# Patient Record
Sex: Female | Born: 1960 | Race: White | Hispanic: No | Marital: Married | Smoking: Current every day smoker
Health system: Southern US, Community
[De-identification: ages and names within clinical notes are randomized; demographics above are authoritative.]

## PROBLEM LIST (undated history)

## (undated) DIAGNOSIS — M179 Osteoarthritis of knee, unspecified: Secondary | ICD-10-CM

## (undated) DIAGNOSIS — I493 Ventricular premature depolarization: Secondary | ICD-10-CM

## (undated) DIAGNOSIS — M1710 Unilateral primary osteoarthritis, unspecified knee: Secondary | ICD-10-CM

## (undated) DIAGNOSIS — F17200 Nicotine dependence, unspecified, uncomplicated: Secondary | ICD-10-CM

## (undated) DIAGNOSIS — T4145XA Adverse effect of unspecified anesthetic, initial encounter: Secondary | ICD-10-CM

## (undated) DIAGNOSIS — G4700 Insomnia, unspecified: Secondary | ICD-10-CM

## (undated) DIAGNOSIS — M1830 Unilateral post-traumatic osteoarthritis of first carpometacarpal joint, unspecified hand: Secondary | ICD-10-CM

## (undated) DIAGNOSIS — F32A Depression, unspecified: Secondary | ICD-10-CM

## (undated) DIAGNOSIS — F329 Major depressive disorder, single episode, unspecified: Secondary | ICD-10-CM

## (undated) DIAGNOSIS — Z972 Presence of dental prosthetic device (complete) (partial): Secondary | ICD-10-CM

## (undated) DIAGNOSIS — N951 Menopausal and female climacteric states: Secondary | ICD-10-CM

## (undated) DIAGNOSIS — J209 Acute bronchitis, unspecified: Secondary | ICD-10-CM

## (undated) DIAGNOSIS — F419 Anxiety disorder, unspecified: Secondary | ICD-10-CM

## (undated) DIAGNOSIS — M2241 Chondromalacia patellae, right knee: Secondary | ICD-10-CM

## (undated) DIAGNOSIS — K047 Periapical abscess without sinus: Secondary | ICD-10-CM

## (undated) DIAGNOSIS — T8859XA Other complications of anesthesia, initial encounter: Secondary | ICD-10-CM

## (undated) HISTORY — DX: Anxiety disorder, unspecified: F41.9

## (undated) HISTORY — DX: Depression, unspecified: F32.A

## (undated) HISTORY — DX: Nicotine dependence, unspecified, uncomplicated: F17.200

## (undated) HISTORY — DX: Acute bronchitis, unspecified: J20.9

## (undated) HISTORY — DX: Unilateral post-traumatic osteoarthritis of first carpometacarpal joint, unspecified hand: M18.30

## (undated) HISTORY — DX: Major depressive disorder, single episode, unspecified: F32.9

## (undated) HISTORY — DX: Ventricular premature depolarization: I49.3

## (undated) HISTORY — DX: Insomnia, unspecified: G47.00

---
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
## 2005-04-09 HISTORY — PX: CHOLECYSTECTOMY: SHX55

## 2008-09-07 ENCOUNTER — Encounter: Payer: Self-pay | Admitting: Family Medicine

## 2008-09-07 LAB — CONVERTED CEMR LAB

## 2009-04-09 ENCOUNTER — Encounter: Payer: Self-pay | Admitting: Family Medicine

## 2009-06-14 ENCOUNTER — Encounter: Payer: Self-pay | Admitting: Cardiology

## 2009-07-19 ENCOUNTER — Encounter: Payer: Self-pay | Admitting: Cardiology

## 2009-08-11 ENCOUNTER — Encounter: Payer: Self-pay | Admitting: Cardiology

## 2009-11-28 DIAGNOSIS — R002 Palpitations: Secondary | ICD-10-CM

## 2009-11-29 DIAGNOSIS — F341 Dysthymic disorder: Secondary | ICD-10-CM

## 2009-11-30 ENCOUNTER — Encounter: Payer: Self-pay | Admitting: Cardiology

## 2009-11-30 ENCOUNTER — Ambulatory Visit: Payer: Self-pay | Admitting: Cardiology

## 2009-11-30 DIAGNOSIS — F17200 Nicotine dependence, unspecified, uncomplicated: Secondary | ICD-10-CM

## 2009-11-30 DIAGNOSIS — R072 Precordial pain: Secondary | ICD-10-CM | POA: Insufficient documentation

## 2009-11-30 HISTORY — DX: Nicotine dependence, unspecified, uncomplicated: F17.200

## 2009-12-06 ENCOUNTER — Encounter: Payer: Self-pay | Admitting: Cardiovascular Disease

## 2009-12-20 ENCOUNTER — Ambulatory Visit: Payer: Self-pay | Admitting: Diagnostic Radiology

## 2009-12-20 ENCOUNTER — Emergency Department (HOSPITAL_BASED_OUTPATIENT_CLINIC_OR_DEPARTMENT_OTHER): Admission: EM | Admit: 2009-12-20 | Discharge: 2009-12-20 | Payer: Self-pay | Admitting: Emergency Medicine

## 2009-12-23 ENCOUNTER — Encounter: Payer: Self-pay | Admitting: Family Medicine

## 2010-01-26 ENCOUNTER — Telehealth (INDEPENDENT_AMBULATORY_CARE_PROVIDER_SITE_OTHER): Payer: Self-pay | Admitting: *Deleted

## 2010-01-27 ENCOUNTER — Ambulatory Visit (HOSPITAL_COMMUNITY): Admission: RE | Admit: 2010-01-27 | Discharge: 2010-01-27 | Payer: Self-pay | Admitting: Cardiology

## 2010-01-27 ENCOUNTER — Ambulatory Visit: Payer: Self-pay

## 2010-01-27 ENCOUNTER — Encounter: Payer: Self-pay | Admitting: Cardiology

## 2010-01-27 ENCOUNTER — Ambulatory Visit: Payer: Self-pay | Admitting: Cardiovascular Disease

## 2010-02-10 ENCOUNTER — Encounter: Payer: Self-pay | Admitting: Family Medicine

## 2010-02-21 ENCOUNTER — Encounter: Payer: Self-pay | Admitting: Family Medicine

## 2010-03-15 ENCOUNTER — Encounter: Payer: Self-pay | Admitting: Cardiology

## 2010-03-15 ENCOUNTER — Ambulatory Visit: Payer: Self-pay | Admitting: Cardiology

## 2010-03-15 DIAGNOSIS — I4949 Other premature depolarization: Secondary | ICD-10-CM

## 2010-04-12 ENCOUNTER — Encounter: Payer: Self-pay | Admitting: Family Medicine

## 2010-04-19 ENCOUNTER — Ambulatory Visit: Admit: 2010-04-19 | Payer: Self-pay | Admitting: Cardiology

## 2010-05-05 ENCOUNTER — Encounter: Payer: Self-pay | Admitting: Family Medicine

## 2010-05-09 NOTE — Letter (Signed)
Summary: Generic Letter  Architectural technologist, Main Office  [address]   [place], [place] [address]   Phone: [phone]  Fax: [phone]        December 06, 2009 MRN: [id number]    Andrea Bowman [address] [place], [place]  [address]    Dear [name],        I am unable to reach you by phone. I wanted to let you know the blood work we checked was normal. We checked your sodium, potassium, kidney function and magnesium, they are all normal. Please call with any questions or concerns.    Sincerely,  Deliah Goody, RN/Dr Olga Millers

## 2010-05-09 NOTE — Progress Notes (Signed)
Summary: Stress Echo Pre-Procedure  Phone Note Outgoing Call   Call placed by: Antionette Char RN,  January 26, 2010 4:54 PM Call placed to: Patient Reason for Call: Confirm/change Appt Summary of Call: Left message on answering machine regarding Stress Echo instructions.

## 2010-05-09 NOTE — Letter (Signed)
Summary: [facility] Office Visit Note   [facility] Office Visit Note   Imported By: Roderic Ovens 12/07/2009 11:50:41  _____________________________________________________________________  External Attachment:    Type:   Image     Comment:   External Document

## 2010-05-09 NOTE — Cardiovascular Report (Signed)
Summary: Arrhythmia Monitoring Summary Report   Arrhythmia Monitoring Summary Report   Imported By: Roderic Ovens 12/07/2009 16:01:08  _____________________________________________________________________  External Attachment:    Type:   Image     Comment:   External Document

## 2010-05-09 NOTE — Letter (Signed)
Summary: [facility] Office Visit Note   [facility] Office Visit Note   Imported By: Roderic Ovens 12/07/2009 11:48:38  _____________________________________________________________________  External Attachment:    Type:   Image     Comment:   External Document

## 2010-05-09 NOTE — Assessment & Plan Note (Signed)
Summary: [facility]   Visit Type:  Initial Consult  CC:  irregular heart beat.  History of Present Illness: 50 year old female for evaluation of palpitations and arrhythmia. Patient apparently developed palpitations early this year. She was seen in [facility] [place] and had an exercise treadmill. She completed 9 minutes on the Bruce protocol and there was one PVC but no ST changes were noted. Apparently a Holter monitor and event monitor showed PVCs. Patient states that since she has had palpitations they are a "pause". There is associated tightness in her neck but there is no shortness of breath or syncope. She was placed on a beta blocker in [place] with some improvement but she continues to have symptoms. The patient also describes chest tightness with more extreme activities relieved with rest. She states it feels like "I can't take a deep breath". It resolves with rest. It does not occur with routine activities. There is no orthopnea, PND, pedal edema. There is no history of syncope.  Current Medications (verified): 1)  Metoprolol Succinate 50 Mg Xr24h-Tab (Metoprolol Succinate) .... Take One Tablet By Mouth Daily 2)  Clonazepam 0.5 Mg Tbdp (Clonazepam) .... Take 1 Tablet By Mouth Three Times A Day  Allergies (verified): No Known Drug Allergies  Past History:  Past Medical History: PVCs ANXIETY DEPRESSION   Past Surgical History: Chloecystectomy  Family History: Reviewed history from 11/28/2009 and no changes required. Father:deceased CHF Mother:breast cancer Siblings depression No premature CAD No sudden death  Social History: Reviewed history from 11/28/2009 and no changes required. Married  Tobacco Use - Yes.  Alcohol Use - no  Review of Systems       no fevers or chills, productive cough, hemoptysis, dysphasia, odynophagia, melena, hematochezia, dysuria, hematuria, rash, seizure activity, orthopnea, PND, pedal edema, claudication. Remaining systems are  negative.   Vital Signs:  Patient profile:   50 year old female Height:      67 inches Weight:      196.75 pounds BMI:     30.93 Pulse rate:   70 / minute Resp:     18 per minute BP sitting:   124 / 78  (right arm) Cuff size:   large  Vitals Entered By: Vikki Ports (November 30, 2009 3:55 PM)  Physical Exam  General:  Well developed/well nourished in NAD Skin warm/dry; tatoos Patient not depressed No peripheral clubbing Back-normal HEENT-normal/normal eyelids Neck supple/normal carotid upstroke bilaterally; no bruits; no JVD; no thyromegaly chest - CTA/ normal expansion CV - RRR/normal S1 and S2; no murmurs, rubs or gallops;  PMI nondisplaced Abdomen -NT/ND, no HSM, no mass, + bowel sounds, no bruit 2+ femoral pulses, no bruits Ext-no edema, chords, 2+ DP Neuro-grossly nonfocal     EKG  Procedure date:  11/30/2009  Findings:      Normal sinus rhythm at a rate of 63. Axis normal. No ST changes.  Impression & Recommendations:  Problem # 1:  PALPITATIONS (ICD-785.1) Previous evaluation has revealed PVCs. I explained that in the setting of normal LV function these are benign. I will check electrolytes and a TSH. I will plan to proceed with a stress echocardiogram to quantitate LV function. This will also want to exclude ischemia. She does describe chest tightness with more extreme activities relieved with rest. There may be a pulmonary component but we need to exclude cardiac contribution. We will continue with her beta blocker. I have also asked her to avoid caffeine. Her updated medication list for this problem includes:    Metoprolol  Succinate 50 Mg Xr24h-tab (Metoprolol succinate) .Marland Kitchen... Take one tablet by mouth daily  Orders: T-Basic Metabolic Panel [phone]) T-Magnesium [phone]) T-TSH [phone])  Problem # 2:  CHEST PAIN, PRECORDIAL (ICD-786.51) As per #1 we will plan a stress echocardiogram. Her updated medication list for this problem includes:     Metoprolol Succinate 50 Mg Xr24h-tab (Metoprolol succinate) .Marland Kitchen... Take one tablet by mouth daily  Orders: Stress Echo (Stress Echo)  Problem # 3:  TOBACCO ABUSE (ICD-305.1) Patient counseled on discontinuing for between 3-10 minutes.  Problem # 4:  ANXIETY DEPRESSION (ICD-300.4) Management per primary care.  Patient Instructions: 1)  Your physician recommends that you schedule a follow-up appointment in: 6 MONTHS 2)  Your physician has requested that you have a stress echocardiogram. For further information please visit [email or web address]  Please follow instruction sheet as given.

## 2010-05-09 NOTE — Assessment & Plan Note (Signed)
Summary: [facility]   Visit Type:  Follow-up  CC:  palpitations.  History of Present Illness: 50 year old female I saw in August of 2011 for evaluation of palpitations and arrhythmia. Patient apparently developed palpitations early this year. She was seen in [facility] [place] and had an exercise treadmill. She completed 9 minutes on the Bruce protocol and there was one PVC but no ST changes were noted. Apparently a Holter monitor and event monitor showed PVCs.  She was placed on a beta blocker in [place] with some improvement but she continued to have symptoms. We checked electrolytes and TSH which were normal. She was also complaining of chest tightness with extreme activities. A stress echocardiogram was therefore performed in October of 2011 and was normal. Since I last saw her she continues to have palpitations. They are described as a "flop". They are frequent and associated with dizziness. She denies dyspnea on exertion, orthopnea, PND, pedal edema or chest pain. Her palpitations are not sustained.   Current Medications (verified): 1)  Metoprolol Succinate 50 Mg Xr24h-Tab (Metoprolol Succinate) .... Take One Tablet By Mouth Daily 2)  Clonazepam 0.5 Mg Tbdp (Clonazepam) .... Take 1 Tablet By Mouth Three Times A Day 3)  Zoloft 25 Mg Tabs (Sertraline Hcl) .... Take 1 Tablet By Mouth Once A Day  Allergies (verified): 1)  ! Prednisone  Past History:  Past Medical History: Reviewed history from 11/30/2009 and no changes required. PVCs ANXIETY DEPRESSION   Past Surgical History: Reviewed history from 11/30/2009 and no changes required. Chloecystectomy  Social History: Reviewed history from 11/30/2009 and no changes required. Married  Tobacco Use - Yes.  Alcohol Use - no  Review of Systems       no fevers or chills, productive cough, hemoptysis, dysphasia, odynophagia, melena, hematochezia, dysuria, hematuria, rash, seizure activity, orthopnea, PND, pedal edema, claudication.  Remaining systems are negative.   Vital Signs:  Patient profile:   50 year old female Height:      67 inches Weight:      197.75 pounds BMI:     31.08 Pulse rate:   70 / minute Pulse rhythm:   irregular Resp:     18 per minute BP sitting:   110 / 70  (right arm) Cuff size:   large  Vitals Entered By: Vikki Ports (March 15, 2010 3:25 PM)  Physical Exam  General:  Well-developed well-nourished in no acute distress.  Skin is warm and dry.  HEENT is normal.  Neck is supple. No thyromegaly.  Chest is clear to auscultation with normal expansion.  Cardiovascular exam is regular rate and rhythm.  Abdominal exam nontender or distended. No masses palpated. Extremities show no edema. neuro grossly intact    EKG  Procedure date:  03/15/2010  Findings:      Sinus rhythm at a rate of 70. Occasional PVC.  Impression & Recommendations:  Problem # 1:  PALPITATIONS (ICD-785.1) Patient continues to have palpitations. She is taking Toprol 25 mg p.o. daily as 50 mg caused diarrhea. I will discontinue her Toprol and begin atenolol 25 mg p.o. b.i.d. to see if she tolerates this better. If her palpitations continue despite higher doses of beta blockade then we will need to consider flecainide. Note her LV function is normal. Previous TSH and electrolytes normal. Her updated medication list for this problem includes:    Metoprolol Succinate 50 Mg Xr24h-tab (Metoprolol succinate) .Marland Kitchen... Take one tablet by mouth daily  Her updated medication list for this problem includes:  Atenolol 25 Mg Tabs (Atenolol) .Marland Kitchen... Take one tablet by mouth two times a day  Problem # 2:  PREMATURE VENTRICULAR CONTRACTIONS (ICD-427.69) As per above. Her electrocardiogram showed a PVC today and she did have palpitations at that time. Her updated medication list for this problem includes:    Atenolol 25 Mg Tabs (Atenolol) .Marland Kitchen... Take one tablet by mouth two times a day  Problem # 3:  TOBACCO ABUSE  (ICD-305.1) Patient counseled on discontinuing.  Patient Instructions: 1)  Your physician has recommended you make the following change in your medication: STOP TOPROL 2)  START ATENOLOL 25MG  ONE TABLET TWICE DAILY 3)  Your physician recommends that you schedule a follow-up appointment in: 4 WEEKS Prescriptions: ATENOLOL 25 MG TABS (ATENOLOL) Take one tablet by mouth two times a day  #60 x 12   Entered by:   Deliah Goody, RN   Authorized by:   Ferman Hamming, MD, [facility]   Signed by:   Deliah Goody, RN on 03/15/2010   Method used:   Electronically to        [facility] [facility] [phone][facility] (retail)       [address]       [place], [place]  [address]       Ph: [phone] or [phone]       Fax: [phone]   RxID:   [id number]

## 2010-05-12 ENCOUNTER — Encounter: Payer: Self-pay | Admitting: Family Medicine

## 2010-05-12 ENCOUNTER — Institutional Professional Consult (permissible substitution) (INDEPENDENT_AMBULATORY_CARE_PROVIDER_SITE_OTHER): Payer: BC Managed Care – PPO | Admitting: Family Medicine

## 2010-05-12 DIAGNOSIS — Z9189 Other specified personal risk factors, not elsewhere classified: Secondary | ICD-10-CM | POA: Insufficient documentation

## 2010-05-12 DIAGNOSIS — R002 Palpitations: Secondary | ICD-10-CM

## 2010-05-12 DIAGNOSIS — F17200 Nicotine dependence, unspecified, uncomplicated: Secondary | ICD-10-CM

## 2010-05-12 DIAGNOSIS — J301 Allergic rhinitis due to pollen: Secondary | ICD-10-CM | POA: Insufficient documentation

## 2010-05-12 DIAGNOSIS — N959 Unspecified menopausal and perimenopausal disorder: Secondary | ICD-10-CM | POA: Insufficient documentation

## 2010-05-12 DIAGNOSIS — Z8619 Personal history of other infectious and parasitic diseases: Secondary | ICD-10-CM | POA: Insufficient documentation

## 2010-05-12 DIAGNOSIS — Z87448 Personal history of other diseases of urinary system: Secondary | ICD-10-CM | POA: Insufficient documentation

## 2010-05-12 DIAGNOSIS — E559 Vitamin D deficiency, unspecified: Secondary | ICD-10-CM | POA: Insufficient documentation

## 2010-05-12 DIAGNOSIS — F341 Dysthymic disorder: Secondary | ICD-10-CM

## 2010-05-15 ENCOUNTER — Telehealth: Payer: Self-pay | Admitting: Family Medicine

## 2010-05-17 NOTE — Assessment & Plan Note (Signed)
Summary: patient wants to consult with doctor to establish   Vital Signs:  Patient profile:   50 year old female Menstrual status:  irregular LMP:     05/09/2010 Height:      66.8 inches (169.67 cm) Weight:      194.25 pounds (88.30 kg) O2 Sat:      98 % on Room air Temp:     97.6 degrees F (36.44 degrees C) oral Pulse rate:   57 / minute BP sitting:   106 / 68  (right arm) Cuff size:   large  Vitals Entered By: Josph Macho RMA (May 12, 2010 1:35 PM)  O2 Flow:  Room air CC: Establish new patient/ CF Is Patient Diabetic? No LMP (date): 05/09/2010     Menstrual Status irregular Enter LMP: 05/09/2010 Last PAP Result historical   History of Present Illness: 50 year old Caucasian female who is in today to have his care. She has primarily relocated here from [place] been in town since the summer and is in need of a primary care physician. She reports a long-standing history of depression anxiety and panic disorder but this is generally well-controlled on her low dose sertraline and she uses clonazepam only infrequently. She did have a bronchitis a couple months ago but those symptoms have completely resolved. She does have frequent PVCs often feels that is following with Dr. Jens Som of cardiology and is comfortable with the benign nature of her palpitations at this time. She has no associated symptoms such as chest pain or shortness of breath when they occur. She says overall she feels well other than bronchitis no recent illness, fevers, chills, congestion, cough, GI or GU complaints. She says her last physical exam was over a year ago where she had a Pap and mammogram done in May. She does believe she had a low vitamin D was told to supplement her thousand units daily which she continues to do. Has not had the vitamin D rechecked. She had an episode recently where she has some erythema and bruising over her left thenar prominence no cause was ever found and it did resolve. She's had  no other similar lesions. Did have some lab work run by a Programmer, multimedia and will have those records forwarded. Patient is noting some perimenopausal symptoms over the last several months. She notes her menses which he previously has been regular now irregular. She will have 2 periods one month and then nonfocal month she's also having a lot of hot flashes and night sweats. Some irritability and difficulty with sleeping are also noted  Preventive Screening-Counseling & Management  Alcohol-Tobacco     Smoking Status: current     Smoking Cessation Counseling: YES      Drug Use:  no.    Current Medications (verified): 1)  Atenolol 25 Mg Tabs (Atenolol) .... Take One Tablet By Mouth Two Times A Day 2)  Clonazepam 0.5 Mg Tbdp (Clonazepam) .... Take 1 Tablet By Mouth Three Times A Day 3)  Zoloft 25 Mg Tabs (Sertraline Hcl) .... Take 1 Tablet By Mouth Once A Day  Allergies (verified): 1)  ! Prednisone  Past History:  Past Surgical History: Chloecystectomy childhood abdominal surgery at age 89yo verrucous lesion on right scalp excise, benign  Family History:  No premature CAD No sudden death Father: deceased@76 , CHF, HTN Mother: deceased@50 , breast cancer Siblings:  Brother: 52, depression, anxiety, PUD Sister: 48, A&W, previous h/o depression/anxiety MGM: deceased in 44s, cancer MGF: deceased unknown PGM: deceased in 70s,  unknown causes PGF: deceased in 69s, unknown causes Children: Daughter: 57, A&W several maternal aunts with breast cancer, various ages several paternal uncles with prostate cancer  Social History: Married  Tobacco Use - Yes.  Alcohol Use - no, very special occasions Current Smoker, 1/2 ppd, previously tried Chantix, tried nicotine replacements Drug use-no Occupation: Engineer, production No dietary restrictionsDrug Use:  no Occupation:  employed  Review of Systems       The patient complains of depression.  The patient denies anorexia,  fever, weight loss, weight gain, vision loss, decreased hearing, hoarseness, chest pain, syncope, dyspnea on exertion, peripheral edema, prolonged cough, headaches, hemoptysis, abdominal pain, melena, hematochezia, severe indigestion/heartburn, hematuria, incontinence, genital sores, muscle weakness, suspicious skin lesions, transient blindness, difficulty walking, unusual weight change, abnormal bleeding, and enlarged lymph nodes.    Physical Exam  General:  Well-developed,well-nourished,in no acute distress; alert,appropriate and cooperative throughout examination Head:  Normocephalic and atraumatic without obvious abnormalities. No apparent alopecia or balding. Eyes:  No corneal or conjunctival inflammation noted. EOMI. Perrla. Funduscopic exam benign, without hemorrhages, exudates or papilledema. Vision grossly normal. Ears:  External ear exam shows no significant lesions or deformities.  Otoscopic examination reveals clear canals, tympanic membranes are intact bilaterally without bulging, retraction, inflammation or discharge. Hearing is grossly normal bilaterally. Nose:  External nasal examination shows no deformity or inflammation. Nasal mucosa are pink and moist without lesions or exudates. Mouth:  Oral mucosa and oropharynx without lesions or exudates.   Neck:  No deformities, masses, or tenderness noted. Lungs:  Normal respiratory effort, chest expands symmetrically. Lungs are clear to auscultation, no crackles or wheezes. Heart:  Normal rate and regular rhythm. S1 and S2 normal without gallop, murmur, click, rub or other extra sounds. Abdomen:  Bowel sounds positive,abdomen soft and non-tender without masses, organomegaly or hernias noted. Msk:  No deformity or scoliosis noted of thoracic or lumbar spine.   Pulses:  R and L carotid,dorsalis pedis and posterior tibial pulses are full and equal bilaterally Extremities:  No clubbing, cyanosis, edema, or deformity noted with normal full range  of motion of all joints.   Neurologic:  No cranial nerve deficits noted. Station and gait are normal. Plantar reflexes are down-going bilaterally. DTRs are symmetrical throughout. Sensory, motor and coordinative functions appear intact. Skin:  Intact without suspicious lesions or rashes Cervical Nodes:  No lymphadenopathy noted Psych:  Cognition and judgment appear intact. Alert and cooperative with normal attention span and concentration. No apparent delusions, illusions, hallucinations   Impression & Recommendations:  Problem # 1:  UNSPECIFIED VITAMIN D DEFICIENCY (ICD-268.9) will request old records and review old records then likely order a repeat Vit D level at next visit. Cont vit D 1000iu daily for now  Problem # 2:  PERIMENOPAUSAL SYNDROME (ICD-627.9) Needs 7-8 hours of sleep, regular am exercise, avoid heavy carb meals and do not skip meals. Alternate a fish oil cap every other day with a vit B complex every other day and consider labs at next visit if no improvement.  Problem # 3:  ALLERGIC RHINITIS, SEASONAL (ICD-477.0) No sympcomts yet but may use Cetirizine as needed as needed for new symptoms  Problem # 4:  TOBACCO ABUSE (ICD-305.1)  Orders: Tobacco use cessation intermediate 3-10 minutes (99406) Counselled at length regarding the need to attempt complete cessation, especially given her FH of [place].  Problem # 5:  ANXIETY DEPRESSION (ICD-300.4) Patient reports symptoms are stable, am willing to continue her meds for her at present  doses as long as her symptoms remain stable  Complete Medication List: 1)  Atenolol 25 Mg Tabs (Atenolol) .... Take one tablet by mouth two times a day 2)  Clonazepam 0.5 Mg Tbdp (Clonazepam) .... Take 1 tablet by mouth three times a day 3)  Zoloft 25 Mg Tabs (Sertraline hcl) .... Take 1 tablet by mouth once a day 4)  Vitamin D 1000 Unit Tabs (Cholecalciferol) .Marland Kitchen.. 1 tab by mouth daily  Patient Instructions: 1)  Please schedule a follow-up  appointment in 1 month GYN 2)  Release of Records [facility] 3)  Start a fish oil cap every other day and a Vitamin B complex every other day, get 7-8 hours sleep nightly, add regular exercise 4)  Tobacco is very bad for your health and your loved ones ! You should stop smoking !  5)  Stop smoking tips: Choose a quit date. Cut down before the quit date. Decide what you will do as a substitute when you feel the urge to smoke(gum, toothpick, exercise).  6)  It is important that you exercise reguarly at least 20 minutes 5 times a week. If you develop chest pain, have severe difficulty breathing, or feel very tired, stop exercising immediately and seek medical attention.    Orders Added: 1)  Tobacco use cessation intermediate 3-10 minutes [99406] 2)  New Patient Level IV [40981]    Preventive Care Screening  Mammogram:    Date:  04/09/2009    Results:  historical   Pap Smear:    Date:  04/09/2009    Results:  historical

## 2010-05-22 ENCOUNTER — Encounter: Payer: Self-pay | Admitting: *Deleted

## 2010-05-25 ENCOUNTER — Other Ambulatory Visit: Payer: Self-pay | Admitting: Family Medicine

## 2010-05-25 DIAGNOSIS — Z1231 Encounter for screening mammogram for malignant neoplasm of breast: Secondary | ICD-10-CM

## 2010-05-25 NOTE — Progress Notes (Signed)
----   Converted from flag ---- ---- 05/12/2010 3:08 PM, Danise Edge MD wrote: Help this lady wants her screening MGM in [place], any suggestions? ------------------------------ SW patient, gave her [facility] scheduler phone [phone], pt can set up screening Mammo at any of their locations, [place] included-Diane

## 2010-05-31 NOTE — Letter (Signed)
Summary: 2006-2011  2006-2011   Imported By: Lester Galion 05/23/2010 07:17:09  _____________________________________________________________________  External Attachment:    Type:   Image     Comment:   External Document

## 2010-06-07 ENCOUNTER — Encounter: Payer: Self-pay | Admitting: Cardiology

## 2010-06-07 ENCOUNTER — Ambulatory Visit (INDEPENDENT_AMBULATORY_CARE_PROVIDER_SITE_OTHER): Payer: BC Managed Care – PPO | Admitting: Cardiology

## 2010-06-07 DIAGNOSIS — R002 Palpitations: Secondary | ICD-10-CM

## 2010-06-09 ENCOUNTER — Other Ambulatory Visit (HOSPITAL_COMMUNITY)
Admission: RE | Admit: 2010-06-09 | Discharge: 2010-06-09 | Disposition: A | Discharge status: Home / Self Care | Payer: BC Managed Care – PPO | Source: Ambulatory Visit | Attending: Family Medicine | Admitting: Family Medicine

## 2010-06-09 ENCOUNTER — Ambulatory Visit (INDEPENDENT_AMBULATORY_CARE_PROVIDER_SITE_OTHER): Payer: BC Managed Care – PPO | Admitting: Family Medicine

## 2010-06-09 ENCOUNTER — Other Ambulatory Visit: Payer: Self-pay | Admitting: Family Medicine

## 2010-06-09 ENCOUNTER — Encounter: Payer: Self-pay | Admitting: Family Medicine

## 2010-06-09 DIAGNOSIS — E559 Vitamin D deficiency, unspecified: Secondary | ICD-10-CM

## 2010-06-09 DIAGNOSIS — Z01419 Encounter for gynecological examination (general) (routine) without abnormal findings: Secondary | ICD-10-CM | POA: Insufficient documentation

## 2010-06-09 DIAGNOSIS — R002 Palpitations: Secondary | ICD-10-CM

## 2010-06-09 DIAGNOSIS — N959 Unspecified menopausal and perimenopausal disorder: Secondary | ICD-10-CM

## 2010-06-09 DIAGNOSIS — J301 Allergic rhinitis due to pollen: Secondary | ICD-10-CM

## 2010-06-12 ENCOUNTER — Encounter: Payer: Self-pay | Admitting: *Deleted

## 2010-06-15 NOTE — Assessment & Plan Note (Signed)
Summary: F/U 1 MONTH   Vital Signs:  Patient profile:   50 year old female Menstrual status:  irregular Height:      66.8 inches (169.67 cm) Weight:      199.50 pounds (90.68 kg) BMI:     31.55 O2 Sat:      93 % on Room air Temp:     98.1 degrees F (36.72 degrees C) oral Pulse rate:   62 / minute BP sitting:   110 / 76  (right arm) Cuff size:   large  Vitals Entered By: Josph Macho RMA (June 09, 2010 8:28 AM)  O2 Flow:  Room air CC: Pap smear/ CF Is Patient Diabetic? No Menarche (age onset years): 13   Menses interval (days): variable Menstrual flow (days): 6 days Last PAP Result historical   Current Medications (verified): 1)  Atenolol 25 Mg Tabs (Atenolol) .... Take Two Tablets Every Am and One Tablet Every Pm 2)  Clonazepam 0.5 Mg Tbdp (Clonazepam) .... Take 1 Tablet By Mouth Three Times A Day. As Needed ' 3)  Zoloft 25 Mg Tabs (Sertraline Hcl) .... Take 1 Tablet By Mouth Once A Day 4)  Vitamin D 1000 Unit Tabs (Cholecalciferol) .Marland Kitchen.. 1 Tab By Mouth Daily 5)  Fish Oil 1000 Mg Caps (Omega-3 Fatty Acids) .... Take 1 Capsule By Mouth Once A Day 6)  B-100  Tabs (Vitamins-Lipotropics) .... Every Other Day  Allergies (verified): 1)  ! Prednisone  Past History:  Past medical history reviewed for relevance to current acute and chronic problems. Social history (including risk factors) reviewed for relevance to current acute and chronic problems.  Past Medical History: Reviewed history from 11/30/2009 and no changes required. PVCs ANXIETY DEPRESSION   Social History: Reviewed history from 05/12/2010 and no changes required. Married  Tobacco Use - Yes.  Alcohol Use - no, very special occasions Current Smoker, 1/2 ppd, previously tried Chantix, tried nicotine replacements Drug use-no Occupation: Transport planner belt No dietary restrictions  Review of Systems      See HPI  Physical Exam  General:  Well-developed,well-nourished,in no acute  distress; alert,appropriate and cooperative throughout examination Head:  Normocephalic and atraumatic without obvious abnormalities. No apparent alopecia or balding. Mouth:  Oral mucosa and oropharynx without lesions or exudates.  Teeth in good repair. Neck:  No deformities, masses, or tenderness noted. Breasts:  No mass, nodules, thickening, tenderness, bulging, retraction, inflamation, nipple discharge or skin changes noted.   Lungs:  Normal respiratory effort, chest expands symmetrically. Lungs are clear to auscultation, no crackles or wheezes. Heart:  Normal rate and regular rhythm. S1 and S2 normal without gallop, murmur, click, rub or other extra sounds. Abdomen:  Bowel sounds positive,abdomen soft and non-tender without masses, organomegaly or hernias noted. Rectal:  no external abnormalities and no hemorrhoids.   Genitalia:  Normal introitus for age, no external lesions, no vaginal discharge, mucosa pink and moist, no vaginal or cervical lesions, no vaginal atrophy, no friaility or hemorrhage, normal uterus size and position, no adnexal masses or tenderness Extremities:  No clubbing, cyanosis, edema, or deformity noted with normal full range of motion of all joints.   Cervical Nodes:  No lymphadenopathy noted Psych:  Cognition and judgment appear intact. Alert and cooperative with normal attention span and concentration. No apparent delusions, illusions, hallucinations   Impression & Recommendations:  Problem # 1:  PERIMENOPAUSAL SYNDROME (ICD-627.9) avoid simple carbs, avoid hot liquids, caffeine and alcohol, start regular exercise in am and consider 1 cap  of evening primrose oil if symptoms persist. Pap smear taken today  Problem # 2:  UNSPECIFIED VITAMIN D DEFICIENCY (ICD-268.9) Reviewed old labs from previous pmd Vit d level was 27, encouraged her to start Citracal daily recheck vit d with next blood draw.  Problem # 3:  PALPITATIONS (ICD-785.1)  Her updated medication list for  this problem includes:    Atenolol 25 Mg Tabs (Atenolol) .Marland Kitchen... Take two tablets every am and one tablet every pm Improved with addition of Atenolol, was seen by cardiology yesterday and we will continue to titrate dosing. Report worsening symptoms  Problem # 4:  ALLERGIC RHINITIS, SEASONAL (ICD-477.0) No symptoms at present, may use Zyrtec daily  Complete Medication List: 1)  Atenolol 25 Mg Tabs (Atenolol) .... Take two tablets every am and one tablet every pm 2)  Clonazepam 0.5 Mg Tbdp (Clonazepam) .... Take 1 tablet by mouth three times a day. as needed ' 3)  Zoloft 25 Mg Tabs (Sertraline hcl) .... Take 1 tablet by mouth once a day 4)  Vitamin D 1000 Unit Tabs (Cholecalciferol) .Marland Kitchen.. 1 tab by mouth daily 5)  Fish Oil 1000 Mg Caps (Omega-3 fatty acids) .... Take 1 capsule by mouth once a day 6)  B-100 Tabs (Vitamins-lipotropics) .... Every other day  Patient Instructions: 1)  Please schedule a follow-up appointment in 3  months 2)  FLP, liver, renal, cbc, vit d, tsh, call the week before the visit for lab appt. 3)  Minimize caffein   Orders Added: 1)  Est. Patient Level IV [21308]       WOMEN'S HEALTH      G: 2  P: 1  Living: 1  Term: 1             Menarche age: 79.             Menstrual frequency: variable.  Menstrual duration: 6 days.  Flow: moderate.       Last PAP smear: Results: historical      Last Mammogram: Results: historical      Comments on pregnancies: G1P1, s/p svd s/p 1 miscarriage required d/c no abnl pap abnl mgm never required a bx,

## 2010-06-15 NOTE — Assessment & Plan Note (Signed)
Summary: [facility]   Visit Type:  Follow-up  CC:  No complaints.  History of Present Illness: 50 year old female I saw in August of 2011 for evaluation of palpitations and arrhythmia. Patient apparently developed palpitations early this year. She was seen in [facility] [place] and had an exercise treadmill. She completed 9 minutes on the Bruce protocol and there was one PVC but no ST changes were noted. Apparently a Holter monitor and event monitor showed PVCs.  She was placed on a beta blocker in [place] with some improvement but she continued to have symptoms. We checked electrolytes and TSH which were normal. She was also complaining of chest tightness with extreme activities. A stress echocardiogram was therefore performed in October of 2011 and was normal. When I last saw her in December of 2011 I discontinued her Toprol secondary to diarrhea and begin atenolol. Since then, her palpitations have improved. She will occasionally feel the in the late afternoon. They continue to be described as a skip. They are not sustained. There is no dyspnea, chest pain or syncope.  Current Medications (verified): 1)  Atenolol 25 Mg Tabs (Atenolol) .... Take One Tablet By Mouth Two Times A Day 2)  Clonazepam 0.5 Mg Tbdp (Clonazepam) .... Take 1 Tablet By Mouth Three Times A Day. As Needed ' 3)  Zoloft 25 Mg Tabs (Sertraline Hcl) .... Take 1 Tablet By Mouth Once A Day 4)  Vitamin D 1000 Unit Tabs (Cholecalciferol) .Marland Kitchen.. 1 Tab By Mouth Daily 5)  Fish Oil 1000 Mg Caps (Omega-3 Fatty Acids) .... Take 1 Capsule By Mouth Once A Day 6)  B-100  Tabs (Vitamins-Lipotropics) .... Every Other Day  Allergies: 1)  ! Prednisone  Past History:  Past Medical History: Reviewed history from 11/30/2009 and no changes required. PVCs ANXIETY DEPRESSION   Past Surgical History: Reviewed history from 05/12/2010 and no changes required. Chloecystectomy childhood abdominal surgery at age 50yo verrucous lesion on right  scalp excise, benign  Social History: Reviewed history from 05/12/2010 and no changes required. Married  Tobacco Use - Yes.  Alcohol Use - no, very special occasions Current Smoker, 1/2 ppd, previously tried Chantix, tried nicotine replacements Drug use-no Occupation: Transport planner belt No dietary restrictions  Review of Systems       no fevers or chills, productive cough, hemoptysis, dysphasia, odynophagia, melena, hematochezia, dysuria, hematuria, rash, seizure activity, orthopnea, PND, pedal edema, claudication. Remaining systems are negative.   Vital Signs:  Patient profile:   50 year old female Menstrual status:  irregular Height:      66.8 inches Weight:      198.50 pounds BMI:     31.39 Pulse rate:   57 / minute Pulse rhythm:   regular Resp:     18 per minute BP sitting:   110 / 69  (right arm) Cuff size:   large  Vitals Entered By: Vikki Ports (June 07, 2010 2:41 PM)  Physical Exam  General:  Well-developed well-nourished in no acute distress.  Skin is warm and dry.  HEENT is normal.  Neck is supple. No thyromegaly.  Chest is clear to auscultation with normal expansion.  Cardiovascular exam is regular rate and rhythm.  Abdominal exam nontender or distended. No masses palpated. Extremities show no edema. neuro grossly intact    EKG  Procedure date:  06/07/2010  Findings:      Sinus with no ST changes.  Impression & Recommendations:  Problem # 1:  PREMATURE VENTRICULAR CONTRACTIONS (ICD-427.69) Patient's symptoms have  improved although still having some in the late afternoon. Increase atenolol to 50 mg in the morning and 25 in the evening. Watch blood pressure and if it drops on this dose we will resume 25 b.i.d. Her updated medication list for this problem includes:    Atenolol 25 Mg Tabs (Atenolol) .Marland Kitchen... Take two tablets every am and one tablet every pm  Problem # 2:  TOBACCO ABUSE (ICD-305.1) Patient counseled on  discontinuing.  Problem # 3:  PALPITATIONS (ICD-785.1) As per #1. Her updated medication list for this problem includes:    Atenolol 25 Mg Tabs (Atenolol) .Marland Kitchen... Take two tablets every am and one tablet every pm  Patient Instructions: 1)  Your physician has recommended you make the following change in your medication: INCREASE ATENOLOL 25MG  TWO TABLETS EVERY AM AND ONE TABLET EVERY PM 2)  Your physician wants you to follow-up in: 6 MONTHS  You will receive a reminder letter in the mail two months in advance. If you don't receive a letter, please call our office to schedule the follow-up appointment. Prescriptions: ATENOLOL 25 MG TABS (ATENOLOL) Take two tablets every am and one tablet every pm  #90 x 12   Entered by:   Deliah Goody, RN   Authorized by:   Ferman Hamming, MD, [facility]   Signed by:   Deliah Goody, RN on 06/07/2010   Method used:   [facility]  [address] [phone]ail[facility]       [address]       [place], [place]  [address]       Ph: [phone] or [phone]       Fax: [phone]   RxID:   [id number]

## 2010-06-20 ENCOUNTER — Ambulatory Visit: Payer: BC Managed Care – PPO

## 2010-06-27 ENCOUNTER — Ambulatory Visit
Admission: RE | Admit: 2010-06-27 | Discharge: 2010-06-27 | Disposition: A | Discharge status: Home / Self Care | Payer: BC Managed Care – PPO | Source: Ambulatory Visit | Attending: Family Medicine | Admitting: Family Medicine

## 2010-06-27 DIAGNOSIS — Z1231 Encounter for screening mammogram for malignant neoplasm of breast: Secondary | ICD-10-CM

## 2010-06-27 NOTE — Letter (Signed)
Summary: [facility] @ [facility]   Imported By: Lanelle Bal 06/21/2010 13:42:02  _____________________________________________________________________  External Attachment:    Type:   Image     Comment:   External Document

## 2010-06-27 NOTE — Letter (Signed)
Summary: [facility] @ [facility]   Imported By: Lanelle Bal 06/21/2010 13:39:27  _____________________________________________________________________  External Attachment:    Type:   Image     Comment:   External Document

## 2010-06-27 NOTE — Letter (Signed)
Summary: [facility] @ [facility]   Imported By: Lanelle Bal 06/21/2010 13:42:51  _____________________________________________________________________  External Attachment:    Type:   Image     Comment:   External Document

## 2010-06-27 NOTE — Letter (Signed)
Summary: [facility] @ [facility]   Imported By: Lanelle Bal 06/21/2010 13:41:03  _____________________________________________________________________  External Attachment:    Type:   Image     Comment:   External Document

## 2010-06-30 ENCOUNTER — Other Ambulatory Visit: Payer: Self-pay | Admitting: Family Medicine

## 2010-06-30 DIAGNOSIS — R928 Other abnormal and inconclusive findings on diagnostic imaging of breast: Secondary | ICD-10-CM

## 2010-07-04 ENCOUNTER — Telehealth: Payer: Self-pay | Admitting: Family Medicine

## 2010-07-04 NOTE — Telephone Encounter (Signed)
So MGM showed some calcifications that the imaging center felt needed additional views to further evaluate. She had an appt on 3/23 that she missed, she just needs to call and reschedule because until we get additional views we cannot tell anything further.

## 2010-07-04 NOTE — Telephone Encounter (Signed)
Patient wants a call with her mammogram results.

## 2010-07-05 NOTE — Telephone Encounter (Signed)
Pt informed

## 2010-07-05 NOTE — Telephone Encounter (Signed)
Reviewed [facility], both refer to right sided calcifications, but neither clarifies exactly where and what size they are. I recommend she go for further imaging to be sure it is not serious

## 2010-07-05 NOTE — Telephone Encounter (Signed)
Pt would like to know if this is the same location as her results in [place]?

## 2010-07-10 ENCOUNTER — Ambulatory Visit
Admission: RE | Admit: 2010-07-10 | Discharge: 2010-07-10 | Disposition: A | Discharge status: Home / Self Care | Payer: BC Managed Care – PPO | Source: Ambulatory Visit | Attending: Family Medicine | Admitting: Family Medicine

## 2010-07-10 DIAGNOSIS — R928 Other abnormal and inconclusive findings on diagnostic imaging of breast: Secondary | ICD-10-CM

## 2010-07-27 ENCOUNTER — Telehealth: Payer: Self-pay

## 2010-07-27 ENCOUNTER — Telehealth: Payer: Self-pay | Admitting: Cardiology

## 2010-07-27 MED ORDER — NAPROXEN 500 MG PO TABS: 500.0000 mg | ORAL_TABLET | Freq: Two times a day (BID) | ORAL | Status: DC

## 2010-07-27 MED ORDER — CYCLOBENZAPRINE HCL 10 MG PO TABS: 10.0000 mg | ORAL_TABLET | Freq: Three times a day (TID) | ORAL | Status: DC | PRN

## 2010-07-27 NOTE — Telephone Encounter (Signed)
Pt given cyclobenzaprine 10 mg 1 tab three times a day -muscle relaxer for her back-warning on med said not to take if have irreg heartbeat and pt has hx of pvc's, is this ok for her to take?

## 2010-07-27 NOTE — Telephone Encounter (Signed)
Pt states she hurt lower back working out on Monday 07-23-09. Pt states she is going to see the chiropractor in [facility]. Pt states she is doing Advil every 4-6 hours but its only taking the edge off. Pt would like a muscle relaxer and a pain med called into pharmacy ([facility])

## 2010-07-27 NOTE — Telephone Encounter (Signed)
Pt informed and naproxen and cyclobenzaprine sent to pharmacy

## 2010-07-27 NOTE — Telephone Encounter (Signed)
Spoke with pt, okay given for pt to take flexeril Andrea Bowman

## 2010-07-27 NOTE — Telephone Encounter (Signed)
Would have her stop Advil and try Naproxen 500mg  po bid with food x 7 days and then as needed, call in #60 with 1 rf if she agrees. For muscle relaxer give her Cyclobenzaprine 10mg  po tid prn pain, (#40, 1rf) understanding it can cause sedation so initially she should only take it qhs or if she does not have anywhere to go she can take it earlier in the day as well. Can also alternate heat and ice and keep moving as much as tolerated. If still not improving can call in a little Tramadol to use only for severe pain 50mg  tabs 1 tab po bid prn pain, may cause sedation also (#20, 0rf).

## 2010-07-30 ENCOUNTER — Other Ambulatory Visit: Payer: Self-pay | Admitting: Family Medicine

## 2010-08-23 ENCOUNTER — Ambulatory Visit (INDEPENDENT_AMBULATORY_CARE_PROVIDER_SITE_OTHER): Payer: BC Managed Care – PPO | Admitting: Family Medicine

## 2010-08-23 ENCOUNTER — Encounter: Payer: Self-pay | Admitting: Family Medicine

## 2010-08-23 DIAGNOSIS — J019 Acute sinusitis, unspecified: Secondary | ICD-10-CM

## 2010-08-23 DIAGNOSIS — M19249 Secondary osteoarthritis, unspecified hand: Secondary | ICD-10-CM

## 2010-08-23 DIAGNOSIS — F17200 Nicotine dependence, unspecified, uncomplicated: Secondary | ICD-10-CM

## 2010-08-23 DIAGNOSIS — M1830 Unilateral post-traumatic osteoarthritis of first carpometacarpal joint, unspecified hand: Secondary | ICD-10-CM

## 2010-08-23 DIAGNOSIS — J301 Allergic rhinitis due to pollen: Secondary | ICD-10-CM

## 2010-08-23 DIAGNOSIS — J329 Chronic sinusitis, unspecified: Secondary | ICD-10-CM

## 2010-08-23 DIAGNOSIS — F411 Generalized anxiety disorder: Secondary | ICD-10-CM

## 2010-08-23 DIAGNOSIS — J309 Allergic rhinitis, unspecified: Secondary | ICD-10-CM

## 2010-08-23 DIAGNOSIS — J302 Other seasonal allergic rhinitis: Secondary | ICD-10-CM

## 2010-08-23 DIAGNOSIS — F419 Anxiety disorder, unspecified: Secondary | ICD-10-CM

## 2010-08-23 MED ORDER — TRAMADOL HCL 50 MG PO TABS: 50.0000 mg | ORAL_TABLET | Freq: Three times a day (TID) | ORAL | Status: DC | PRN

## 2010-08-23 MED ORDER — LEVOFLOXACIN 500 MG PO TABS: 500.0000 mg | ORAL_TABLET | Freq: Every day | ORAL | Status: AC

## 2010-08-23 MED ORDER — ALIGN 4 MG PO CAPS: 1.0000 | ORAL_CAPSULE | Freq: Every day | ORAL | Status: DC

## 2010-08-23 MED ORDER — CLONAZEPAM 0.5 MG PO TABS: 0.5000 mg | ORAL_TABLET | Freq: Three times a day (TID) | ORAL | Status: DC | PRN

## 2010-08-23 MED ORDER — LORATADINE 10 MG PO TABS: 10.0000 mg | ORAL_TABLET | Freq: Every day | ORAL | Status: DC

## 2010-08-23 NOTE — Patient Instructions (Signed)

## 2010-08-24 ENCOUNTER — Encounter: Payer: Self-pay | Admitting: Family Medicine

## 2010-08-24 DIAGNOSIS — M1830 Unilateral post-traumatic osteoarthritis of first carpometacarpal joint, unspecified hand: Secondary | ICD-10-CM

## 2010-08-24 DIAGNOSIS — J209 Acute bronchitis, unspecified: Secondary | ICD-10-CM

## 2010-08-24 HISTORY — DX: Acute bronchitis, unspecified: J20.9

## 2010-08-24 HISTORY — DX: Unilateral post-traumatic osteoarthritis of first carpometacarpal joint, unspecified hand: M18.30

## 2010-08-24 NOTE — Assessment & Plan Note (Signed)
Unfortunately continues to smoke daily, counseled for 3 minutes regarding the need to quit to improve her sinus disease, patient expresses understanding but is noncommital

## 2010-08-24 NOTE — Assessment & Plan Note (Signed)
Encouraged daily OTC non sedating antihistamine such as Loratadine, rx provided

## 2010-08-24 NOTE — Assessment & Plan Note (Signed)
Injured her thumb several months ago and is now struggling with intermittent stiffness and pain, suspect early arthritic changes. Encouraged ice and Aspercreme topically prn and notify us if symptoms worsen

## 2010-08-24 NOTE — Progress Notes (Signed)
Andrea Bowman [id number] [birth year] 08/24/2010      Progress Note-Follow Up  Subjective  Chief Complaint  Chief Complaint  Patient presents with  . Nasal Congestion    X 2 weeks    HPI  Patient is in with complaints of left-sided facial pain for the last couple weeks. She describes maxillary pressure with some congestion, sneezing, pruritus and itchy throat. Initially she presented to her dentist concerned that she was having some difficulty with her teeth secondary to a bridge in that region. Her dentist felt it was more of a sinus and recommended she come here. She denies fevers chills but does have some malaise and fatigue. She denies cough, chest pain, palpitations, shortness of breath, GI or GU complaints. She is complaining of some pain at the base of her left thumb it comes and goes on she does wear splint occasion it somewhat helpful she denies any erythema warmth or redness. No numbness or tingling  Past Medical History  Diagnosis Date  . PVC (premature ventricular contraction)   . Depression   . Anxiety   . UTI'S, HX OF 05/12/2010  . Unspecified vitamin D deficiency 05/12/2010  . TOBACCO ABUSE 11/30/2009  . PREMATURE VENTRICULAR CONTRACTIONS 03/15/2010  . PERIMENOPAUSAL SYNDROME 05/12/2010  . Palpitations 11/28/2009  . MUMPS, HX OF 05/12/2010  . HEMATURIA, HX OF 05/12/2010  . CHICKENPOX, HX OF 05/12/2010  . ANXIETY DEPRESSION 11/29/2009  . ALLERGIC RHINITIS, SEASONAL 05/12/2010  . Sinusitis acute 08/24/2010    Past Surgical History  Procedure Date  . Cholecystectomy   . Abdominal surgery 50 yrs old  . Verrucous lesion      right scalp excise, benign    Family History  Problem Relation Age of Onset  . Cancer Mother     Breast  . Hypertension Father   . Other Father     CHF  . Depression Sister     previous history of  . Anxiety disorder Sister     Previous history of  . Depression Brother   . Anxiety disorder Brother   . Ulcers Brother     PUD  . Cancer Maternal Aunt      X several w/ breast cancer, various ages  . Cancer Paternal Uncle     X several w/ prostate cancer  . Cancer Maternal Grandmother   . COPD Neg Hx     History   Social History  . Marital Status: Married    Spouse Name: N/A    Number of Children: N/A  . Years of Education: N/A   Occupational History  . Not on file.   Social History Main Topics  . Smoking status: Current Everyday Smoker -- 0.5 packs/day  . Smokeless tobacco: Never Used  . Alcohol Use: No  . Drug Use: No  . Sexually Active: Yes -- Female partner(s)   Other Topics Concern  . Not on file   Social History Narrative  . No narrative on file    Current Outpatient Prescriptions on File Prior to Visit  Medication Sig Dispense Refill  . atenolol (TENORMIN) 25 MG tablet Take 25 mg by mouth 2 (two) times daily.        . cyclobenzaprine (FLEXERIL) 10 MG tablet Take 1 tablet (10 mg total) by mouth 3 (three) times daily as needed.  40 tablet  1  . naproxen (NAPROSYN) 500 MG tablet Take 1 tablet (500 mg total) by mouth 2 (two) times daily with a meal. X 7 days  40  tablet  1  . sertraline (ZOLOFT) 25 MG tablet Take 25 mg by mouth daily.        . cholecalciferol (VITAMIN D) 1000 UNITS tablet Take 1,000 Units by mouth daily.        . fish oil-omega-3 fatty acids 1000 MG capsule Take 2 g by mouth daily.        . sertraline (ZOLOFT) 50 MG tablet TAKE 1/2 TABLET FOR THE FIRST 6 DAYS AND THEN 1 TABLET ONCE A DAY ORALLY  30 tablet  2  . Thiamine HCl (VITAMIN B-1) 100 MG tablet Take 100 mg by mouth every other day.          Allergies  Allergen Reactions  . Prednisone     REACTION: face turns red    Review of Systems  Review of Systems  Constitutional: Negative for fever and malaise/fatigue.  HENT: Positive for nosebleeds and congestion. Negative for ear pain and sore throat.   Eyes: Negative for discharge.  Respiratory: Negative for shortness of breath.   Cardiovascular: Negative for chest pain, palpitations and leg  swelling.  Gastrointestinal: Negative for nausea, abdominal pain and diarrhea.  Genitourinary: Negative for dysuria.  Musculoskeletal: Negative for falls.  Skin: Negative for rash.  Neurological: Negative for loss of consciousness and headaches.  Endo/Heme/Allergies: Negative for polydipsia.  Psychiatric/Behavioral: Negative for depression and suicidal ideas. The patient is not nervous/anxious and does not have insomnia.     Objective  BP 106/80  Pulse 57  Temp(Src) 98.1 F (36.7 C) (Oral)  Ht 5' 6.75" (1.695 m)  Wt 198 lb (89.812 kg)  BMI 31.24 kg/m2  SpO2 97%  Physical Exam  Physical Exam  Constitutional: She is oriented to person, place, and time and well-developed, well-nourished, and in no distress. No distress.  HENT:  Head: Normocephalic and atraumatic.  Mouth/Throat: Oropharynx is clear and moist. No oropharyngeal exudate.       Nasal mucosa boggy and erythematous, no concerning lesions visualized  Eyes: Conjunctivae are normal.  Neck: Neck supple. No thyromegaly present.  Cardiovascular: Normal rate, regular rhythm and normal heart sounds.   No murmur heard. Pulmonary/Chest: Effort normal and breath sounds normal. She has no wheezes.  Abdominal: She exhibits no distension and no mass.  Musculoskeletal: She exhibits no edema.  Lymphadenopathy:    She has no cervical adenopathy.  Neurological: She is alert and oriented to person, place, and time.  Skin: Skin is warm and dry. No rash noted. She is not diaphoretic.  Psychiatric: Memory, affect and judgment normal.    Assessment & Plan  TOBACCO ABUSE Unfortunately continues to smoke daily, counseled for 3 minutes regarding the need to quit to improve her sinus disease, patient expresses understanding but is noncommital  ALLERGIC RHINITIS, SEASONAL Encouraged daily OTC non sedating antihistamine such as Loratadine, rx provided  Sinusitis acute Patient having increased pain and pressure over her left maxillary  sinus for the last several weeks. Initially went to the touch wondering if it was due to pain and she does have a bruit in that region. The dentist felt as if the bridge was okay but that is likely sinus infection due to the pressure and congestion. She does note she is having nasal congestion sneezing tends gargling with itchy watery nose as well. Occasional itchy throat. She does not fevers and chills but does acknowledge some either Will treat acute sinusitis with Levaquin 500 mg daily and ask her to treat her allergies more aggressively and we will reassess if symptoms  do not improve.  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 Encouraged ice and Aspercreme topically prn and notify us if symptoms worsen

## 2010-08-24 NOTE — Assessment & Plan Note (Signed)
Patient having increased pain and pressure over her left maxillary sinus for the last several weeks. Initially went to the touch wondering if it was due to pain and she does have a bruit in that region. The dentist felt as if the bridge was okay but that is likely sinus infection due to the pressure and congestion. She does note she is having nasal congestion sneezing tends gargling with itchy watery nose as well. Occasional itchy throat. She does not fevers and chills but does acknowledge some either Will treat acute sinusitis with Levaquin 500 mg daily and ask her to treat her allergies more aggressively and we will reassess if symptoms do not improve.

## 2010-09-08 ENCOUNTER — Other Ambulatory Visit (INDEPENDENT_AMBULATORY_CARE_PROVIDER_SITE_OTHER): Payer: BC Managed Care – PPO

## 2010-09-08 DIAGNOSIS — E785 Hyperlipidemia, unspecified: Secondary | ICD-10-CM

## 2010-09-08 DIAGNOSIS — R002 Palpitations: Secondary | ICD-10-CM

## 2010-09-08 DIAGNOSIS — E559 Vitamin D deficiency, unspecified: Secondary | ICD-10-CM

## 2010-09-08 DIAGNOSIS — N959 Unspecified menopausal and perimenopausal disorder: Secondary | ICD-10-CM

## 2010-09-08 LAB — RENAL FUNCTION PANEL
BUN: 14 mg/dL (ref 6–23)
Calcium: 8.5 mg/dL (ref 8.4–10.5)
Creatinine, Ser: 0.7 mg/dL (ref 0.4–1.2)
Glucose, Bld: 98 mg/dL (ref 70–99)
Sodium: 138 mEq/L (ref 135–145)

## 2010-09-08 LAB — HEPATIC FUNCTION PANEL
ALT: 12 U/L (ref 0–35)
AST: 15 U/L (ref 0–37)
Albumin: 3.5 g/dL (ref 3.5–5.2)
Total Bilirubin: 0.6 mg/dL (ref 0.3–1.2)

## 2010-09-08 LAB — CBC WITH DIFFERENTIAL/PLATELET
Basophils Absolute: 0 10*3/uL (ref 0.0–0.1)
Eosinophils Absolute: 0.2 10*3/uL (ref 0.0–0.7)
Eosinophils Relative: 3.1 % (ref 0.0–5.0)
MCV: 100.1 fl — ABNORMAL HIGH (ref 78.0–100.0)
Monocytes Absolute: 0.5 10*3/uL (ref 0.1–1.0)
Neutrophils Relative %: 59.8 % (ref 43.0–77.0)
Platelets: 227 10*3/uL (ref 150.0–400.0)
RDW: 13.5 % (ref 11.5–14.6)
WBC: 7.6 10*3/uL (ref 4.5–10.5)

## 2010-09-08 LAB — LIPID PANEL
HDL: 49.3 mg/dL (ref >39.00)
Triglycerides: 57 mg/dL (ref 0.0–149.0)

## 2010-09-08 LAB — TSH: TSH: 1.5 u[IU]/mL (ref 0.35–5.50)

## 2010-09-09 LAB — VITAMIN D 25 HYDROXY (VIT D DEFICIENCY, FRACTURES): Vit D, 25-Hydroxy: 40 ng/mL (ref 30–89)

## 2010-11-07 ENCOUNTER — Other Ambulatory Visit: Payer: Self-pay | Admitting: Family Medicine

## 2010-11-13 ENCOUNTER — Other Ambulatory Visit: Payer: Self-pay

## 2010-11-13 DIAGNOSIS — F419 Anxiety disorder, unspecified: Secondary | ICD-10-CM

## 2010-11-13 NOTE — Telephone Encounter (Signed)
Cannot fill at this time, patient use seems to be increasing, #60 should last her 30 days although she has been given permission to take the med up to 3 x daily in a bad situation but even if she was using it that way it should not be time to fill it for over a week. If she is under increased stress and she feels she needs further treatment she is welcome to come in and discuss her symptoms and current situation

## 2011-02-07 ENCOUNTER — Other Ambulatory Visit: Payer: Self-pay | Admitting: Family Medicine

## 2011-02-26 ENCOUNTER — Other Ambulatory Visit: Payer: Self-pay

## 2011-02-26 DIAGNOSIS — F419 Anxiety disorder, unspecified: Secondary | ICD-10-CM

## 2011-02-26 MED ORDER — CLONAZEPAM 0.5 MG PO TABS: 0.5000 mg | ORAL_TABLET | Freq: Three times a day (TID) | ORAL | Status: DC | PRN

## 2011-05-05 ENCOUNTER — Other Ambulatory Visit: Payer: Self-pay | Admitting: Family Medicine

## 2011-07-01 ENCOUNTER — Other Ambulatory Visit: Payer: Self-pay | Admitting: Cardiology

## 2011-07-02 ENCOUNTER — Other Ambulatory Visit: Payer: Self-pay | Admitting: *Deleted

## 2011-07-02 ENCOUNTER — Telehealth: Payer: Self-pay | Admitting: *Deleted

## 2011-07-02 DIAGNOSIS — F419 Anxiety disorder, unspecified: Secondary | ICD-10-CM

## 2011-07-02 MED ORDER — CLONAZEPAM 0.5 MG PO TABS: 0.5000 mg | ORAL_TABLET | Freq: Three times a day (TID) | ORAL | Status: DC | PRN

## 2011-07-02 NOTE — Telephone Encounter (Signed)
Spoke with pt, she has decreased her dosage due to dizziness with the increased dosage. She is feeling fine now.

## 2011-07-02 NOTE — Telephone Encounter (Signed)
[facility] 

## 2011-07-02 NOTE — Telephone Encounter (Signed)
refill request received for Klonopin Last seen on 08/23/10 Follow up not indicated in note Last filled on 02/26/11, #60 x 1 Please advise if refill OK.

## 2011-07-02 NOTE — Telephone Encounter (Signed)
Called pt to verify what dose of atenolol she was on she states she's suppose to be on 1 tab po bid.Andrea KitchenMarland KitchenBut she states she only does 1 tab po qd. Will route this to Dr. Jens Som nurse jut incase she needs to speak to patient about self dosing

## 2011-07-03 ENCOUNTER — Other Ambulatory Visit: Payer: Self-pay

## 2011-07-03 MED ORDER — SERTRALINE HCL 50 MG PO TABS: 50.0000 mg | ORAL_TABLET | Freq: Every day | ORAL | Status: DC

## 2011-07-03 NOTE — Telephone Encounter (Signed)
[facility] [place] and [employer] resent the [facility]

## 2011-08-20 ENCOUNTER — Encounter: Payer: Self-pay | Admitting: Family Medicine

## 2011-08-20 ENCOUNTER — Ambulatory Visit (INDEPENDENT_AMBULATORY_CARE_PROVIDER_SITE_OTHER): Payer: Managed Care, Other (non HMO) | Admitting: Family Medicine

## 2011-08-20 VITALS — BP 117/76 | HR 66 | Temp 98.3°F | Ht 66.75 in | Wt 211.8 lb

## 2011-08-20 DIAGNOSIS — M25569 Pain in unspecified knee: Secondary | ICD-10-CM

## 2011-08-20 DIAGNOSIS — N951 Menopausal and female climacteric states: Secondary | ICD-10-CM

## 2011-08-20 DIAGNOSIS — M79609 Pain in unspecified limb: Secondary | ICD-10-CM

## 2011-08-20 DIAGNOSIS — M25561 Pain in right knee: Secondary | ICD-10-CM

## 2011-08-20 DIAGNOSIS — F419 Anxiety disorder, unspecified: Secondary | ICD-10-CM

## 2011-08-20 DIAGNOSIS — F329 Major depressive disorder, single episode, unspecified: Secondary | ICD-10-CM

## 2011-08-20 DIAGNOSIS — M79645 Pain in left finger(s): Secondary | ICD-10-CM

## 2011-08-20 DIAGNOSIS — M79646 Pain in unspecified finger(s): Secondary | ICD-10-CM

## 2011-08-20 DIAGNOSIS — M79671 Pain in right foot: Secondary | ICD-10-CM

## 2011-08-20 DIAGNOSIS — M722 Plantar fascial fibromatosis: Secondary | ICD-10-CM

## 2011-08-20 MED ORDER — CLONIDINE HCL 0.1 MG PO TABS: 0.1000 mg | ORAL_TABLET | Freq: Every day | ORAL | Status: DC

## 2011-08-20 MED ORDER — MELOXICAM 15 MG PO TABS: 15.0000 mg | ORAL_TABLET | Freq: Every day | ORAL | Status: DC

## 2011-08-20 MED ORDER — SERTRALINE HCL 50 MG PO TABS: 50.0000 mg | ORAL_TABLET | Freq: Every day | ORAL | Status: DC

## 2011-08-20 MED ORDER — CLONAZEPAM 0.5 MG PO TABS: 0.5000 mg | ORAL_TABLET | Freq: Three times a day (TID) | ORAL | Status: DC | PRN

## 2011-08-20 NOTE — Patient Instructions (Signed)
Knee Pain The knee is the complex joint between your thigh and your lower leg. It is made up of bones, tendons, ligaments, and cartilage. The bones that make up the knee are:  The femur in the thigh.   The tibia and fibula in the lower leg.   The patella or kneecap riding in the groove on the lower femur.  CAUSES  Knee pain is a common complaint with many causes. A few of these causes are:  Injury, such as:   A ruptured ligament or tendon injury.   Torn cartilage.   Medical conditions, such as:   Gout   Arthritis   Infections   Overuse, over training or overdoing a physical activity.  Knee pain can be minor or severe. Knee pain can accompany debilitating injury. Minor knee problems often respond well to self-care measures or get well on their own. More serious injuries may need medical intervention or even surgery. SYMPTOMS The knee is complex. Symptoms of knee problems can vary widely. Some of the problems are:  Pain with movement and weight bearing.   Swelling and tenderness.   Buckling of the knee.   Inability to straighten or extend your knee.   Your knee locks and you cannot straighten it.   Warmth and redness with pain and fever.   Deformity or dislocation of the kneecap.  DIAGNOSIS  Determining what is wrong may be very straight forward such as when there is an injury. It can also be challenging because of the complexity of the knee. Tests to make a diagnosis may include:  Your caregiver taking a history and doing a physical exam.   Routine X-rays can be used to rule out other problems. X-rays will not reveal a cartilage tear. Some injuries of the knee can be diagnosed by:   Arthroscopy a surgical technique by which a small video camera is inserted through tiny incisions on the sides of the knee. This procedure is used to examine and repair internal knee joint problems. Tiny instruments can be used during arthroscopy to repair the torn knee cartilage  (meniscus).   Arthrography is a radiology technique. A contrast liquid is directly injected into the knee joint. Internal structures of the knee joint then become visible on X-ray film.   An MRI scan is a non x-ray radiology procedure in which magnetic fields and a computer produce two- or three-dimensional images of the inside of the knee. Cartilage tears are often visible using an MRI scanner. MRI scans have largely replaced arthrography in diagnosing cartilage tears of the knee.   Blood work.   Examination of the fluid that helps to lubricate the knee joint (synovial fluid). This is done by taking a sample out using a needle and a syringe.  TREATMENT The treatment of knee problems depends on the cause. Some of these treatments are:  Depending on the injury, proper casting, splinting, surgery or physical therapy care will be needed.   Give yourself adequate recovery time. Do not overuse your joints. If you begin to get sore during workout routines, back off. Slow down or do fewer repetitions.   For repetitive activities such as cycling or running, maintain your strength and nutrition.   Alternate muscle groups. For example if you are a weight lifter, work the upper body on one day and the lower body the next.   Either tight or weak muscles do not give the proper support for your knee. Tight or weak muscles do not absorb the stress placed   on the knee joint. Keep the muscles surrounding the knee strong.   Take care of mechanical problems.   If you have flat feet, orthotics or special shoes may help. See your caregiver if you need help.   Arch supports, sometimes with wedges on the inner or outer aspect of the heel, can help. These can shift pressure away from the side of the knee most bothered by osteoarthritis.   A brace called an "unloader" brace also may be used to help ease the pressure on the most arthritic side of the knee.   If your caregiver has prescribed crutches, braces,  wraps or ice, use as directed. The acronym for this is PRICE. This means protection, rest, ice, compression and elevation.   Nonsteroidal anti-inflammatory drugs (NSAID's), can help relieve pain. But if taken immediately after an injury, they may actually increase swelling. Take NSAID's with food in your stomach. Stop them if you develop stomach problems. Do not take these if you have a history of ulcers, stomach pain or bleeding from the bowel. Do not take without your caregiver's approval if you have problems with fluid retention, heart failure, or kidney problems.   For ongoing knee problems, physical therapy may be helpful.   Glucosamine and chondroitin are over-the-counter dietary supplements. Both may help relieve the pain of osteoarthritis in the knee. These medicines are different from the usual anti-inflammatory drugs. Glucosamine may decrease the rate of cartilage destruction.   Injections of a corticosteroid drug into your knee joint may help reduce the symptoms of an arthritis flare-up. They may provide pain relief that lasts a few months. You may have to wait a few months between injections. The injections do have a small increased risk of infection, water retention and elevated blood sugar levels.   Hyaluronic acid injected into damaged joints may ease pain and provide lubrication. These injections may work by reducing inflammation. A series of shots may give relief for as long as 6 months.   Topical painkillers. Applying certain ointments to your skin may help relieve the pain and stiffness of osteoarthritis. Ask your pharmacist for suggestions. Many over the-counter products are approved for temporary relief of arthritis pain.   In some countries, doctors often prescribe topical NSAID's for relief of chronic conditions such as arthritis and tendinitis. A review of treatment with NSAID creams found that they worked as well as oral medications but without the serious side effects.    PREVENTION  Maintain a healthy weight. Extra pounds put more strain on your joints.   Get strong, stay limber. Weak muscles are a common cause of knee injuries. Stretching is important. Include flexibility exercises in your workouts.   Be smart about exercise. If you have osteoarthritis, chronic knee pain or recurring injuries, you may need to change the way you exercise. This does not mean you have to stop being active. If your knees ache after jogging or playing basketball, consider switching to swimming, water aerobics or other low-impact activities, at least for a few days a week. Sometimes limiting high-impact activities will provide relief.   Make sure your shoes fit well. Choose footwear that is right for your sport.   Protect your knees. Use the proper gear for knee-sensitive activities. Use kneepads when playing volleyball or laying carpet. Buckle your seat belt every time you drive. Most shattered kneecaps occur in car accidents.   Rest when you are tired.  SEEK MEDICAL CARE IF:  You have knee pain that is continual and does not   seem to be getting better.  SEEK IMMEDIATE MEDICAL CARE IF:  Your knee joint feels hot to the touch and you have a high fever. MAKE SURE YOU:   Understand these instructions.   Will watch your condition.   Will get help right away if you are not doing well or get worse.  Document Released: 01/21/2007 Document Revised: 03/15/2011 Document Reviewed: 01/21/2007 [facility] Patient Information 2012 [employer], [place].   Start a Actuary daily

## 2011-08-26 ENCOUNTER — Encounter: Payer: Self-pay | Admitting: Family Medicine

## 2011-08-26 DIAGNOSIS — M79671 Pain in right foot: Secondary | ICD-10-CM | POA: Insufficient documentation

## 2011-08-26 DIAGNOSIS — M79646 Pain in unspecified finger(s): Secondary | ICD-10-CM | POA: Insufficient documentation

## 2011-08-26 NOTE — Assessment & Plan Note (Signed)
Pain mostly noted over heels. Encouraged daily stretching, Meloxicam, quality foot wear and inserts for shoes. Consider podiatry if no improvement

## 2011-08-26 NOTE — Progress Notes (Signed)
Patient ID: Andrea Bowman, female   DOB: [birth year], 51 y.o.   MRN: [id number] Andrea Bowman [id number] 04-21-1960 08/26/2011      Progress Note-Follow Up  Subjective  Chief Complaint  Chief Complaint  Patient presents with  . Hand Problem    thumb weakness X over a year  . Knee Problem    X 1.5 months, bends backwards and feels alot of pressure  . heels pain    X 3 months- heels hurt on both feet    HPI  Patient is a 51 year old Caucasian female in today for the first of multiple complaints. Complaining knee pain worsening over the months. Pain is diffuse in the week course of action. No falls or injury. The pain is constant. She has pain in both feet most noted on the heels grandmother 3 months. Also complains of pain in both thumbs. Worse at the end of the day and ice if helpful until has been used several times a day with minimal relief. No warmth or redness is noted. No other acute illness or fevers noted.  Past Medical History  Diagnosis Date  . PVC (premature ventricular contraction)   . Depression   . Anxiety   . UTI'S, HX OF 05/12/2010  . Unspecified vitamin D deficiency 05/12/2010  . TOBACCO ABUSE 11/30/2009  . PREMATURE VENTRICULAR CONTRACTIONS 03/15/2010  . PERIMENOPAUSAL SYNDROME 05/12/2010  . Palpitations 11/28/2009  . MUMPS, HX OF 05/12/2010  . HEMATURIA, HX OF 05/12/2010  . CHICKENPOX, HX OF 05/12/2010  . ANXIETY DEPRESSION 11/29/2009  . ALLERGIC RHINITIS, SEASONAL 05/12/2010  . Sinusitis acute 08/24/2010  . 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 Knee pain, right 08/20/2011  . Foot pain, bilateral 08/26/2011  . Thumb pain 08/26/2011    Past Surgical History  Procedure Date  . Cholecystectomy   . Abdominal surgery 51 yrs old  . Verrucous lesion      right scalp excise, benign    Family History  Problem Relation Age of Onset  . Cancer Mother     Breast  . Hypertension Father   . Other Father     CHF  . Depression Sister    previous history of  . Anxiety disorder Sister     Previous history of  . Depression Brother   . Anxiety disorder Brother   . Ulcers Brother     PUD  . Cancer Maternal Aunt     X several w/ breast cancer, various ages  . Cancer Paternal Uncle     X several w/ prostate cancer  . Cancer Maternal Grandmother   . COPD Neg Hx     History   Social History  . Marital Status: Married    Spouse Name: N/A    Number of Children: N/A  . Years of Education: N/A   Occupational History  . Not on file.   Social History Main Topics  . Smoking status: Current Everyday Smoker -- 0.5 packs/day  . Smokeless tobacco: Never Used  . Alcohol Use: No  . Drug Use: No  . Sexually Active: Yes -- Female partner(s)   Other Topics Concern  . Not on file   Social History Narrative  . No narrative on file    Current Outpatient Prescriptions on File Prior to Visit  Medication Sig Dispense Refill  . atenolol (TENORMIN) 25 MG tablet Take 25 mg by mouth 2 (two) times daily.        . clonazePAM (KLONOPIN) 0.5 MG  tablet Take 1 tablet (0.5 mg total) by mouth 3 (three) times daily as needed for anxiety.  60 tablet  2  . cloNIDine (CATAPRES) 0.1 MG tablet Take 1 tablet (0.1 mg total) by mouth at bedtime.  90 tablet  3  . loratadine (CLARITIN) 10 MG tablet Take 1 tablet (10 mg total) by mouth daily.  30 tablet  5  . Probiotic Product (ALIGN) 4 MG CAPS Take 1 capsule by mouth daily.  30 capsule  0  . sertraline (ZOLOFT) 50 MG tablet Take 1 tablet (50 mg total) by mouth daily.  30 tablet  2    Allergies  Allergen Reactions  . Prednisone     REACTION: face turns red    Review of Systems  Review of Systems  Constitutional: Negative for fever and malaise/fatigue.  HENT: Negative for congestion.   Eyes: Negative for discharge.  Respiratory: Negative for shortness of breath.   Cardiovascular: Negative for chest pain, palpitations and leg swelling.  Gastrointestinal: Negative for nausea, abdominal pain and  diarrhea.  Genitourinary: Negative for dysuria.  Musculoskeletal: Positive for joint pain. Negative for falls.  Skin: Negative for rash.  Neurological: Negative for loss of consciousness and headaches.  Endo/Heme/Allergies: Negative for polydipsia.  Psychiatric/Behavioral: Negative for depression and suicidal ideas. The patient is not nervous/anxious and does not have insomnia.     Objective  BP 117/76  Pulse 66  Temp(Src) 98.3 F (36.8 C) (Temporal)  Ht 5' 6.75" (1.695 m)  Wt 211 lb 12.8 oz (96.072 kg)  BMI 33.42 kg/m2  SpO2 97%  Physical Exam  Physical Exam  Constitutional: She is oriented to person, place, and time and well-developed, well-nourished, and in no distress. No distress.  HENT:  Head: Normocephalic and atraumatic.  Eyes: Conjunctivae are normal.  Neck: Neck supple. No thyromegaly present.  Cardiovascular: Normal rate, regular rhythm and normal heart sounds.   No murmur heard. Pulmonary/Chest: Effort normal and breath sounds normal. She has no wheezes.  Abdominal: She exhibits no distension and no mass.  Musculoskeletal: She exhibits tenderness. She exhibits no edema.       Pain with palp over b/l thenar prominences. Pain with palp over right medial meniscus. Pain with palp over b/l heels  Lymphadenopathy:    She has no cervical adenopathy.  Neurological: She is alert and oriented to person, place, and time.  Skin: Skin is warm and dry. No rash noted. She is not diaphoretic.  Psychiatric: Memory, affect and judgment normal.    Lab Results  Component Value Date   TSH 1.50 09/08/2010   Lab Results  Component Value Date   WBC 7.6 09/08/2010   HGB 14.8 09/08/2010   HCT 43.2 09/08/2010   MCV 100.1* 09/08/2010   PLT 227.0 09/08/2010   Lab Results  Component Value Date   CREATININE 0.7 09/08/2010   BUN 14 09/08/2010   NA 138 09/08/2010   K 4.6 09/08/2010   CL 106 09/08/2010   CO2 26 09/08/2010   Lab Results  Component Value Date   ALT 12 09/08/2010   AST 15 09/08/2010    ALKPHOS 40 09/08/2010   BILITOT 0.6 09/08/2010   Lab Results  Component Value Date   CHOL 151 09/08/2010   Lab Results  Component Value Date   HDL 49.30 09/08/2010   Lab Results  Component Value Date   LDLCALC 90 09/08/2010   Lab Results  Component Value Date   TRIG 57.0 09/08/2010   Lab Results  Component Value Date  CHOLHDL 3 09/08/2010     Assessment & Plan  Knee pain, right Has had trouble off and on in past but it has been consistent for several months. Encouraged Fatty acid supplements daily, antiinflammatories daily and referred to orthopaedics for further evaluation at this time  Foot pain, bilateral Pain mostly noted over heels. Encouraged daily stretching, Meloxicam, quality foot wear and inserts for shoes. Consider podiatry if no improvement  Thumb pain Ice and immobilization. Meloxicam daily and referred to ortho for further evaluation and treatment

## 2011-08-26 NOTE — Assessment & Plan Note (Signed)
Has had trouble off and on in past but it has been consistent for several months. Encouraged Fatty acid supplements daily, antiinflammatories daily and referred to orthopaedics for further evaluation at this time

## 2011-08-26 NOTE — Assessment & Plan Note (Signed)
Ice and immobilization. Meloxicam daily and referred to ortho for further evaluation and treatment

## 2011-09-17 ENCOUNTER — Ambulatory Visit (INDEPENDENT_AMBULATORY_CARE_PROVIDER_SITE_OTHER): Payer: Managed Care, Other (non HMO) | Admitting: Family Medicine

## 2011-09-17 ENCOUNTER — Encounter: Payer: Self-pay | Admitting: Family Medicine

## 2011-09-17 VITALS — BP 108/72 | HR 62 | Temp 98.7°F | Ht 66.75 in | Wt 209.8 lb

## 2011-09-17 DIAGNOSIS — J019 Acute sinusitis, unspecified: Secondary | ICD-10-CM

## 2011-09-17 DIAGNOSIS — M79672 Pain in left foot: Secondary | ICD-10-CM

## 2011-09-17 DIAGNOSIS — F341 Dysthymic disorder: Secondary | ICD-10-CM

## 2011-09-17 DIAGNOSIS — J301 Allergic rhinitis due to pollen: Secondary | ICD-10-CM

## 2011-09-17 DIAGNOSIS — M79671 Pain in right foot: Secondary | ICD-10-CM

## 2011-09-17 DIAGNOSIS — Z0000 Encounter for general adult medical examination without abnormal findings: Secondary | ICD-10-CM

## 2011-09-17 DIAGNOSIS — M79609 Pain in unspecified limb: Secondary | ICD-10-CM

## 2011-09-17 DIAGNOSIS — M79646 Pain in unspecified finger(s): Secondary | ICD-10-CM

## 2011-09-17 DIAGNOSIS — M25561 Pain in right knee: Secondary | ICD-10-CM

## 2011-09-17 DIAGNOSIS — M25569 Pain in unspecified knee: Secondary | ICD-10-CM

## 2011-09-17 DIAGNOSIS — N959 Unspecified menopausal and perimenopausal disorder: Secondary | ICD-10-CM

## 2011-09-17 LAB — LIPID PANEL
HDL: 49.7 mg/dL (ref >39.00)
LDL Cholesterol: 108 mg/dL — ABNORMAL HIGH (ref 0–99)
VLDL: 18.6 mg/dL (ref 0.0–40.0)

## 2011-09-17 LAB — RENAL FUNCTION PANEL
BUN: 14 mg/dL (ref 6–23)
CO2: 25 mEq/L (ref 19–32)
Chloride: 107 mEq/L (ref 96–112)
Creatinine, Ser: 0.8 mg/dL (ref 0.4–1.2)
GFR: 83.35 mL/min (ref >60.00)
Phosphorus: 2.9 mg/dL (ref 2.3–4.6)
Sodium: 140 mEq/L (ref 135–145)

## 2011-09-17 LAB — CBC
HCT: 42.6 % (ref 36.0–46.0)
Hemoglobin: 14.1 g/dL (ref 12.0–15.0)
MCHC: 33.1 g/dL (ref 30.0–36.0)
RDW: 13.6 % (ref 11.5–14.6)
WBC: 7.3 10*3/uL (ref 4.5–10.5)

## 2011-09-17 LAB — TSH: TSH: 1.01 u[IU]/mL (ref 0.35–5.50)

## 2011-09-17 LAB — HEPATIC FUNCTION PANEL
AST: 28 U/L (ref 0–37)
Alkaline Phosphatase: 48 U/L (ref 39–117)
Bilirubin, Direct: 0 mg/dL (ref 0.0–0.3)
Total Bilirubin: 0.3 mg/dL (ref 0.3–1.2)

## 2011-09-17 NOTE — Assessment & Plan Note (Signed)
Has been on Meloxicam a month now and notes mild improvement in swelling but discomfort persists, has f/u with ortho later this month and will likely have an MRI

## 2011-09-17 NOTE — Progress Notes (Signed)
Patient ID: Andrea Bowman, female   DOB: 07/15/1960, 51 y.o.   MRN: [id number] Andrea Bowman [id number] 1960-04-10 09/17/2011      Progress Note-Follow Up  Subjective  Chief Complaint  Chief Complaint  Patient presents with  . Follow-up    1 month     HPI  Patient is a 51 year old Caucasian female who is in today for followup. She has been seen by orthopedics for her right knee. They did an x-ray and will see her later in the month for possible MRI due to persistent pain. It kept her meloxicam and while she thinks the swelling is improved the itching and discomfort are still present. She's had a good response to sertraline increased. Flashes are significantly less intense and less frequent. She did not feel the need to start clonidine. She has had an EMG studies on her left hand secondary to her left thumb pain., Was ruled out but her discomfort persists. She has been seen by podiatry for heel pain. She does note distressing and falls he is given her she is improving although she does still have some discomfort. No acute illness, chest pain, palpitations, GI or GU complaints noted today. No chest pain, palpitations, shortness of breath.  Past Medical History  Diagnosis Date  . PVC (premature ventricular contraction)   . Depression   . Anxiety   . UTI'S, HX OF 05/12/2010  . Unspecified vitamin D deficiency 05/12/2010  . TOBACCO ABUSE 11/30/2009  . PREMATURE VENTRICULAR CONTRACTIONS 03/15/2010  . PERIMENOPAUSAL SYNDROME 05/12/2010  . Palpitations 11/28/2009  . MUMPS, HX OF 05/12/2010  . HEMATURIA, HX OF 05/12/2010  . CHICKENPOX, HX OF 05/12/2010  . ANXIETY DEPRESSION 11/29/2009  . ALLERGIC RHINITIS, SEASONAL 05/12/2010  . Sinusitis acute 08/24/2010  . 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 Knee pain, right 08/20/2011  . Foot pain, bilateral 08/26/2011  . Thumb pain 08/26/2011    Past Surgical History  Procedure Date  . Cholecystectomy   . Abdominal surgery  51 yrs old  . Verrucous lesion      right scalp excise, benign    Family History  Problem Relation Age of Onset  . Cancer Mother     Breast  . Hypertension Father   . Other Father     CHF  . Depression Sister     previous history of  . Anxiety disorder Sister     Previous history of  . Depression Brother   . Anxiety disorder Brother   . Ulcers Brother     PUD  . Cancer Maternal Aunt     X several w/ breast cancer, various ages  . Cancer Paternal Uncle     X several w/ prostate cancer  . Cancer Maternal Grandmother   . COPD Neg Hx     History   Social History  . Marital Status: Married    Spouse Name: N/A    Number of Children: N/A  . Years of Education: N/A   Occupational History  . Not on file.   Social History Main Topics  . Smoking status: Current Everyday Smoker -- 0.5 packs/day  . Smokeless tobacco: Never Used  . Alcohol Use: No  . Drug Use: No  . Sexually Active: Yes -- Female partner(s)   Other Topics Concern  . Not on file   Social History Narrative  . No narrative on file    Current Outpatient Prescriptions on File Prior to Visit  Medication Sig Dispense Refill  .  atenolol (TENORMIN) 25 MG tablet Take 25 mg by mouth 2 (two) times daily.        . clonazePAM (KLONOPIN) 0.5 MG tablet Take 1 tablet (0.5 mg total) by mouth 3 (three) times daily as needed for anxiety.  60 tablet  2  . meloxicam (MOBIC) 15 MG tablet Take 1 tablet (15 mg total) by mouth daily.  30 tablet  3  . sertraline (ZOLOFT) 50 MG tablet Take 1 tablet (50 mg total) by mouth daily.  30 tablet  2  . cloNIDine (CATAPRES) 0.1 MG tablet Take 1 tablet (0.1 mg total) by mouth at bedtime.  90 tablet  3    Allergies  Allergen Reactions  . Prednisone     REACTION: face turns red    Review of Systems  Review of Systems  Constitutional: Negative for fever and malaise/fatigue.  HENT: Negative for congestion.   Eyes: Negative for discharge.  Respiratory: Negative for shortness of breath.    Cardiovascular: Negative for chest pain, palpitations and leg swelling.  Gastrointestinal: Negative for nausea, abdominal pain and diarrhea.  Genitourinary: Negative for dysuria.  Musculoskeletal: Positive for joint pain. Negative for falls.       Right knee, left thumb, b/l heel pain all improving to different degrees  Skin: Negative for rash.  Neurological: Negative for loss of consciousness and headaches.  Endo/Heme/Allergies: Negative for polydipsia.  Psychiatric/Behavioral: Negative for depression and suicidal ideas. The patient is not nervous/anxious and does not have insomnia.     Objective  BP 108/72  Pulse 62  Temp(Src) 98.7 F (37.1 C) (Temporal)  Ht 5' 6.75" (1.695 m)  Wt 209 lb 12.8 oz (95.165 kg)  BMI 33.11 kg/m2  SpO2 96%  Physical Exam  Physical Exam  Constitutional: She is oriented to person, place, and time and well-developed, well-nourished, and in no distress. No distress.  HENT:  Head: Normocephalic and atraumatic.  Eyes: Conjunctivae are normal.  Neck: Neck supple. No thyromegaly present.  Cardiovascular: Normal rate, regular rhythm and normal heart sounds.   No murmur heard. Pulmonary/Chest: Effort normal and breath sounds normal. She has no wheezes.  Abdominal: She exhibits no distension and no mass.  Musculoskeletal: She exhibits no edema.  Lymphadenopathy:    She has no cervical adenopathy.  Neurological: She is alert and oriented to person, place, and time.  Skin: Skin is warm and dry. No rash noted. She is not diaphoretic.  Psychiatric: Memory, affect and judgment normal.    Lab Results  Component Value Date   TSH 1.50 09/08/2010   Lab Results  Component Value Date   WBC 7.6 09/08/2010   HGB 14.8 09/08/2010   HCT 43.2 09/08/2010   MCV 100.1* 09/08/2010   PLT 227.0 09/08/2010   Lab Results  Component Value Date   CREATININE 0.7 09/08/2010   BUN 14 09/08/2010   NA 138 09/08/2010   K 4.6 09/08/2010   CL 106 09/08/2010   CO2 26 09/08/2010   Lab Results   Component Value Date   ALT 12 09/08/2010   AST 15 09/08/2010   ALKPHOS 40 09/08/2010   BILITOT 0.6 09/08/2010   Lab Results  Component Value Date   CHOL 151 09/08/2010   Lab Results  Component Value Date   HDL 49.30 09/08/2010   Lab Results  Component Value Date   LDLCALC 90 09/08/2010   Lab Results  Component Value Date   TRIG 57.0 09/08/2010   Lab Results  Component Value Date   CHOLHDL 3  09/08/2010     Assessment & Plan  PERIMENOPAUSAL SYNDROME Feels much better with increased Sertraline to 50 mg, did not feel the need for the Clonidine. Is still having hot flashes but much more manageable.   ALLERGIC RHINITIS, SEASONAL Improved, no longer taking Claritin daily, will continue prn use  Knee pain, right Has been on Meloxicam a month now and notes mild improvement in swelling but discomfort persists, has f/u with ortho later this month and will likely have an MRI  Sinusitis acute No episodes this use with regular Claritin use during her allergy season  ANXIETY DEPRESSION Doing well on increased Sertraline  Thumb pain Has had EMGs at ortho, ruled out CTS  Foot pain, bilateral Does much better with stretching but continues to have some discomfort is encouraged to change all foot wear.

## 2011-09-17 NOTE — Assessment & Plan Note (Signed)
Does much better with stretching but continues to have some discomfort is encouraged to change all foot wear.

## 2011-09-17 NOTE — Assessment & Plan Note (Signed)
Doing well on increased Sertraline

## 2011-09-17 NOTE — Assessment & Plan Note (Signed)
Feels much better with increased Sertraline to 50 mg, did not feel the need for the Clonidine. Is still having hot flashes but much more manageable.

## 2011-09-17 NOTE — Patient Instructions (Signed)

## 2011-09-17 NOTE — Assessment & Plan Note (Signed)
Has had EMGs at ortho, ruled out CTS

## 2011-09-17 NOTE — Assessment & Plan Note (Signed)
No episodes this use with regular Claritin use during her allergy season

## 2011-09-17 NOTE — Assessment & Plan Note (Signed)
Improved, no longer taking Claritin daily, will continue prn use

## 2011-09-25 ENCOUNTER — Other Ambulatory Visit: Payer: Self-pay | Admitting: Orthopedic Surgery

## 2011-09-25 DIAGNOSIS — M19049 Primary osteoarthritis, unspecified hand: Secondary | ICD-10-CM

## 2011-10-01 ENCOUNTER — Ambulatory Visit
Admission: RE | Admit: 2011-10-01 | Discharge: 2011-10-01 | Disposition: A | Discharge status: Home / Self Care | Payer: Managed Care, Other (non HMO) | Source: Ambulatory Visit | Attending: Orthopedic Surgery | Admitting: Orthopedic Surgery

## 2011-10-01 DIAGNOSIS — M19049 Primary osteoarthritis, unspecified hand: Secondary | ICD-10-CM

## 2011-10-03 ENCOUNTER — Other Ambulatory Visit: Payer: Managed Care, Other (non HMO)

## 2011-11-23 ENCOUNTER — Telehealth: Payer: Self-pay

## 2011-11-23 NOTE — Telephone Encounter (Signed)
We received paperwork from [facility] that patient had been prescribed Meloxicam (from Dr Abner Greenspan) and Diclofenac (from Dr Simonne Come). Per MD these medications are to similar and shouldn't be taking both.  Pt states she is only taking the Diclofenac because the Meloxicam didn't help. I will update med list.

## 2011-12-14 ENCOUNTER — Telehealth: Payer: Self-pay

## 2011-12-14 NOTE — Telephone Encounter (Signed)
Patient called back stating she is no longer taking the Meloxicam

## 2011-12-14 NOTE — Telephone Encounter (Signed)
I left a message for patient to return my call.  We received paperwork from [facility] stating that pt would like a Meloxicam refill? It looks like in the computer that pt is no longer taking this? Waiting for pt call back.

## 2011-12-19 ENCOUNTER — Encounter: Payer: Managed Care, Other (non HMO) | Admitting: Family Medicine

## 2011-12-24 ENCOUNTER — Other Ambulatory Visit: Payer: Self-pay

## 2011-12-24 DIAGNOSIS — F419 Anxiety disorder, unspecified: Secondary | ICD-10-CM

## 2011-12-24 DIAGNOSIS — F329 Major depressive disorder, single episode, unspecified: Secondary | ICD-10-CM

## 2011-12-24 MED ORDER — SERTRALINE HCL 50 MG PO TABS: 50.0000 mg | ORAL_TABLET | Freq: Every day | ORAL | Status: DC

## 2012-01-29 ENCOUNTER — Ambulatory Visit (INDEPENDENT_AMBULATORY_CARE_PROVIDER_SITE_OTHER): Payer: Managed Care, Other (non HMO)

## 2012-01-29 ENCOUNTER — Other Ambulatory Visit: Payer: Self-pay | Admitting: Family Medicine

## 2012-01-29 DIAGNOSIS — Z1231 Encounter for screening mammogram for malignant neoplasm of breast: Secondary | ICD-10-CM

## 2012-01-30 ENCOUNTER — Other Ambulatory Visit (INDEPENDENT_AMBULATORY_CARE_PROVIDER_SITE_OTHER): Payer: Managed Care, Other (non HMO)

## 2012-01-30 DIAGNOSIS — Z0000 Encounter for general adult medical examination without abnormal findings: Secondary | ICD-10-CM

## 2012-01-30 LAB — RENAL FUNCTION PANEL
Albumin: 3.7 g/dL (ref 3.5–5.2)
CO2: 28 mEq/L (ref 19–32)
Chloride: 104 mEq/L (ref 96–112)
Phosphorus: 3.2 mg/dL (ref 2.3–4.6)
Potassium: 4.3 mEq/L (ref 3.5–5.1)

## 2012-01-30 LAB — CBC
HCT: 44.4 % (ref 36.0–46.0)
Hemoglobin: 14.6 g/dL (ref 12.0–15.0)
MCHC: 32.8 g/dL (ref 30.0–36.0)
RDW: 13.3 % (ref 11.5–14.6)

## 2012-01-30 LAB — HEPATIC FUNCTION PANEL
ALT: 21 U/L (ref 0–35)
Total Bilirubin: 0.3 mg/dL (ref 0.3–1.2)
Total Protein: 7.3 g/dL (ref 6.0–8.3)

## 2012-01-30 LAB — LIPID PANEL
HDL: 45.1 mg/dL (ref >39.00)
Triglycerides: 118 mg/dL (ref 0.0–149.0)
VLDL: 23.6 mg/dL (ref 0.0–40.0)

## 2012-01-30 LAB — LDL CHOLESTEROL, DIRECT: Direct LDL: 144.2 mg/dL

## 2012-01-31 LAB — TSH: TSH: 1.49 u[IU]/mL (ref 0.35–5.50)

## 2012-02-06 ENCOUNTER — Encounter: Payer: Self-pay | Admitting: Family Medicine

## 2012-02-06 ENCOUNTER — Ambulatory Visit (INDEPENDENT_AMBULATORY_CARE_PROVIDER_SITE_OTHER): Payer: Managed Care, Other (non HMO) | Admitting: Family Medicine

## 2012-02-06 VITALS — BP 113/72 | HR 56 | Temp 98.6°F | Ht 66.75 in | Wt 210.8 lb

## 2012-02-06 DIAGNOSIS — E785 Hyperlipidemia, unspecified: Secondary | ICD-10-CM

## 2012-02-06 DIAGNOSIS — F419 Anxiety disorder, unspecified: Secondary | ICD-10-CM

## 2012-02-06 DIAGNOSIS — B009 Herpesviral infection, unspecified: Secondary | ICD-10-CM

## 2012-02-06 DIAGNOSIS — F329 Major depressive disorder, single episode, unspecified: Secondary | ICD-10-CM

## 2012-02-06 DIAGNOSIS — B001 Herpesviral vesicular dermatitis: Secondary | ICD-10-CM

## 2012-02-06 DIAGNOSIS — F17200 Nicotine dependence, unspecified, uncomplicated: Secondary | ICD-10-CM

## 2012-02-06 DIAGNOSIS — F411 Generalized anxiety disorder: Secondary | ICD-10-CM

## 2012-02-06 DIAGNOSIS — E663 Overweight: Secondary | ICD-10-CM

## 2012-02-06 DIAGNOSIS — M1710 Unilateral primary osteoarthritis, unspecified knee: Secondary | ICD-10-CM

## 2012-02-06 DIAGNOSIS — F32A Depression, unspecified: Secondary | ICD-10-CM

## 2012-02-06 DIAGNOSIS — M25569 Pain in unspecified knee: Secondary | ICD-10-CM

## 2012-02-06 DIAGNOSIS — E669 Obesity, unspecified: Secondary | ICD-10-CM | POA: Insufficient documentation

## 2012-02-06 DIAGNOSIS — Z1211 Encounter for screening for malignant neoplasm of colon: Secondary | ICD-10-CM

## 2012-02-06 DIAGNOSIS — Z1239 Encounter for other screening for malignant neoplasm of breast: Secondary | ICD-10-CM | POA: Insufficient documentation

## 2012-02-06 DIAGNOSIS — M179 Osteoarthritis of knee, unspecified: Secondary | ICD-10-CM

## 2012-02-06 DIAGNOSIS — M25561 Pain in right knee: Secondary | ICD-10-CM

## 2012-02-06 DIAGNOSIS — Z0000 Encounter for general adult medical examination without abnormal findings: Secondary | ICD-10-CM

## 2012-02-06 DIAGNOSIS — F341 Dysthymic disorder: Secondary | ICD-10-CM

## 2012-02-06 DIAGNOSIS — I4949 Other premature depolarization: Secondary | ICD-10-CM

## 2012-02-06 MED ORDER — ACYCLOVIR 5 % EX CREA: 1.0000 "application " | TOPICAL_CREAM | CUTANEOUS | Status: DC

## 2012-02-06 MED ORDER — CLONAZEPAM 0.5 MG PO TABS: 0.5000 mg | ORAL_TABLET | Freq: Three times a day (TID) | ORAL | Status: DC | PRN

## 2012-02-06 MED ORDER — PHENTERMINE HCL 15 MG PO CAPS: 15.0000 mg | ORAL_CAPSULE | ORAL | Status: DC

## 2012-02-06 MED ORDER — SERTRALINE HCL 50 MG PO TABS: 50.0000 mg | ORAL_TABLET | Freq: Every day | ORAL | Status: DC

## 2012-02-06 MED ORDER — DICLOFENAC SODIUM 3 % TD GEL: TRANSDERMAL | Status: DC

## 2012-02-06 NOTE — Assessment & Plan Note (Signed)
agreed to colonoscopy after long discussion. Referred, fasting labs reviewed, discussed need for heart healthy diet, MGM reviewed, Pap normal last year

## 2012-02-06 NOTE — Assessment & Plan Note (Signed)
Is still smoking about 1/2 ppd discussed at length strategies for complete cessation. Try Ecig, waiting 15 minutes etc.

## 2012-02-06 NOTE — Assessment & Plan Note (Signed)
Zovirax cream 5% q 3 hours prn is prescribed

## 2012-02-06 NOTE — Assessment & Plan Note (Addendum)
She is scheduled to go for a second cortisone injection and we will rx Trandermal [facility] Advanced OA for pain relief and she is taking Glucosamine Chondroitin

## 2012-02-06 NOTE — Assessment & Plan Note (Signed)
Well controlled on low dose Atenolol

## 2012-02-06 NOTE — Progress Notes (Signed)
Patient ID: Andrea Bowman, female   DOB: 1960-06-03, 51 y.o.   MRN: [id number] Andrea Bowman [id number] 09-03-1960 02/06/2012      Progress Note-Follow Up  Subjective  Chief Complaint  Chief Complaint  Patient presents with  . Annual Exam    physical- no pap    HPI  She is a 51 year old Caucasian female who is in today for annual exam and followup. She is having persistent and extreme difficulty with her right knee. Her pain is to the point now where she is considering the Replacement. The pain keeps her from ambulating or doing any significant exercise. She's now seen 2 orthopedists in one says she should try conservative management with cortisone shots and physical therapy in the month that she should have a replacement. For now she will work on physical therapy and agrees to a second cortisone shot. She has started glucosamine. So far no improvement. Unfortunately she continues to smoke about half pack per day. Meloxicam was not helpful" and it caused significant stomach upset so she stopped it. He is still under a great deal of stress but does acknowledge the sertraline is helping. She feels the need for clonazepam less often. Denies chest pain, palpitations, shotness of breath, GI or GU complaints at this time. No suicidal ideation. She is frustrated regarding her weight gain  Past Medical History  Diagnosis Date  . PVC (premature ventricular contraction)   . Depression   . Anxiety   . UTI'S, HX OF 05/12/2010  . Unspecified vitamin D deficiency 05/12/2010  . TOBACCO ABUSE 11/30/2009  . PREMATURE VENTRICULAR CONTRACTIONS 03/15/2010  . PERIMENOPAUSAL SYNDROME 05/12/2010  . Palpitations 11/28/2009  . MUMPS, HX OF 05/12/2010  . HEMATURIA, HX OF 05/12/2010  . CHICKENPOX, HX OF 05/12/2010  . ANXIETY DEPRESSION 11/29/2009  . ALLERGIC RHINITIS, SEASONAL 05/12/2010  . Sinusitis acute 08/24/2010  . 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 Knee pain, right 08/20/2011   . Foot pain, bilateral 08/26/2011  . Thumb pain 08/26/2011  . Herpes labialis 02/06/2012  . Hyperlipidemia 02/06/2012  . Obese 02/06/2012    Past Surgical History  Procedure Date  . Cholecystectomy   . Abdominal surgery 51 yrs old  . Verrucous lesion      right scalp excise, benign    Family History  Problem Relation Age of Onset  . Cancer Mother     Breast  . Hypertension Father   . Other Father     CHF  . Depression Sister     previous history of  . Anxiety disorder Sister     Previous history of  . Depression Brother   . Anxiety disorder Brother   . Ulcers Brother     PUD  . Cancer Maternal Aunt     X several w/ breast cancer, various ages  . Cancer Paternal Uncle     X several w/ prostate cancer  . Cancer Maternal Grandmother   . COPD Neg Hx     History   Social History  . Marital Status: Married    Spouse Name: N/A    Number of Children: N/A  . Years of Education: N/A   Occupational History  . Not on file.   Social History Main Topics  . Smoking status: Current Every Day Smoker -- 0.5 packs/day  . Smokeless tobacco: Never Used  . Alcohol Use: No  . Drug Use: No  . Sexually Active: Yes -- Female partner(s)   Other Topics Concern  .  Not on file   Social History Narrative  . No narrative on file    Current Outpatient Prescriptions on File Prior to Visit  Medication Sig Dispense Refill  . atenolol (TENORMIN) 25 MG tablet Take 25 mg by mouth 2 (two) times daily.        Marland Kitchen DISCONTD: clonazePAM (KLONOPIN) 0.5 MG tablet Take 1 tablet (0.5 mg total) by mouth 3 (three) times daily as needed for anxiety.  60 tablet  2  . DISCONTD: sertraline (ZOLOFT) 50 MG tablet Take 1 tablet (50 mg total) by mouth daily.  30 tablet  5  . phentermine 15 MG capsule Take 1 capsule (15 mg total) by mouth every morning.  30 capsule  0    Allergies  Allergen Reactions  . Prednisone     REACTION: face turns red    Review of Systems  Review of Systems  Constitutional:  Positive for malaise/fatigue. Negative for fever and chills.  HENT: Negative for hearing loss, nosebleeds and congestion.   Eyes: Negative for discharge.  Respiratory: Negative for cough, sputum production, shortness of breath and wheezing.   Cardiovascular: Negative for chest pain, palpitations and leg swelling.  Gastrointestinal: Negative for heartburn, nausea, vomiting, abdominal pain, diarrhea, constipation and blood in stool.  Genitourinary: Negative for dysuria, urgency, frequency and hematuria.  Musculoskeletal: Positive for joint pain. Negative for myalgias, back pain and falls.       Right knee  Skin: Positive for rash.  Neurological: Negative for dizziness, tremors, sensory change, focal weakness, loss of consciousness, weakness and headaches.  Endo/Heme/Allergies: Negative for polydipsia. Does not bruise/bleed easily.  Psychiatric/Behavioral: Positive for depression. Negative for suicidal ideas. The patient is nervous/anxious. The patient does not have insomnia.     Objective  BP 113/72  Pulse 56  Temp 98.6 F (37 C) (Temporal)  Ht 5' 6.75" (1.695 m)  Wt 210 lb 12.8 oz (95.618 kg)  BMI 33.26 kg/m2  SpO2 98%  Physical Exam  Physical Exam  Constitutional: She is oriented to person, place, and time and well-developed, well-nourished, and in no distress. No distress.  HENT:  Head: Normocephalic and atraumatic.  Right Ear: External ear normal.  Left Ear: External ear normal.  Nose: Nose normal.  Mouth/Throat: Oropharynx is clear and moist. No oropharyngeal exudate.  Eyes: Conjunctivae normal are normal. Pupils are equal, round, and reactive to light. Right eye exhibits no discharge. Left eye exhibits no discharge. No scleral icterus.  Neck: Normal range of motion. Neck supple. No thyromegaly present.  Cardiovascular: Normal rate, regular rhythm, normal heart sounds and intact distal pulses.   No murmur heard. Pulmonary/Chest: Effort normal and breath sounds normal. No  respiratory distress. She has no wheezes. She has no rales.  Abdominal: Soft. Bowel sounds are normal. She exhibits no distension and no mass. There is no tenderness.  Musculoskeletal: Normal range of motion. She exhibits no edema and no tenderness.  Lymphadenopathy:    She has no cervical adenopathy.  Neurological: She is alert and oriented to person, place, and time. She has normal reflexes. No cranial nerve deficit. Coordination normal.  Skin: Skin is warm and dry. No rash noted. She is not diaphoretic.  Psychiatric: Mood, memory and affect normal.    Lab Results  Component Value Date   TSH 1.49 01/30/2012   Lab Results  Component Value Date   WBC 7.0 01/30/2012   HGB 14.6 01/30/2012   HCT 44.4 01/30/2012   MCV 99.6 01/30/2012   PLT 223.0 01/30/2012  Lab Results  Component Value Date   CREATININE 0.8 01/30/2012   BUN 15 01/30/2012   NA 139 01/30/2012   K 4.3 01/30/2012   CL 104 01/30/2012   CO2 28 01/30/2012   Lab Results  Component Value Date   ALT 21 01/30/2012   AST 18 01/30/2012   ALKPHOS 45 01/30/2012   BILITOT 0.3 01/30/2012   Lab Results  Component Value Date   CHOL 201* 01/30/2012   Lab Results  Component Value Date   HDL 45.10 01/30/2012   Lab Results  Component Value Date   LDLCALC 108* 09/17/2011   Lab Results  Component Value Date   TRIG 118.0 01/30/2012   Lab Results  Component Value Date   CHOLHDL 4 01/30/2012     Assessment & Plan  Herpes labialis Zovirax cream 5% q 3 hours prn is prescribed  ANXIETY DEPRESSION Good response to Sertraline 50 mg daily, given refill on this and Clonazepam to use prn  PREMATURE VENTRICULAR CONTRACTIONS Well controlled on low dose Atenolol  TOBACCO ABUSE Is still smoking about 1/2 ppd discussed at length strategies for complete cessation. Try Ecig, waiting 15 minutes etc.  Knee pain, right She is scheduled to go for a second cortisone injection and we will rx Tr[facility] Advanced OA  for pain relief and she is taking Glucosamine Chondroitin  Hyperlipidemia Avoid trans fats, add Krill oil and monitor, patient given copy of labs  Obese Given low dose phentermine to try and asked to consider DASH diet, warned regarding possible SE , she will stop med if occurs  Preventative health care agreed to colonoscopy after long discussion. Referred, fasting labs reviewed, discussed need for heart healthy diet, MGM reviewed, Pap normal last year

## 2012-02-06 NOTE — Assessment & Plan Note (Signed)
Avoid trans fats, add Krill oil and monitor, patient given copy of labs

## 2012-02-06 NOTE — Assessment & Plan Note (Signed)
Given low dose phentermine to try and asked to consider DASH diet, warned regarding possible SE , she will stop med if occurs

## 2012-02-06 NOTE — Assessment & Plan Note (Signed)
Good response to Sertraline 50 mg daily, given refill on this and Clonazepam to use prn

## 2012-02-06 NOTE — Patient Instructions (Addendum)
Consider starting Mega[employer] krill oil caps by Sch[employer] daily   Preventive Care for Adults, Female A healthy lifestyle and preventive care can promote health and wellness. Preventive health guidelines for women include the following key practices.  A routine yearly physical is a good way to check with your caregiver about your health and preventive screening. It is a chance to share any concerns and updates on your health, and to receive a thorough exam.  Visit your dentist for a routine exam and preventive care every 6 months. Brush your teeth twice a day and floss once a day. Good oral hygiene prevents tooth decay and gum disease.  The frequency of eye exams is based on your age, health, family medical history, use of contact lenses, and other factors. Follow your caregiver's recommendations for frequency of eye exams.  Eat a healthy diet. Foods like vegetables, fruits, whole grains, low-fat dairy products, and lean protein foods contain the nutrients you need without too many calories. Decrease your intake of foods high in solid fats, added sugars, and salt. Eat the right amount of calories for you.Get information about a proper diet from your caregiver, if necessary.  Regular physical exercise is one of the most important things you can do for your health. Most adults should get at least 150 minutes of moderate-intensity exercise (any activity that increases your heart rate and causes you to sweat) each week. In addition, most adults need muscle-strengthening exercises on 2 or more days a week.  Maintain a healthy weight. The body mass index (BMI) is a screening tool to identify possible weight problems. It provides an estimate of body fat based on height and weight. Your caregiver can help determine your BMI, and can help you achieve or maintain a healthy weight.For adults 20 years and older:  A BMI below 18.5 is considered underweight.  A BMI of 18.5 to 24.9 is normal.  A BMI of 25 to 29.9  is considered overweight.  A BMI of 30 and above is considered obese.  Maintain normal blood lipids and cholesterol levels by exercising and minimizing your intake of saturated fat. Eat a balanced diet with plenty of fruit and vegetables. Blood tests for lipids and cholesterol should begin at age 52 and be repeated every 5 years. If your lipid or cholesterol levels are high, you are over 50, or you are at high risk for heart disease, you may need your cholesterol levels checked more frequently.Ongoing high lipid and cholesterol levels should be treated with medicines if diet and exercise are not effective.  If you smoke, find out from your caregiver how to quit. If you do not use tobacco, do not start.  If you are pregnant, do not drink alcohol. If you are breastfeeding, be very cautious about drinking alcohol. If you are not pregnant and choose to drink alcohol, do not exceed 1 drink per day. One drink is considered to be 12 ounces (355 mL) of beer, 5 ounces (148 mL) of wine, or 1.5 ounces (44 mL) of liquor.  Avoid use of street drugs. Do not share needles with anyone. Ask for help if you need support or instructions about stopping the use of drugs.  High blood pressure causes heart disease and increases the risk of stroke. Your blood pressure should be checked at least every 1 to 2 years. Ongoing high blood pressure should be treated with medicines if weight loss and exercise are not effective.  If you are 56 to 53 years old, ask your  caregiver if you should take aspirin to prevent strokes.  Diabetes screening involves taking a blood sample to check your fasting blood sugar level. This should be done once every 3 years, after age 44, if you are within normal weight and without risk factors for diabetes. Testing should be considered at a younger age or be carried out more frequently if you are overweight and have at least 1 risk factor for diabetes.  Breast cancer screening is essential  preventive care for women. You should practice "breast self-awareness." This means understanding the normal appearance and feel of your breasts and may include breast self-examination. Any changes detected, no matter how small, should be reported to a caregiver. Women in their 64s and 30s should have a clinical breast exam (CBE) by a caregiver as part of a regular health exam every 1 to 3 years. After age 5, women should have a CBE every year. Starting at age 28, women should consider having a mammography (breast X-ray test) every year. Women who have a family history of breast cancer should talk to their caregiver about genetic screening. Women at a high risk of breast cancer should talk to their caregivers about having magnetic resonance imaging (MRI) and a mammography every year.  The Pap test is a screening test for cervical cancer. A Pap test can show cell changes on the cervix that might become cervical cancer if left untreated. A Pap test is a procedure in which cells are obtained and examined from the lower end of the uterus (cervix).  Women should have a Pap test starting at age 75.  Between ages 29 and 37, Pap tests should be repeated every 2 years.  Beginning at age 53, you should have a Pap test every 3 years as long as the past 3 Pap tests have been normal.  Some women have medical problems that increase the chance of getting cervical cancer. Talk to your caregiver about these problems. It is especially important to talk to your caregiver if a new problem develops soon after your last Pap test. In these cases, your caregiver may recommend more frequent screening and Pap tests.  The above recommendations are the same for women who have or have not gotten the vaccine for human papillomavirus (HPV).  If you had a hysterectomy for a problem that was not cancer or a condition that could lead to cancer, then you no longer need Pap tests. Even if you no longer need a Pap test, a regular exam is  a good idea to make sure no other problems are starting.  If you are between ages 59 and 67, and you have had normal Pap tests going back 10 years, you no longer need Pap tests. Even if you no longer need a Pap test, a regular exam is a good idea to make sure no other problems are starting.  If you have had past treatment for cervical cancer or a condition that could lead to cancer, you need Pap tests and screening for cancer for at least 20 years after your treatment.  If Pap tests have been discontinued, risk factors (such as a new sexual partner) need to be reassessed to determine if screening should be resumed.  The HPV test is an additional test that may be used for cervical cancer screening. The HPV test looks for the virus that can cause the cell changes on the cervix. The cells collected during the Pap test can be tested for HPV. The HPV test could be  used to screen women aged 21 years and older, and should be used in women of any age who have unclear Pap test results. After the age of 31, women should have HPV testing at the same frequency as a Pap test.  Colorectal cancer can be detected and often prevented. Most routine colorectal cancer screening begins at the age of 69 and continues through age 79. However, your caregiver may recommend screening at an earlier age if you have risk factors for colon cancer. On a yearly basis, your caregiver may provide home test kits to check for hidden blood in the stool. Use of a small camera at the end of a tube, to directly examine the colon (sigmoidoscopy or colonoscopy), can detect the earliest forms of colorectal cancer. Talk to your caregiver about this at age 2, when routine screening begins. Direct examination of the colon should be repeated every 5 to 10 years through age 46, unless early forms of pre-cancerous polyps or small growths are found.  Hepatitis C blood testing is recommended for all people born from [birth year] through 1965 and any individual  with known risks for hepatitis C.  Practice safe sex. Use condoms and avoid high-risk sexual practices to reduce the spread of sexually transmitted infections (STIs). STIs include gonorrhea, chlamydia, syphilis, trichomonas, herpes, HPV, and human immunodeficiency virus (HIV). Herpes, HIV, and HPV are viral illnesses that have no cure. They can result in disability, cancer, and death. Sexually active women aged 25 and younger should be checked for chlamydia. Older women with new or multiple partners should also be tested for chlamydia. Testing for other STIs is recommended if you are sexually active and at increased risk.  Osteoporosis is a disease in which the bones lose minerals and strength with aging. This can result in serious bone fractures. The risk of osteoporosis can be identified using a bone density scan. Women ages 28 and over and women at risk for fractures or osteoporosis should discuss screening with their caregivers. Ask your caregiver whether you should take a calcium supplement or vitamin D to reduce the rate of osteoporosis.  Menopause can be associated with physical symptoms and risks. Hormone replacement therapy is available to decrease symptoms and risks. You should talk to your caregiver about whether hormone replacement therapy is right for you.  Use sunscreen with sun protection factor (SPF) of 30 or more. Apply sunscreen liberally and repeatedly throughout the day. You should seek shade when your shadow is shorter than you. Protect yourself by wearing long sleeves, pants, a wide-brimmed hat, and sunglasses year round, whenever you are outdoors.  Once a month, do a whole body skin exam, using a mirror to look at the skin on your back. Notify your caregiver of new moles, moles that have irregular borders, moles that are larger than a pencil eraser, or moles that have changed in shape or color.  Stay current with required immunizations.  Influenza. You need a dose every fall (or  winter). The composition of the flu vaccine changes each year, so being vaccinated once is not enough.  Pneumococcal polysaccharide. You need 1 to 2 doses if you smoke cigarettes or if you have certain chronic medical conditions. You need 1 dose at age 37 (or older) if you have never been vaccinated.  Tetanus, diphtheria, pertussis (Tdap, Td). Get 1 dose of Tdap vaccine if you are younger than age 35, are over 60 and have contact with an infant, are a Research scientist (physical sciences), are pregnant, or simply want to  be protected from whooping cough. After that, you need a Td booster dose every 10 years. Consult your caregiver if you have not had at least 3 tetanus and diphtheria-containing shots sometime in your life or have a deep or dirty wound.  HPV. You need this vaccine if you are a woman age 75 or younger. The vaccine is given in 3 doses over 6 months.  Measles, mumps, rubella (MMR). You need at least 1 dose of MMR if you were born in 1957 or later. You may also need a second dose.  Meningococcal. If you are age 50 to 31 and a first-year college student living in a residence hall, or have one of several medical conditions, you need to get vaccinated against meningococcal disease. You may also need additional booster doses.  Zoster (shingles). If you are age 42 or older, you should get this vaccine.  Varicella (chickenpox). If you have never had chickenpox or you were vaccinated but received only 1 dose, talk to your caregiver to find out if you need this vaccine.  Hepatitis A. You need this vaccine if you have a specific risk factor for hepatitis A virus infection or you simply wish to be protected from this disease. The vaccine is usually given as 2 doses, 6 to 18 months apart.  Hepatitis B. You need this vaccine if you have a specific risk factor for hepatitis B virus infection or you simply wish to be protected from this disease. The vaccine is given in 3 doses, usually over 6 months. Preventive  Services / Frequency Ages 37 to 51  Blood pressure check.** / Every 1 to 2 years.  Lipid and cholesterol check.** / Every 5 years beginning at age 89.  Clinical breast exam.** / Every 3 years for women in their 79s and 30s.  Pap test.** / Every 2 years from ages 47 through 14. Every 3 years starting at age 90 through age 24 or 62 with a history of 3 consecutive normal Pap tests.  HPV screening.** / Every 3 years from ages 6 through ages 15 to 42 with a history of 3 consecutive normal Pap tests.  Hepatitis C blood test.** / For any individual with known risks for hepatitis C.  Skin self-exam. / Monthly.  Influenza immunization.** / Every year.  Pneumococcal polysaccharide immunization.** / 1 to 2 doses if you smoke cigarettes or if you have certain chronic medical conditions.  Tetanus, diphtheria, pertussis (Tdap, Td) immunization. / A one-time dose of Tdap vaccine. After that, you need a Td booster dose every 10 years.  HPV immunization. / 3 doses over 6 months, if you are 62 and younger.  Measles, mumps, rubella (MMR) immunization. / You need at least 1 dose of MMR if you were born in 1957 or later. You may also need a second dose.  Meningococcal immunization. / 1 dose if you are age 31 to 64 and a first-year college student living in a residence hall, or have one of several medical conditions, you need to get vaccinated against meningococcal disease. You may also need additional booster doses.  Varicella immunization.** / Consult your caregiver.  Hepatitis A immunization.** / Consult your caregiver. 2 doses, 6 to 18 months apart.  Hepatitis B immunization.** / Consult your caregiver. 3 doses usually over 6 months. Ages 28 to 67  Blood pressure check.** / Every 1 to 2 years.  Lipid and cholesterol check.** / Every 5 years beginning at age 59.  Clinical breast exam.** / Every year after  age 62.  Mammogram.** / Every year beginning at age 90 and continuing for as long as you  are in good health. Consult with your caregiver.  Pap test.** / Every 3 years starting at age 50 through age 40 or 52 with a history of 3 consecutive normal Pap tests.  HPV screening.** / Every 3 years from ages 45 through ages 12 to 69 with a history of 3 consecutive normal Pap tests.  Fecal occult blood test (FOBT) of stool. / Every year beginning at age 49 and continuing until age 31. You may not need to do this test if you get a colonoscopy every 10 years.  Flexible sigmoidoscopy or colonoscopy.** / Every 5 years for a flexible sigmoidoscopy or every 10 years for a colonoscopy beginning at age 23 and continuing until age 54.  Hepatitis C blood test.** / For all people born from [birth year] through 1965 and any individual with known risks for hepatitis C.  Skin self-exam. / Monthly.  Influenza immunization.** / Every year.  Pneumococcal polysaccharide immunization.** / 1 to 2 doses if you smoke cigarettes or if you have certain chronic medical conditions.  Tetanus, diphtheria, pertussis (Tdap, Td) immunization.** / A one-time dose of Tdap vaccine. After that, you need a Td booster dose every 10 years.  Measles, mumps, rubella (MMR) immunization. / You need at least 1 dose of MMR if you were born in 1957 or later. You may also need a second dose.  Varicella immunization.** / Consult your caregiver.  Meningococcal immunization.** / Consult your caregiver.  Hepatitis A immunization.** / Consult your caregiver. 2 doses, 6 to 18 months apart.  Hepatitis B immunization.** / Consult your caregiver. 3 doses, usually over 6 months. Ages 57 and over  Blood pressure check.** / Every 1 to 2 years.  Lipid and cholesterol check.** / Every 5 years beginning at age 71.  Clinical breast exam.** / Every year after age 43.  Mammogram.** / Every year beginning at age 83 and continuing for as long as you are in good health. Consult with your caregiver.  Pap test.** / Every 3 years starting at age 28  through age 50 or 69 with a 3 consecutive normal Pap tests. Testing can be stopped between 65 and 70 with 3 consecutive normal Pap tests and no abnormal Pap or HPV tests in the past 10 years.  HPV screening.** / Every 3 years from ages 49 through ages 40 or 28 with a history of 3 consecutive normal Pap tests. Testing can be stopped between 65 and 70 with 3 consecutive normal Pap tests and no abnormal Pap or HPV tests in the past 10 years.  Fecal occult blood test (FOBT) of stool. / Every year beginning at age 64 and continuing until age 39. You may not need to do this test if you get a colonoscopy every 10 years.  Flexible sigmoidoscopy or colonoscopy.** / Every 5 years for a flexible sigmoidoscopy or every 10 years for a colonoscopy beginning at age 28 and continuing until age 30.  Hepatitis C blood test.** / For all people born from [birth year] through 1965 and any individual with known risks for hepatitis C.  Osteoporosis screening.** / A one-time screening for women ages 92 and over and women at risk for fractures or osteoporosis.  Skin self-exam. / Monthly.  Influenza immunization.** / Every year.  Pneumococcal polysaccharide immunization.** / 1 dose at age 12 (or older) if you have never been vaccinated.  Tetanus, diphtheria, pertussis (Tdap, Td) immunization. /  A one-time dose of Tdap vaccine if you are over 65 and have contact with an infant, are a Research scientist (physical sciences), or simply want to be protected from whooping cough. After that, you need a Td booster dose every 10 years.  Varicella immunization.** / Consult your caregiver.  Meningococcal immunization.** / Consult your caregiver.  Hepatitis A immunization.** / Consult your caregiver. 2 doses, 6 to 18 months apart.  Hepatitis B immunization.** / Check with your caregiver. 3 doses, usually over 6 months. ** Family history and personal history of risk and conditions may change your caregiver's recommendations. Document Released: 05/22/2001  Document Revised: 06/18/2011 Document Reviewed: 08/21/2010 [facility] Patient Information 2013 [place], [place].

## 2012-02-27 ENCOUNTER — Encounter: Payer: Self-pay | Admitting: Family Medicine

## 2012-02-27 ENCOUNTER — Ambulatory Visit (INDEPENDENT_AMBULATORY_CARE_PROVIDER_SITE_OTHER): Payer: Managed Care, Other (non HMO) | Admitting: Family Medicine

## 2012-02-27 VITALS — BP 119/77 | HR 74 | Temp 98.0°F | Ht 66.75 in | Wt 207.8 lb

## 2012-02-27 DIAGNOSIS — R002 Palpitations: Secondary | ICD-10-CM

## 2012-02-27 DIAGNOSIS — E669 Obesity, unspecified: Secondary | ICD-10-CM | POA: Insufficient documentation

## 2012-02-27 MED ORDER — PHENTERMINE HCL 37.5 MG PO CAPS: 37.5000 mg | ORAL_CAPSULE | ORAL | Status: DC

## 2012-02-27 NOTE — Progress Notes (Signed)
Patient ID: Andrea Bowman, female   DOB: 05/18/1960, 51 y.o.   MRN: [id number] Andrea Bowman [id number] 28-Jan-1961 02/27/2012      Progress Note-Follow Up  Subjective  Chief Complaint  Chief Complaint  Patient presents with  . Follow-up    3 week follow up on weight and BP (on Phentermine)    HPI  Caucasian female who is in today for followup on her blood pressure with phentermine. She reports she tolerates the medicine well. She did note appetite suppressant in the first week but that seemed to wear off. She denies any insomnia or worsening anxiety. She denies any chest pain, palpitations or shortness of breath. She did note some mild headaches at night with these responded to ibuprofen and had no neurologic symptoms associated. No other acute complaints. No GI upset, nausea or diarrhea noted.  Past Medical History  Diagnosis Date  . PVC (premature ventricular contraction)   . Depression   . Anxiety   . UTI'S, HX OF 05/12/2010  . Unspecified vitamin D deficiency 05/12/2010  . TOBACCO ABUSE 11/30/2009  . PREMATURE VENTRICULAR CONTRACTIONS 03/15/2010  . PERIMENOPAUSAL SYNDROME 05/12/2010  . Palpitations 11/28/2009  . MUMPS, HX OF 05/12/2010  . HEMATURIA, HX OF 05/12/2010  . CHICKENPOX, HX OF 05/12/2010  . ANXIETY DEPRESSION 11/29/2009  . ALLERGIC RHINITIS, SEASONAL 05/12/2010  . Sinusitis acute 08/24/2010  . 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 Knee pain, right 08/20/2011  . Foot pain, bilateral 08/26/2011  . Thumb pain 08/26/2011  . Herpes labialis 02/06/2012  . Hyperlipidemia 02/06/2012  . Obese 02/06/2012  . Obesity 02/27/2012    Past Surgical History  Procedure Date  . Cholecystectomy   . Abdominal surgery 51 yrs old  . Verrucous lesion      right scalp excise, benign    Family History  Problem Relation Age of Onset  . Cancer Mother     Breast  . Hypertension Father   . Other Father     CHF  . Depression Sister     previous history  of  . Anxiety disorder Sister     Previous history of  . Depression Brother   . Anxiety disorder Brother   . Ulcers Brother     PUD  . Cancer Maternal Aunt     X several w/ breast cancer, various ages  . Cancer Paternal Uncle     X several w/ prostate cancer  . Cancer Maternal Grandmother   . COPD Neg Hx     History   Social History  . Marital Status: Married    Spouse Name: N/A    Number of Children: N/A  . Years of Education: N/A   Occupational History  . Not on file.   Social History Main Topics  . Smoking status: Current Every Day Smoker -- 0.5 packs/day  . Smokeless tobacco: Never Used  . Alcohol Use: No  . Drug Use: No  . Sexually Active: Yes -- Female partner(s)   Other Topics Concern  . Not on file   Social History Narrative  . No narrative on file    Current Outpatient Prescriptions on File Prior to Visit  Medication Sig Dispense Refill  . acetaminophen (TYLENOL) 500 MG tablet Take 500 mg by mouth every 6 (six) hours as needed.      Marland Kitchen atenolol (TENORMIN) 25 MG tablet Take 25 mg by mouth 2 (two) times daily.        . clonazePAM (KLONOPIN)  0.5 MG tablet Take 1 tablet (0.5 mg total) by mouth 3 (three) times daily as needed for anxiety.  60 tablet  2  . Diclofenac Sodium 3 % GEL [employer] Advanced formula for OA: Diclofenac 3%; Baclofen 2%; Orphenadrine 5%; Bupivacaine 2%  240 g  1  . sertraline (ZOLOFT) 50 MG tablet Take 1 tablet (50 mg total) by mouth daily.  30 tablet  5  . Glucosamine-Chondroit-Vit C-Mn (GLUCOSAMINE CHONDR 500 COMPLEX PO) Take 2,000 mg by mouth daily.        Allergies  Allergen Reactions  . Prednisone     REACTION: face turns red    Review of Systems  Review of Systems  Constitutional: Negative for fever and malaise/fatigue.  HENT: Negative for congestion.        Notes a couple of mild headaches during the night responded to Ibuprofen  Eyes: Negative for discharge.  Respiratory: Negative for shortness of breath.     Cardiovascular: Negative for chest pain, palpitations and leg swelling.  Gastrointestinal: Negative for nausea, abdominal pain and diarrhea.  Genitourinary: Negative for dysuria.  Musculoskeletal: Negative for falls.  Skin: Negative for rash.  Neurological: Positive for headaches. Negative for loss of consciousness.  Endo/Heme/Allergies: Negative for polydipsia.  Psychiatric/Behavioral: Negative for depression and suicidal ideas. The patient is not nervous/anxious and does not have insomnia.     Objective  BP 119/77  Pulse 74  Temp 98 F (36.7 C) (Temporal)  Ht 5' 6.75" (1.695 m)  Wt 207 lb 12.8 oz (94.257 kg)  BMI 32.79 kg/m2  SpO2 98%  Physical Exam  Physical Exam  Constitutional: She is well-developed, well-nourished, and in no distress. No distress.  HENT:  Left Ear: External ear normal.  Nose: Nose normal.  Mouth/Throat: No oropharyngeal exudate.  Eyes: EOM are normal. Left eye exhibits no discharge. No scleral icterus.  Neck: No JVD present. No tracheal deviation present.  Cardiovascular: Normal heart sounds and intact distal pulses.   Pulmonary/Chest: Breath sounds normal. No respiratory distress. She has no wheezes. She has no rales.  Abdominal: She exhibits no distension and no mass. There is tenderness. There is no guarding.  Musculoskeletal: She exhibits no edema and no tenderness.  Lymphadenopathy:    She has no cervical adenopathy.  Skin: No rash noted. No erythema.    Lab Results  Component Value Date   TSH 1.49 01/30/2012   Lab Results  Component Value Date   WBC 7.0 01/30/2012   HGB 14.6 01/30/2012   HCT 44.4 01/30/2012   MCV 99.6 01/30/2012   PLT 223.0 01/30/2012   Lab Results  Component Value Date   CREATININE 0.8 01/30/2012   BUN 15 01/30/2012   NA 139 01/30/2012   K 4.3 01/30/2012   CL 104 01/30/2012   CO2 28 01/30/2012   Lab Results  Component Value Date   ALT 21 01/30/2012   AST 18 01/30/2012   ALKPHOS 45 01/30/2012   BILITOT  0.3 01/30/2012   Lab Results  Component Value Date   CHOL 201* 01/30/2012   Lab Results  Component Value Date   HDL 45.10 01/30/2012   Lab Results  Component Value Date   LDLCALC 108* 09/17/2011   Lab Results  Component Value Date   TRIG 118.0 01/30/2012   Lab Results  Component Value Date   CHOLHDL 4 01/30/2012     Assessment & Plan  PALPITATIONS No flares on Phentermine  Obesity 3# weight loss in 3 weeks, encouraged DASH diet and increased  activity. Tolerated Phentermine but the appetite suppressant wore off after there first week. Will increase to 37.5 mg daily. Recheck vitals in 3 weeks or sooner as needed

## 2012-02-27 NOTE — Assessment & Plan Note (Signed)
No flares on Phentermine

## 2012-02-27 NOTE — Patient Instructions (Addendum)
Obesity Obesity is defined as having too much total body fat and a body mass index (BMI) of 30 or more. BMI is an estimate of body fat and is calculated from your height and weight. Obesity happens when you consume more calories than you can burn by exercising or performing daily physical tasks. Prolonged obesity can cause major illnesses or emergencies, such as:   A stroke.  Heart disease.  Diabetes.  Cancer.  Arthritis.  High blood pressure (hypertension).  High cholesterol.  Sleep apnea.  Erectile dysfunction.  Infertility problems. CAUSES   Regularly eating unhealthy foods.  Physical inactivity.  Certain disorders, such as an underactive thyroid (hypothyroidism), Cushing's syndrome, and polycystic ovarian syndrome.  Certain medicines, such as steroids, some depression medicines, and antipsychotics.  Genetics.  Lack of sleep. DIAGNOSIS  A caregiver can diagnose obesity after calculating your BMI. Obesity will be diagnosed if your BMI is 30 or higher.  There are other methods of measuring obesity levels. Some other methods include measuring your skin fold thickness, your waist circumference, and comparing your hip circumference to your waist circumference. TREATMENT  A healthy treatment program includes some or all of the following:  Long-term dietary changes.  Exercise and physical activity.  Behavioral and lifestyle changes.  Medicine only under the supervision of your caregiver. Medicines may help, but only if they are used with diet and exercise programs. An unhealthy treatment program includes:  Fasting.  Fad diets.  Supplements and drugs. These choices do not succeed in long-term weight control.  HOME CARE INSTRUCTIONS   Exercise and perform physical activity as directed by your caregiver. To increase physical activity, try the following:  Use stairs instead of elevators.  Park farther away from store entrances.  Garden, bike, or walk instead of  watching television or using the computer.  Eat healthy, low-calorie foods and drinks on a regular basis. Eat more fruits and vegetables. Use low-calorie cookbooks or take healthy cooking classes.  Limit fast food, sweets, and processed snack foods.  Eat smaller portions.  Keep a daily journal of everything you eat. There are many free websites to help you with this. It may be helpful to measure your foods so you can determine if you are eating the correct portion sizes.  Avoid drinking alcohol. Drink more water and drinks without calories.  Take vitamins and supplements only as recommended by your caregiver.  Weight-loss support groups, Registered Dieticians, counselors, and stress reduction education can also be very helpful. SEEK IMMEDIATE MEDICAL CARE IF:  You have chest pain or tightness.  You have trouble breathing or feel short of breath.  You have weakness or leg numbness.  You feel confused or have trouble talking.  You have sudden changes in your vision. MAKE SURE YOU:  Understand these instructions.  Will watch your condition.  Will get help right away if you are not doing well or get worse. Document Released: 05/03/2004 Document Revised: 09/25/2011 Document Reviewed: 05/02/2011 [employer] Patient Information 2013 [employer].  

## 2012-02-27 NOTE — Assessment & Plan Note (Signed)
3# weight loss in 3 weeks, encouraged DASH diet and increased activity. Tolerated Phentermine but the appetite suppressant wore off after there first week. Will increase to 37.5 mg daily. Recheck vitals in 3 weeks or sooner as needed

## 2012-03-24 ENCOUNTER — Encounter: Payer: Self-pay | Admitting: Family Medicine

## 2012-03-24 ENCOUNTER — Ambulatory Visit (INDEPENDENT_AMBULATORY_CARE_PROVIDER_SITE_OTHER): Payer: Managed Care, Other (non HMO) | Admitting: Family Medicine

## 2012-03-24 VITALS — BP 127/79 | HR 75 | Temp 97.8°F | Ht 66.75 in | Wt 207.0 lb

## 2012-03-24 DIAGNOSIS — F341 Dysthymic disorder: Secondary | ICD-10-CM

## 2012-03-24 DIAGNOSIS — M25561 Pain in right knee: Secondary | ICD-10-CM

## 2012-03-24 DIAGNOSIS — E785 Hyperlipidemia, unspecified: Secondary | ICD-10-CM

## 2012-03-24 DIAGNOSIS — M25569 Pain in unspecified knee: Secondary | ICD-10-CM

## 2012-03-24 DIAGNOSIS — E669 Obesity, unspecified: Secondary | ICD-10-CM

## 2012-03-24 DIAGNOSIS — F17200 Nicotine dependence, unspecified, uncomplicated: Secondary | ICD-10-CM

## 2012-03-24 MED ORDER — PHENTERMINE HCL 37.5 MG PO CAPS: 37.5000 mg | ORAL_CAPSULE | ORAL | Status: DC

## 2012-03-24 NOTE — Assessment & Plan Note (Signed)
Continues with weight loss and is tolerating Phentermine, will give 3 month supply and reassess at next visit.

## 2012-03-24 NOTE — Patient Instructions (Addendum)
Nicotine Addiction Nicotine can act as both a stimulant (excites/activates) and a sedative (calms/quiets). Immediately after exposure to nicotine, there is a "kick" caused in part by the drug's stimulation of the adrenal glands and resulting discharge of adrenaline (epinephrine). The rush of adrenaline stimulates the body and causes a sudden release of sugar. This means that smokers are always slightly hyperglycemic. Hyperglycemic means that the blood sugar is high, just like in diabetics. Nicotine also decreases the amount of insulin which helps control sugar levels in the body. There is an increase in blood pressure, breathing, and the rate of heart beats.  In addition, nicotine indirectly causes a release of dopamine in the brain that controls pleasure and motivation. A similar reaction is seen with other drugs of abuse, such as cocaine and heroin. This dopamine release is thought to cause the pleasurable sensations when smoking. In some different cases, nicotine can also create a calming effect, depending on sensitivity of the smoker's nervous system and the dose of nicotine taken. WHAT HAPPENS WHEN NICOTINE IS TAKEN FOR LONG PERIODS OF TIME?  Long-term use of nicotine results in addiction. It is difficult to stop.  Repeated use of nicotine creates tolerance. Higher doses of nicotine are needed to get the "kick." When nicotine use is stopped, withdrawal may last a month or more. Withdrawal may begin within a few hours after the last cigarette. Symptoms peak within the first few days and may lessen within a few weeks. For some people, however, symptoms may last for months or longer. Withdrawal symptoms include:   Irritability.  Craving.  Learning and attention deficits.  Sleep disturbances.  Increased appetite. Craving for tobacco may last for 6 months or longer. Many behaviors done while using nicotine can also play a part in the severity of withdrawal symptoms. For some people, the feel,  smell, and sight of a cigarette and the ritual of obtaining, handling, lighting, and smoking the cigarette are closely linked with the pleasure of smoking. When stopped, they also miss the related behaviors which make the withdrawal or craving worse. While nicotine gum and patches may lessen the drug aspects of withdrawal, cravings often persist. WHAT ARE THE MEDICAL CONSEQUENCES OF NICOTINE USE?  Nicotine addiction accounts for one-third of all cancers. The top cancer caused by tobacco is lung cancer. Lung cancer is the number one cancer killer of both men and women.  Smoking is also associated with cancers of the:  Mouth.  Pharynx.  Larynx.  Esophagus.  Stomach.  Pancreas.  Cervix.  Kidney.  Ureter.  Bladder.  Smoking also causes lung diseases such as lasting (chronic) bronchitis and emphysema.  It worsens asthma in adults and children.  Smoking increases the risk of heart disease, including:  Stroke.  Heart attack.  Vascular disease.  Aneurysm.  Passive or secondary smoke can also increase medical risks including:  Asthma in children.  Sudden Infant Death Syndrome (SIDS).  Additionally, dropped cigarettes are the leading cause of residential fire fatalities.  Nicotine poisoning has been reported from accidental ingestion of tobacco products by children and pets. Death usually results in a few minutes from respiratory failure (when a person stops breathing) caused by paralysis. TREATMENT   Medication. Nicotine replacement medicines such as nicotine gum and the patch are used to stop smoking. These medicines gradually lower the dosage of nicotine in the body. These medicines do not contain the carbon monoxide and other toxins found in tobacco smoke.  Hypnotherapy.  Relaxation therapy.  Nicotine Anonymous (a 12-step support   program). Find times and locations in your local yellow pages. Document Released: 11/30/2003 Document Revised: 06/18/2011 Document  Reviewed: 04/23/2007 [facility] Patient Information 2013 [place], [place].

## 2012-03-24 NOTE — Progress Notes (Signed)
Patient ID: Andrea Bowman, female   DOB: Nov 08, 1960, 51 y.o.   MRN: [id number] Andrea Bowman [id number] 1961/01/30 03/24/2012      Progress Note-Follow Up  Subjective  Chief Complaint  Chief Complaint  Patient presents with  . Follow-up    1 month    HPI  Patient is a 51 year old Caucasian female who is in today for followup. She does continue to struggle ongoing he continues but has cut back from a pack a day to about half pack by using a cigarette. He is going to continue to try to cut back further. Has been struggling with right knee pain and swelling. She denies any trauma. Topical treatments are not helpful. We tried meloxicam and that was not helpful. Celebrex has been more helpful. It helps with the swelling and she was the only has minimal pain. Worst pain is walking on stairs. No warmth or redness. Otherwise she says she's doing well. She tolerated increase in phentermine 37.5 mg. Denies headache, anxiety, may insomnia, chest pain, palpitations, shortness of breath, GI or GU complaints today.  Past Medical History  Diagnosis Date  . PVC (premature ventricular contraction)   . Depression   . Anxiety   . UTI'S, HX OF 05/12/2010  . Unspecified vitamin D deficiency 05/12/2010  . TOBACCO ABUSE 11/30/2009  . PREMATURE VENTRICULAR CONTRACTIONS 03/15/2010  . PERIMENOPAUSAL SYNDROME 05/12/2010  . Palpitations 11/28/2009  . MUMPS, HX OF 05/12/2010  . HEMATURIA, HX OF 05/12/2010  . CHICKENPOX, HX OF 05/12/2010  . ANXIETY DEPRESSION 11/29/2009  . ALLERGIC RHINITIS, SEASONAL 05/12/2010  . Sinusitis acute 08/24/2010  . 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 Knee pain, right 08/20/2011  . Foot pain, bilateral 08/26/2011  . Thumb pain 08/26/2011  . Herpes labialis 02/06/2012  . Hyperlipidemia 02/06/2012  . Obese 02/06/2012  . Obesity 02/27/2012    Past Surgical History  Procedure Date  . Cholecystectomy   . Abdominal surgery 51 yrs old  . Verrucous lesion       right scalp excise, benign    Family History  Problem Relation Age of Onset  . Cancer Mother     Breast  . Hypertension Father   . Other Father     CHF  . Depression Sister     previous history of  . Anxiety disorder Sister     Previous history of  . Depression Brother   . Anxiety disorder Brother   . Ulcers Brother     PUD  . Cancer Maternal Aunt     X several w/ breast cancer, various ages  . Cancer Paternal Uncle     X several w/ prostate cancer  . Cancer Maternal Grandmother   . COPD Neg Hx     History   Social History  . Marital Status: Married    Spouse Name: N/A    Number of Children: N/A  . Years of Education: N/A   Occupational History  . Not on file.   Social History Main Topics  . Smoking status: Current Every Day Smoker -- 0.5 packs/day  . Smokeless tobacco: Never Used  . Alcohol Use: No  . Drug Use: No  . Sexually Active: Yes -- Female partner(s)   Other Topics Concern  . Not on file   Social History Narrative  . No narrative on file    Current Outpatient Prescriptions on File Prior to Visit  Medication Sig Dispense Refill  . acetaminophen (TYLENOL) 500 MG tablet Take  500 mg by mouth every 6 (six) hours as needed.      Marland Kitchen atenolol (TENORMIN) 25 MG tablet Take 25 mg by mouth 2 (two) times daily.        . clonazePAM (KLONOPIN) 0.5 MG tablet Take 1 tablet (0.5 mg total) by mouth 3 (three) times daily as needed for anxiety.  60 tablet  2  . KRILL OIL PO Take by mouth daily.      . sertraline (ZOLOFT) 50 MG tablet Take 1 tablet (50 mg total) by mouth daily.  30 tablet  5  . Diclofenac Sodium 3 % GEL [employer] Advanced formula for OA: Diclofenac 3%; Baclofen 2%; Orphenadrine 5%; Bupivacaine 2%  240 g  1  . Glucosamine-Chondroit-Vit C-Mn (GLUCOSAMINE CHONDR 500 COMPLEX PO) Take 2,000 mg by mouth daily.        Allergies  Allergen Reactions  . Prednisone     REACTION: face turns red    Review of Systems  Review of Systems   Constitutional: Negative for fever and malaise/fatigue.  HENT: Negative for congestion.   Eyes: Negative for discharge.  Respiratory: Negative for shortness of breath.   Cardiovascular: Negative for chest pain, palpitations and leg swelling.  Gastrointestinal: Negative for nausea, abdominal pain and diarrhea.  Genitourinary: Negative for dysuria.  Musculoskeletal: Positive for joint pain. Negative for falls.       Right knee swelling and mild pressure intermittently  Skin: Negative for rash.  Neurological: Negative for loss of consciousness and headaches.  Endo/Heme/Allergies: Negative for polydipsia.  Psychiatric/Behavioral: Negative for depression and suicidal ideas. The patient is not nervous/anxious and does not have insomnia.     Objective  BP 127/79  Pulse 75  Temp 97.8 F (36.6 C) (Temporal)  Ht 5' 6.75" (1.695 m)  Wt 207 lb (93.895 kg)  BMI 32.66 kg/m2  SpO2 98%  Physical Exam  Physical Exam  Constitutional: She is oriented to person, place, and time and well-developed, well-nourished, and in no distress. No distress.  HENT:  Head: Normocephalic and atraumatic.  Eyes: Conjunctivae normal are normal.  Neck: Neck supple. No thyromegaly present.  Cardiovascular: Normal rate, regular rhythm and normal heart sounds.   No murmur heard. Pulmonary/Chest: Effort normal and breath sounds normal. She has no wheezes.  Abdominal: She exhibits no distension and no mass.  Musculoskeletal: She exhibits no edema.  Lymphadenopathy:    She has no cervical adenopathy.  Neurological: She is alert and oriented to person, place, and time.  Skin: Skin is warm and dry. No rash noted. She is not diaphoretic.  Psychiatric: Memory, affect and judgment normal.    Lab Results  Component Value Date   TSH 1.49 01/30/2012   Lab Results  Component Value Date   WBC 7.0 01/30/2012   HGB 14.6 01/30/2012   HCT 44.4 01/30/2012   MCV 99.6 01/30/2012   PLT 223.0 01/30/2012   Lab Results   Component Value Date   CREATININE 0.8 01/30/2012   BUN 15 01/30/2012   NA 139 01/30/2012   K 4.3 01/30/2012   CL 104 01/30/2012   CO2 28 01/30/2012   Lab Results  Component Value Date   ALT 21 01/30/2012   AST 18 01/30/2012   ALKPHOS 45 01/30/2012   BILITOT 0.3 01/30/2012   Lab Results  Component Value Date   CHOL 201* 01/30/2012   Lab Results  Component Value Date   HDL 45.10 01/30/2012   Lab Results  Component Value Date   LDLCALC 108* 09/17/2011  Lab Results  Component Value Date   TRIG 118.0 01/30/2012   Lab Results  Component Value Date   CHOLHDL 4 01/30/2012     Assessment & Plan  Knee pain, right Just started on Celebrex 200 mg daily in am in past week. Has been helping with swelling but does not have any significant pain most of the time. Can use the [employer] later in the day twice as needed. Is going to arrange a third ortho appt with [facility] before she decides on definitive treatment  Obesity Continues with weight loss and is tolerating Phentermine, will give 3 month supply and reassess at next visit.  Hyperlipidemia Mild, is taking Krill oil routinely and encouraged to increase exercise  TOBACCO ABUSE Down to 1/2 ppd encouraged ongoing efforts  ANXIETY DEPRESSION Doing well on Sertraline no changes today

## 2012-03-24 NOTE — Assessment & Plan Note (Signed)
Mild, is taking Krill oil routinely and encouraged to increase exercise

## 2012-03-24 NOTE — Assessment & Plan Note (Signed)
Just started on Celebrex 200 mg daily in am in past week. Has been helping with swelling but does not have any significant pain most of the time. Can use the [employer] later in the day twice as needed. Is going to arrange a third ortho appt with [facility] before she decides on definitive treatment

## 2012-03-24 NOTE — Assessment & Plan Note (Signed)
Down to 1/2 ppd encouraged ongoing efforts

## 2012-03-24 NOTE — Assessment & Plan Note (Signed)
Doing well on Sertraline no changes today.  

## 2012-04-09 DIAGNOSIS — M2241 Chondromalacia patellae, right knee: Secondary | ICD-10-CM

## 2012-04-09 HISTORY — DX: Chondromalacia patellae, right knee: M22.41

## 2012-04-18 ENCOUNTER — Ambulatory Visit (INDEPENDENT_AMBULATORY_CARE_PROVIDER_SITE_OTHER): Payer: Managed Care, Other (non HMO) | Admitting: Family Medicine

## 2012-04-18 ENCOUNTER — Encounter: Payer: Self-pay | Admitting: Family Medicine

## 2012-04-18 VITALS — BP 130/80 | HR 64 | Temp 98.6°F | Ht 66.75 in | Wt 207.0 lb

## 2012-04-18 DIAGNOSIS — L299 Pruritus, unspecified: Secondary | ICD-10-CM

## 2012-04-18 NOTE — Patient Instructions (Addendum)
Zyrtec or Allegra (180mg )--OTC. Pepcid OTC 150mg  twice per day. At bedtime, you may take 25mg  of benadryl if needed.   If rash appears then call and let us know or return.

## 2012-04-18 NOTE — Progress Notes (Signed)
OFFICE NOTE  04/18/2012  CC:  Chief Complaint  Patient presents with  . itching    palms, trunk, neck; palms worst; no rash; using benadryl that only take edge off of itching; ? celebrex-1/9 would have been day 30; none since Wednesday     HPI: Patient is a 52 y.o. Caucasian female who is here for itching.   Onset approx 3 d/a, palms, around neck, mid and lower trunk and back.  Minimal itching on lower legs.  No itching on feet or thighs or GU area/buttocks.  Hot water makes hands itch worse.  Occipital scalp itching some. No rash has been seen.  Benadryl has helped a little bit.   Celebrex is her newest med and she has been on it almost 1 mo, stopped it yesterday when the itching started. No known recent contact irritants/allergens, no new foods.   Pertinent PMH:  Past Medical History  Diagnosis Date  . PVC (premature ventricular contraction)   . Depression   . Anxiety   . UTI'S, HX OF 05/12/2010  . Unspecified vitamin D deficiency 05/12/2010  . TOBACCO ABUSE 11/30/2009  . PREMATURE VENTRICULAR CONTRACTIONS 03/15/2010  . PERIMENOPAUSAL SYNDROME 05/12/2010  . Palpitations 11/28/2009  . MUMPS, HX OF 05/12/2010  . HEMATURIA, HX OF 05/12/2010  . CHICKENPOX, HX OF 05/12/2010  . ANXIETY DEPRESSION 11/29/2009  . ALLERGIC RHINITIS, SEASONAL 05/12/2010  . Sinusitis acute 08/24/2010  . 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 Knee pain, right 08/20/2011  . Foot pain, bilateral 08/26/2011  . Thumb pain 08/26/2011  . Herpes labialis 02/06/2012  . Hyperlipidemia 02/06/2012  . Obese 02/06/2012  . Obesity 02/27/2012    MEDS: **Pt not taking voltaren gel** Outpatient Prescriptions Prior to Visit  Medication Sig Dispense Refill  . acetaminophen (TYLENOL) 500 MG tablet Take 500 mg by mouth every 6 (six) hours as needed.      Marland Kitchen atenolol (TENORMIN) 25 MG tablet Take 25 mg by mouth daily.       . clonazePAM (KLONOPIN) 0.5 MG tablet Take 1 tablet (0.5 mg total) by mouth  3 (three) times daily as needed for anxiety.  60 tablet  2  . Glucosamine-Chondroit-Vit C-Mn (GLUCOSAMINE CHONDR 500 COMPLEX PO) Take 2,000 mg by mouth daily.      . phentermine 37.5 MG capsule Take 1 capsule (37.5 mg total) by mouth every morning.  30 capsule  2  . sertraline (ZOLOFT) 50 MG tablet Take 1 tablet (50 mg total) by mouth daily.  30 tablet  5  . acyclovir ointment (ZOVIRAX) 5 %       . celecoxib (CELEBREX) 200 MG capsule Take 200 mg by mouth daily.      . Diclofenac Sodium 3 % GEL [employer] Advanced formula for OA: Diclofenac 3%; Baclofen 2%; Orphenadrine 5%; Bupivacaine 2%  240 g  1  . KRILL OIL PO Take by mouth daily.       Last reviewed on 04/18/2012 10:26 AM by Jeoffrey Massed, MD  PE: Blood pressure 130/80, pulse 64, temperature 98.6 F (37 C), height 5' 6.75" (1.695 m), weight 207 lb (93.895 kg), SpO2 97.00%. Gen: Alert, well appearing.  Patient is oriented to person, place, time, and situation. ENT:   Eyes: no injection, icteris, swelling, or exudate.  EOMI, PERRLA. Nose: no drainage or turbinate edema/swelling.  No injection or focal lesion.  Mouth: lips without lesion/swelling.  Oral mucosa pink and moist. Oropharynx without erythema, exudate, or swelling.  Neck - No  masses or thyromegaly or limitation in range of motion CV: RRR, no m/r/g.   LUNGS: CTA bilat, nonlabored resps, good aeration in all lung fields. SKIN: No rash.  NO discoloration.  NO pallor or jaundice.  IMPRESSION AND PLAN:  Itchy skin No rash.  Possible reaction to celebrex. Remain off celebrex. Zyrtec or Allegra (180mg )--OTC. Pepcid OTC 150mg  twice per day. At bedtime, you may take 25mg  of benadryl if needed.   If rash appears then call and let us know or return.     An After Visit Summary was printed and given to the patient.  FOLLOW UP: prn

## 2012-04-21 DIAGNOSIS — L299 Pruritus, unspecified: Secondary | ICD-10-CM | POA: Insufficient documentation

## 2012-04-21 NOTE — Assessment & Plan Note (Addendum)
No rash.  Possible reaction to celebrex. Remain off celebrex. Zyrtec or Allegra (180mg )--OTC. Pepcid OTC 150mg  twice per day. At bedtime, you may take 25mg  of benadryl if needed.   If rash appears then call and let us know or return.

## 2012-04-29 ENCOUNTER — Encounter (HOSPITAL_BASED_OUTPATIENT_CLINIC_OR_DEPARTMENT_OTHER): Payer: Self-pay | Admitting: *Deleted

## 2012-05-01 ENCOUNTER — Other Ambulatory Visit: Payer: Self-pay | Admitting: Orthopedic Surgery

## 2012-05-02 ENCOUNTER — Encounter (HOSPITAL_BASED_OUTPATIENT_CLINIC_OR_DEPARTMENT_OTHER): Payer: Self-pay | Admitting: *Deleted

## 2012-05-02 ENCOUNTER — Ambulatory Visit (HOSPITAL_BASED_OUTPATIENT_CLINIC_OR_DEPARTMENT_OTHER): Payer: Managed Care, Other (non HMO) | Admitting: *Deleted

## 2012-05-02 ENCOUNTER — Ambulatory Visit (HOSPITAL_BASED_OUTPATIENT_CLINIC_OR_DEPARTMENT_OTHER)
Admission: RE | Admit: 2012-05-02 | Discharge: 2012-05-02 | Disposition: A | Discharge status: Home / Self Care | Payer: Managed Care, Other (non HMO) | Source: Ambulatory Visit | Attending: Orthopedic Surgery | Admitting: Orthopedic Surgery

## 2012-05-02 ENCOUNTER — Encounter (HOSPITAL_BASED_OUTPATIENT_CLINIC_OR_DEPARTMENT_OTHER)
Admission: RE | Disposition: A | Discharge status: Home / Self Care | Payer: Self-pay | Source: Ambulatory Visit | Attending: Orthopedic Surgery

## 2012-05-02 DIAGNOSIS — M94261 Chondromalacia, right knee: Secondary | ICD-10-CM

## 2012-05-02 DIAGNOSIS — M1710 Unilateral primary osteoarthritis, unspecified knee: Secondary | ICD-10-CM | POA: Insufficient documentation

## 2012-05-02 DIAGNOSIS — M2240 Chondromalacia patellae, unspecified knee: Secondary | ICD-10-CM | POA: Insufficient documentation

## 2012-05-02 DIAGNOSIS — M25569 Pain in unspecified knee: Secondary | ICD-10-CM | POA: Insufficient documentation

## 2012-05-02 DIAGNOSIS — F17200 Nicotine dependence, unspecified, uncomplicated: Secondary | ICD-10-CM | POA: Insufficient documentation

## 2012-05-02 HISTORY — DX: Menopausal and female climacteric states: N95.1

## 2012-05-02 HISTORY — DX: Osteoarthritis of knee, unspecified: M17.9

## 2012-05-02 HISTORY — DX: Adverse effect of unspecified anesthetic, initial encounter: T41.45XA

## 2012-05-02 HISTORY — DX: Periapical abscess without sinus: K04.7

## 2012-05-02 HISTORY — PX: KNEE ARTHROSCOPY WITH LATERAL RELEASE: SHX5649

## 2012-05-02 HISTORY — DX: Presence of dental prosthetic device (complete) (partial): Z97.2

## 2012-05-02 HISTORY — DX: Other complications of anesthesia, initial encounter: T88.59XA

## 2012-05-02 HISTORY — DX: Chondromalacia patellae, right knee: M22.41

## 2012-05-02 HISTORY — DX: Unilateral primary osteoarthritis, unspecified knee: M17.10

## 2012-05-02 SURGERY — ARTHROSCOPY, KNEE, WITH LATERAL RETINACULUM RELEASE
Anesthesia: General | Site: Knee | Laterality: Right | Wound class: Clean

## 2012-05-02 MED ORDER — ONDANSETRON HCL 4 MG/2ML IJ SOLN
INTRAMUSCULAR | Status: DC | PRN
  Administered 2012-05-02: 4 mg via INTRAVENOUS

## 2012-05-02 MED ORDER — LACTATED RINGERS IV SOLN
INTRAVENOUS | Status: DC
  Administered 2012-05-02 (×3): via INTRAVENOUS

## 2012-05-02 MED ORDER — BUPIVACAINE HCL (PF) 0.5 % IJ SOLN
INTRAMUSCULAR | Status: DC | PRN
  Administered 2012-05-02: 20 mL

## 2012-05-02 MED ORDER — MIDAZOLAM HCL 2 MG/2ML IJ SOLN: 1.0000 mg | INTRAMUSCULAR | Status: DC | PRN

## 2012-05-02 MED ORDER — HYDROMORPHONE HCL PF 1 MG/ML IJ SOLN
0.2500 mg | INTRAMUSCULAR | Status: DC | PRN
  Administered 2012-05-02 (×4): 0.5 mg via INTRAVENOUS

## 2012-05-02 MED ORDER — CEFAZOLIN SODIUM-DEXTROSE 2-3 GM-% IV SOLR
2.0000 g | INTRAVENOUS | Status: AC
  Administered 2012-05-02: 2 g via INTRAVENOUS

## 2012-05-02 MED ORDER — MIDAZOLAM HCL 5 MG/5ML IJ SOLN
INTRAMUSCULAR | Status: DC | PRN
  Administered 2012-05-02: 1 mg via INTRAVENOUS

## 2012-05-02 MED ORDER — OXYCODONE-ACETAMINOPHEN 5-325 MG PO TABS: 1.0000 | ORAL_TABLET | Freq: Four times a day (QID) | ORAL | Status: DC | PRN

## 2012-05-02 MED ORDER — OXYCODONE HCL 5 MG/5ML PO SOLN: 5.0000 mg | Freq: Once | ORAL | Status: AC | PRN

## 2012-05-02 MED ORDER — POVIDONE-IODINE 7.5 % EX SOLN: Freq: Once | CUTANEOUS | Status: DC

## 2012-05-02 MED ORDER — OXYCODONE HCL 5 MG PO TABS
5.0000 mg | ORAL_TABLET | Freq: Once | ORAL | Status: AC | PRN
  Administered 2012-05-02: 5 mg via ORAL

## 2012-05-02 MED ORDER — FENTANYL CITRATE 0.05 MG/ML IJ SOLN
INTRAMUSCULAR | Status: DC | PRN
  Administered 2012-05-02: 25 ug via INTRAVENOUS
  Administered 2012-05-02: 50 ug via INTRAVENOUS
  Administered 2012-05-02 (×2): 25 ug via INTRAVENOUS

## 2012-05-02 MED ORDER — FENTANYL CITRATE 0.05 MG/ML IJ SOLN: 50.0000 ug | INTRAMUSCULAR | Status: DC | PRN

## 2012-05-02 MED ORDER — LIDOCAINE HCL (CARDIAC) 20 MG/ML IV SOLN
INTRAVENOUS | Status: DC | PRN
  Administered 2012-05-02: 100 mg via INTRAVENOUS

## 2012-05-02 MED ORDER — EPHEDRINE SULFATE 50 MG/ML IJ SOLN
INTRAMUSCULAR | Status: DC | PRN
  Administered 2012-05-02: 5 mg via INTRAVENOUS
  Administered 2012-05-02: 10 mg via INTRAVENOUS

## 2012-05-02 MED ORDER — PROPOFOL 10 MG/ML IV BOLUS
INTRAVENOUS | Status: DC | PRN
  Administered 2012-05-02: 200 mg via INTRAVENOUS

## 2012-05-02 MED ORDER — SODIUM CHLORIDE 0.9 % IR SOLN
Status: DC | PRN
  Administered 2012-05-02: 6000 mL

## 2012-05-02 MED ORDER — ONDANSETRON HCL 4 MG/2ML IJ SOLN: 4.0000 mg | Freq: Once | INTRAMUSCULAR | Status: DC | PRN

## 2012-05-02 SURGICAL SUPPLY — 41 items
BANDAGE ELASTIC 6 VELCRO ST LF (GAUZE/BANDAGES/DRESSINGS) ×2 IMPLANT
BLADE 4.2CUDA (BLADE) IMPLANT
BLADE GREAT WHITE 4.2 (BLADE) ×2 IMPLANT
CANISTER OMNI JUG 16 LITER (MISCELLANEOUS) ×2 IMPLANT
CANISTER SUCTION 2500CC (MISCELLANEOUS) IMPLANT
CLOTH BEACON ORANGE TIMEOUT ST (SAFETY) ×2 IMPLANT
CUTTER MENISCUS  4.2MM (BLADE)
CUTTER MENISCUS 4.2MM (BLADE) IMPLANT
DRAPE ARTHROSCOPY W/POUCH 114 (DRAPES) ×2 IMPLANT
DRSG EMULSION OIL 3X3 NADH (GAUZE/BANDAGES/DRESSINGS) ×2 IMPLANT
DURAPREP 26ML APPLICATOR (WOUND CARE) ×2 IMPLANT
ELECT MENISCUS 165MM 90D (ELECTRODE) ×2 IMPLANT
ELECT REM PT RETURN 9FT ADLT (ELECTROSURGICAL) ×2
ELECTRODE REM PT RTRN 9FT ADLT (ELECTROSURGICAL) ×1 IMPLANT
GLOVE BIO SURGEON STRL SZ 6.5 (GLOVE) ×2 IMPLANT
GLOVE BIOGEL PI IND STRL 7.0 (GLOVE) ×1 IMPLANT
GLOVE BIOGEL PI IND STRL 8 (GLOVE) ×2 IMPLANT
GLOVE BIOGEL PI INDICATOR 7.0 (GLOVE) ×1
GLOVE BIOGEL PI INDICATOR 8 (GLOVE) ×2
GLOVE ECLIPSE 7.5 STRL STRAW (GLOVE) ×4 IMPLANT
GOWN BRE IMP PREV XXLGXLNG (GOWN DISPOSABLE) ×2 IMPLANT
GOWN PREVENTION PLUS XLARGE (GOWN DISPOSABLE) ×2 IMPLANT
GOWN PREVENTION PLUS XXLARGE (GOWN DISPOSABLE) ×2 IMPLANT
HOLDER KNEE FOAM BLUE (MISCELLANEOUS) ×2 IMPLANT
IV NS IRRIG 3000ML ARTHROMATIC (IV SOLUTION) ×4 IMPLANT
KNEE WRAP E Z 3 GEL PACK (MISCELLANEOUS) ×2 IMPLANT
NDL SAFETY ECLIPSE 18X1.5 (NEEDLE) IMPLANT
NEEDLE HYPO 18GX1.5 SHARP (NEEDLE)
PACK ARTHROSCOPY DSU (CUSTOM PROCEDURE TRAY) ×2 IMPLANT
PACK BASIN DAY SURGERY FS (CUSTOM PROCEDURE TRAY) ×2 IMPLANT
PAD CAST 4YDX4 CTTN HI CHSV (CAST SUPPLIES) ×1 IMPLANT
PADDING CAST COTTON 4X4 STRL (CAST SUPPLIES) ×1
PENCIL BUTTON HOLSTER BLD 10FT (ELECTRODE) ×2 IMPLANT
SET ARTHROSCOPY TUBING (MISCELLANEOUS) ×1
SET ARTHROSCOPY TUBING LN (MISCELLANEOUS) ×1 IMPLANT
SPONGE GAUZE 4X4 12PLY (GAUZE/BANDAGES/DRESSINGS) ×2 IMPLANT
SUT ETHILON 4 0 PS 2 18 (SUTURE) ×2 IMPLANT
SYR 5ML LL (SYRINGE) IMPLANT
TOWEL OR 17X24 6PK STRL BLUE (TOWEL DISPOSABLE) ×2 IMPLANT
TOWEL OR NON WOVEN STRL DISP B (DISPOSABLE) ×2 IMPLANT
WATER STERILE IRR 1000ML POUR (IV SOLUTION) ×2 IMPLANT

## 2012-05-02 NOTE — Anesthesia Procedure Notes (Signed)
Procedure Name: LMA Insertion Date/Time: 05/02/2012 10:09 AM Performed by: Meyer Russel Pre-anesthesia Checklist: Patient identified, Emergency Drugs available, Suction available and Patient being monitored Patient Re-evaluated:Patient Re-evaluated prior to inductionOxygen Delivery Method: Circle System Utilized Preoxygenation: Pre-oxygenation with 100% oxygen Intubation Type: IV induction Ventilation: Mask ventilation without difficulty LMA: LMA inserted LMA Size: 4.0 Number of attempts: 1 Airway Equipment and Method: bite block Placement Confirmation: positive ETCO2 and breath sounds checked- equal and bilateral Tube secured with: Tape Dental Injury: Teeth and Oropharynx as per pre-operative assessment

## 2012-05-02 NOTE — H&P (Signed)
PREOPERATIVE H&P  Chief Complaint: r knee pain  HPI: Andrea Bowman is a 52 y.o. female who presents for evaluation of r knee pain . It has been present for greater than 6 months and has been worsening. She has failed conservative measures. Pain is rated as moderate.  Past Medical History  Diagnosis Date  . PVC (premature ventricular contraction)     takes Atenolol   . Anxiety     panic attacks  . Depression   . 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 Osteoarthritis of knee     right  . 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 Perimenopausal   . Dental infection     will finish antibiotic 05/01/2012  . Complication of anesthesia     hard to wake up post-op  . Dental bridge present     upper   Past Surgical History  Procedure Date  . Cholecystectomy 2007   History   Social History  . Marital Status: Married    Spouse Name: N/A    Number of Children: N/A  . Years of Education: N/A   Social History Main Topics  . Smoking status: Current Every Day Smoker -- 30 years    Types: Cigarettes  . Smokeless tobacco: Never Used     Comment: 3 cig./day and several e-cigarettes/day  . Alcohol Use: Yes     Comment: seldom  . Drug Use: No  . Sexually Active: Yes -- Female partner(s)   Other Topics Concern  . None   Social History Narrative  . None   Family History  Problem Relation Age of Onset  . Cancer Mother     Breast  . Hypertension Father   . Heart failure Father     CHF  . Depression Sister     previous history of  . Anxiety disorder Sister     Previous history of  . Depression Brother   . Anxiety disorder Brother   . Ulcers Brother     PUD  . Cancer Maternal Aunt     X several w/ breast cancer, various ages  . Cancer Paternal Uncle     X several w/ prostate cancer  . Cancer Maternal Grandmother    Allergies  Allergen Reactions  . Prednisone Other (See Comments)    REACTION: REDNESS OF FACE   Prior to  Admission medications   Medication Sig Start Date End Date Taking? Authorizing Provider  amoxicillin (AMOXIL) 250 MG capsule Take 250 mg by mouth 3 (three) times daily.   Yes Historical Provider, MD  atenolol (TENORMIN) 25 MG tablet Take 50 mg by mouth daily.    Yes Historical Provider, MD  clonazePAM (KLONOPIN) 0.5 MG tablet Take 1 tablet (0.5 mg total) by mouth 3 (three) times daily as needed for anxiety. 02/06/12  Yes Bradd Canary, MD  ibuprofen (ADVIL,MOTRIN) 200 MG tablet Take 200 mg by mouth every 6 (six) hours as needed.   Yes Historical Provider, MD  sertraline (ZOLOFT) 50 MG tablet Take 1 tablet (50 mg total) by mouth daily. 02/06/12  Yes Bradd Canary, MD  phentermine 37.5 MG capsule Take 1 capsule (37.5 mg total) by mouth every morning. 03/24/12   Bradd Canary, MD     Positive ROS: none  All other systems have been reviewed and were otherwise negative with the exception of those mentioned in the HPI and as above.  Physical Exam: Filed Vitals:   05/02/12 0912  BP: 123/86  Pulse: 63  Temp: 98.1 F (36.7 C)  Resp: 18    General: Alert, no acute distress Cardiovascular: No pedal edema Respiratory: No cyanosis, no use of accessory musculature GI: No organomegaly, abdomen is soft and non-tender Skin: No lesions in the area of chief complaint Neurologic: Sensation intact distally Psychiatric: Patient is competent for consent with normal mood and affect Lymphatic: No axillary or cervical lymphadenopathy  MUSCULOSKELETAL: r knee: painful ROM + compression//+ inhibition  XRAY: lat patellar tilt and narrowing  Min wt bearing narrowing Assessment/Plan: CHONDROMALACIA PATELLA WITH A LATERAL PATELLA TILT RIGHT SIDE Plan for Procedure(s): KNEE ARTHROSCOPY WITH LATERAL RELEASE  The risks benefits and alternatives were discussed with the patient including but not limited to the risks of nonoperative treatment, versus surgical intervention including infection, bleeding, nerve  injury, malunion, nonunion, hardware prominence, hardware failure, need for hardware removal, blood clots, cardiopulmonary complications, morbidity, mortality, among others, and they were willing to proceed.  Predicted outcome is good, although there will be at least a six to nine month expected recovery.  Kamrin Spath L, MD 05/02/2012 9:59 AM

## 2012-05-02 NOTE — Brief Op Note (Signed)
05/02/2012  3:46 PM  PATIENT:  Andrea Bowman  52 y.o. female  PRE-OPERATIVE DIAGNOSIS:  CHONDROMALACIA PATELLA WITH A LATERAL PATELLA TILT RIGHT SIDE  POST-OPERATIVE DIAGNOSIS:  chondromalacia patella with a lateral patella tilt right  PROCEDURE:  Procedure(s) (LRB) with comments: KNEE ARTHROSCOPY WITH LATERAL RELEASE (Right) - chondroplasty with lateral release  SURGEON:  Surgeon(s) and Role:    * Harvie Junior, MD - Primary  PHYSICIAN ASSISTANT:   ASSISTANTS: bethune   ANESTHESIA:   general  EBL:  Total I/O In: 2802 [P.O.:452; I.V.:2350] Out: -   BLOOD ADMINISTERED:none  DRAINS: none   LOCAL MEDICATIONS USED:  MARCAINE     SPECIMEN:  No Specimen  DISPOSITION OF SPECIMEN:  N/A  COUNTS:  YES  TOURNIQUET:  * No tourniquets in log *  DICTATION: .Other Dictation: Dictation Number [id number]  PLAN OF CARE: Discharge to home after PACU  PATIENT DISPOSITION:  PACU - hemodynamically stable.   Delay start of Pharmacological VTE agent (>24hrs) due to surgical blood loss or risk of bleeding: no

## 2012-05-02 NOTE — Transfer of Care (Signed)
Immediate Anesthesia Transfer of Care Note  Patient: Andrea Bowman  Procedure(s) Performed: Procedure(s) (LRB) with comments: KNEE ARTHROSCOPY WITH LATERAL RELEASE (Right) - chondroplasty with lateral release  Patient Location: PACU  Anesthesia Type:General  Level of Consciousness: awake, alert  and oriented  Airway & Oxygen Therapy: Patient Spontanous Breathing and Patient connected to face mask oxygen  Post-op Assessment: Report given to PACU RN, Post -op Vital signs reviewed and stable and Patient moving all extremities  Post vital signs: Reviewed and stable  Complications: No apparent anesthesia complications

## 2012-05-02 NOTE — Anesthesia Preprocedure Evaluation (Addendum)
Anesthesia Evaluation  Patient identified by MRN, date of birth, ID band Patient awake    Reviewed: Allergy & Precautions, H&P , NPO status , Patient's Chart, lab work & pertinent test results, reviewed documented beta blocker date and time   Airway Mallampati: I TM Distance: >3 FB Neck ROM: Full    Dental  (+) Teeth Intact   Pulmonary  breath sounds clear to auscultation        Cardiovascular + dysrhythmias Rhythm:Regular Rate:Normal     Neuro/Psych    GI/Hepatic   Endo/Other    Renal/GU      Musculoskeletal   Abdominal   Peds  Hematology   Anesthesia Other Findings Hx of idiopathic PVC's treated with Atenolol.  Well controlled by her hx.  Reproductive/Obstetrics                           Anesthesia Physical Anesthesia Plan  ASA: II  Anesthesia Plan: General   Post-op Pain Management:    Induction: Intravenous  Airway Management Planned: LMA  Additional Equipment:   Intra-op Plan:   Post-operative Plan:   Informed Consent: I have reviewed the patients History and Physical, chart, labs and discussed the procedure including the risks, benefits and alternatives for the proposed anesthesia with the patient or authorized representative who has indicated his/her understanding and acceptance.   Dental advisory given  Plan Discussed with: CRNA, Anesthesiologist and Surgeon  Anesthesia Plan Comments:         Anesthesia Quick Evaluation

## 2012-05-02 NOTE — Anesthesia Postprocedure Evaluation (Signed)
  Anesthesia Post-op Note  Patient: Andrea Bowman  Procedure(s) Performed: Procedure(s) (LRB) with comments: KNEE ARTHROSCOPY WITH LATERAL RELEASE (Right) - chondroplasty with lateral release  Patient Location: PACU  Anesthesia Type:General  Level of Consciousness: awake, alert  and oriented  Airway and Oxygen Therapy: Patient Spontanous Breathing and Patient connected to face mask oxygen  Post-op Pain: none  Post-op Assessment: Post-op Vital signs reviewed  Post-op Vital Signs: Reviewed  Complications: No apparent anesthesia complications

## 2012-05-02 NOTE — Progress Notes (Signed)
Pt was placed in high fowlers, given drink and crackers. States chest discomfort much improved. Talking  With ease and no complaints.

## 2012-05-05 ENCOUNTER — Encounter (HOSPITAL_BASED_OUTPATIENT_CLINIC_OR_DEPARTMENT_OTHER): Payer: Self-pay | Admitting: Orthopedic Surgery

## 2012-05-05 NOTE — Op Note (Signed)
Andrea Bowman, Andrea Bowman                ACCOUNT NO.:  [id number]  MEDICAL RECORD NO.:  [id number]  LOCATION:                               FACILITY:  [facility]  PHYSICIAN:  Harvie Junior, M.D.   DATE OF BIRTH:  27-Feb-1961  DATE OF PROCEDURE:  05/02/2012 DATE OF DISCHARGE:  05/02/2012                              OPERATIVE REPORT   PREOPERATIVE DIAGNOSES:  Patellofemoral pain in particular in the lateral patellar facet and lateral femoral condyle, tight lateral retinaculum, and questionable chondromalacia of the lateral femoral condyle.  POSTOPERATIVE DIAGNOSES:  Patellofemoral pain in particular in the lateral patellar facet and lateral femoral condyle, tight lateral retinaculum, and questionable chondromalacia of the lateral femoral condyle.  PROCEDURE: 1. Operative knee arthroscopy with debridement of chondromalacia of     the lateral femoral condyle, lateral patellar facet, and the     anterior-lateral femoral condyle. 2. Lateral retinacular release.  SURGEON:  Harvie Junior, M.D.  ASSISTANT:  Marshia Ly, P.A.  ANESTHESIA:  General.  BRIEF HISTORY:  Andrea Bowman is a 52 year old female with a history of having had severe right knee problems.  She had been treated and evaluated with multiple doctors with MRI showing that she had chondromalacia of the lateral patellar facet, lateral anterior femoral condyle, and then distal lateral femoral condyle.  Menisci looked okay and she was not unstable.  She had opinion is varying from leaving her alone to patellofemoral replacement which she came for our evaluation. At that point, I felt that patellofemoral evaluation was aggressive.  I certainly felt that she needed a knee scope given that she had greater than 6 months of pain and had not had a knee scope, so after discussion she did wish to proceed with this and she was brought to the operating room for this evaluation understanding that she certainly could have some greater issues  than what was anticipated based on her MRI.  She was brought to the operating room for this procedure.  PROCEDURE:  The patient was brought to the operating room.  After adequate anesthesia has been obtained with general anesthesia, patient was placed supine on the operating table.  The right leg was prepped and draped in usual sterile fashion.  Following this, attention was turned to the right knee after routine prep and drape.  Routine arthroscopic examination of the knee revealed that there was no medial meniscal tear. The medial femoral condyle had some grade 2 chondromalacia which was debrided.  Attention was turned to the lateral compartment. Unfortunately, there was some deep grade 3, quite grade 4 but certainly deep grade 3 chondromalacia of the lateral femoral condyle in the weightbearing area and there was some chondromalacia of lateral tibial plateau.  Lateral meniscus looked good.  ACL looked good.  Attention was turned back up to the patellofemoral joint.  There was dramatic lateral tracking of the patella.  There was lateral angulation of the patella. There was severe chondromalacia of the lateral patellar facet and severe chondromalacia of the anterior-lateral femoral condyle.  This was debrided significantly.  Then, attention was turned towards the lateral retinaculum.  Lateral retinaculum was released with the arthroscopic Bovie from 3 fingerbreadths  proximal to the patella down to the joint line, and once this was done, it was checked with a nerve probe the patellar tracking dramatically improved with this and the patellar tilt was released.  At this point, the knee was copiously and thoroughly lavaged and suctioned dry.  The arthroscopic portals were closed with bandage.  Sterile compressive dressing was applied, and prior to the placement of the bandage, 20 mL of 1% Marcaine was instilled in the knee for postoperative anesthesia.     Harvie Junior,  M.D.     Ranae Plumber  D:  05/02/2012  T:  05/03/2012  Job:  [id number]

## 2012-06-15 ENCOUNTER — Encounter (HOSPITAL_COMMUNITY): Payer: Self-pay | Admitting: Emergency Medicine

## 2012-06-15 ENCOUNTER — Emergency Department (HOSPITAL_COMMUNITY): Payer: Managed Care, Other (non HMO)

## 2012-06-15 ENCOUNTER — Emergency Department (HOSPITAL_COMMUNITY)
Admission: EM | Admit: 2012-06-15 | Discharge: 2012-06-16 | Discharge status: Against Medical Advice | Payer: Managed Care, Other (non HMO) | Attending: Emergency Medicine | Admitting: Emergency Medicine

## 2012-06-15 DIAGNOSIS — Z8679 Personal history of other diseases of the circulatory system: Secondary | ICD-10-CM | POA: Insufficient documentation

## 2012-06-15 DIAGNOSIS — Z8619 Personal history of other infectious and parasitic diseases: Secondary | ICD-10-CM | POA: Insufficient documentation

## 2012-06-15 DIAGNOSIS — Z79899 Other long term (current) drug therapy: Secondary | ICD-10-CM | POA: Insufficient documentation

## 2012-06-15 DIAGNOSIS — F411 Generalized anxiety disorder: Secondary | ICD-10-CM | POA: Insufficient documentation

## 2012-06-15 DIAGNOSIS — Z8739 Personal history of other diseases of the musculoskeletal system and connective tissue: Secondary | ICD-10-CM | POA: Insufficient documentation

## 2012-06-15 DIAGNOSIS — F17200 Nicotine dependence, unspecified, uncomplicated: Secondary | ICD-10-CM | POA: Insufficient documentation

## 2012-06-15 DIAGNOSIS — F3289 Other specified depressive episodes: Secondary | ICD-10-CM | POA: Insufficient documentation

## 2012-06-15 DIAGNOSIS — F329 Major depressive disorder, single episode, unspecified: Secondary | ICD-10-CM | POA: Insufficient documentation

## 2012-06-15 DIAGNOSIS — R072 Precordial pain: Secondary | ICD-10-CM | POA: Insufficient documentation

## 2012-06-15 DIAGNOSIS — R61 Generalized hyperhidrosis: Secondary | ICD-10-CM | POA: Insufficient documentation

## 2012-06-15 DIAGNOSIS — Z8719 Personal history of other diseases of the digestive system: Secondary | ICD-10-CM | POA: Insufficient documentation

## 2012-06-15 DIAGNOSIS — R110 Nausea: Secondary | ICD-10-CM | POA: Insufficient documentation

## 2012-06-15 LAB — CBC
MCV: 95.2 fL (ref 78.0–100.0)
Platelets: 246 10*3/uL (ref 150–400)
RBC: 4.16 MIL/uL (ref 3.87–5.11)
RDW: 13.1 % (ref 11.5–15.5)
WBC: 11.1 10*3/uL — ABNORMAL HIGH (ref 4.0–10.5)

## 2012-06-15 LAB — BASIC METABOLIC PANEL
CO2: 29 mEq/L (ref 19–32)
Chloride: 105 mEq/L (ref 96–112)
Creatinine, Ser: 0.87 mg/dL (ref 0.50–1.10)
GFR calc Af Amer: 87 mL/min — ABNORMAL LOW (ref >90)
Potassium: 4.6 mEq/L (ref 3.5–5.1)
Sodium: 140 mEq/L (ref 135–145)

## 2012-06-15 LAB — D-DIMER, QUANTITATIVE: D-Dimer, Quant: <0.27 ug/mL-FEU (ref 0.00–0.48)

## 2012-06-15 LAB — TROPONIN I: Troponin I: <0.3 ng/mL (ref <0.30)

## 2012-06-15 NOTE — ED Notes (Signed)
Pt reports she was just sitting at home when the CP started, sts them as sharp shooting pains in center of chest that shot into right neck along with pressure. Pt called ems because the pain was so intense. Reports she now just has slight CP.

## 2012-06-15 NOTE — ED Notes (Signed)
Per EMS - pt c/o central chest pressure that radiates to bilateral sides of neck started at 6pm tonight. EMS started a 20G in left had and administered 325 of ASA and 3 Nitro, pt is now CP free. Pt placed on 2 L/min Bruni. HR 60 nsr with PVCs BP 116/70 96% on 2Liters oxygen.

## 2012-06-15 NOTE — ED Provider Notes (Addendum)
History     CSN: [id number]  Arrival date & time 06/15/12  2047   First MD Initiated Contact with Patient 06/15/12 2056      Chief Complaint  Patient presents with  . Chest Pain    (Consider location/radiation/quality/duration/timing/severity/associated sxs/prior treatment) Patient is a 52 y.o. female presenting with chest pain. The history is provided by the patient.  Chest Pain Pain location:  Substernal area Pain quality: pressure, sharp and tightness   Pain radiates to:  Upper back, R jaw and L jaw Pain radiates to the back: yes   Pain severity:  Severe Onset quality:  Sudden Duration: Patient had 2 episodes. The first episode lasted approximately 5 minutes and resolved on its own and the second episode approximately an hour later lasted for about 45 minutes until she was given nitroglycerin by EMS. Timing:  Intermittent Progression:  Resolved Chronicity:  New Context: not eating   Context comment:  It started at rest and was not worsened by breathing Relieved by:  Aspirin and nitroglycerin Worsened by:  Nothing tried Associated symptoms: diaphoresis and nausea   Associated symptoms: no abdominal pain, no cough, no headache, no heartburn and no shortness of breath   Risk factors: smoking and surgery   Risk factors: no coronary artery disease, no diabetes mellitus, no high cholesterol, no hypertension, no immobilization and no prior DVT/PE     Past Medical History  Diagnosis Date  . PVC (premature ventricular contraction)     takes Atenolol   . Anxiety     panic attacks  . Depression   . 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 Osteoarthritis of knee     right  . 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 Perimenopausal   . Dental infection     will finish antibiotic 05/01/2012  . Complication of anesthesia     hard to wake up post-op  . Dental bridge present     upper    Past Surgical History  Procedure Laterality Date   . Cholecystectomy  2007  . Knee arthroscopy with lateral release  05/02/2012    Procedure: KNEE ARTHROSCOPY WITH LATERAL RELEASE;  Surgeon: Harvie Junior, MD;  Location: [facility];  Service: Orthopedics;  Laterality: Right;  chondroplasty with lateral release    Family History  Problem Relation Age of Onset  . Cancer Mother     Breast  . Hypertension Father   . Heart failure Father     CHF  . Depression Sister     previous history of  . Anxiety disorder Sister     Previous history of  . Depression Brother   . Anxiety disorder Brother   . Ulcers Brother     PUD  . Cancer Maternal Aunt     X several w/ breast cancer, various ages  . Cancer Paternal Uncle     X several w/ prostate cancer  . Cancer Maternal Grandmother     History  Substance Use Topics  . Smoking status: Current Every Day Smoker -- 30 years    Types: Cigarettes  . Smokeless tobacco: Never Used     Comment: 3 cig./day and several e-cigarettes/day  . Alcohol Use: Yes     Comment: seldom    OB History   Grav Para Term Preterm Abortions TAB SAB Ect Mult Living                  Review of Systems  Constitutional: Positive  for diaphoresis.  Respiratory: Negative for cough and shortness of breath.   Cardiovascular: Positive for chest pain.  Gastrointestinal: Positive for nausea. Negative for heartburn and abdominal pain.  Neurological: Negative for headaches.  All other systems reviewed and are negative.    Allergies  Prednisone  Home Medications   Current Outpatient Rx  Name  Route  Sig  Dispense  Refill  . amoxicillin (AMOXIL) 250 MG capsule   Oral   Take 250 mg by mouth 3 (three) times daily.         Marland Kitchen atenolol (TENORMIN) 25 MG tablet   Oral   Take 50 mg by mouth daily.          . clonazePAM (KLONOPIN) 0.5 MG tablet   Oral   Take 1 tablet (0.5 mg total) by mouth 3 (three) times daily as needed for anxiety.   60 tablet   2   . ibuprofen (ADVIL,MOTRIN) 200 MG  tablet   Oral   Take 200 mg by mouth every 6 (six) hours as needed.         Marland Kitchen oxyCODONE-acetaminophen (PERCOCET/ROXICET) 5-325 MG per tablet   Oral   Take 1-2 tablets by mouth every 6 (six) hours as needed for pain.   40 tablet   0   . phentermine 37.5 MG capsule   Oral   Take 1 capsule (37.5 mg total) by mouth every morning.   30 capsule   2   . sertraline (ZOLOFT) 50 MG tablet   Oral   Take 1 tablet (50 mg total) by mouth daily.   30 tablet   5     There were no vitals taken for this visit.  Physical Exam  Nursing note and vitals reviewed. Constitutional: She is oriented to person, place, and time. She appears well-developed and well-nourished. No distress.  HENT:  Head: Normocephalic and atraumatic.  Mouth/Throat: Oropharynx is clear and moist.  Eyes: Conjunctivae and EOM are normal. Pupils are equal, round, and reactive to light.  Neck: Normal range of motion. Neck supple.  Cardiovascular: Normal rate, regular rhythm and intact distal pulses.   No murmur heard. Pulmonary/Chest: Effort normal and breath sounds normal. No respiratory distress. She has no wheezes. She has no rales.  Abdominal: Soft. She exhibits no distension. There is no tenderness. There is no rebound and no guarding.  Musculoskeletal: Normal range of motion. She exhibits no edema and no tenderness.  Neurological: She is alert and oriented to person, place, and time.  Skin: Skin is warm and dry. No rash noted. No erythema.  Psychiatric: She has a normal mood and affect. Her behavior is normal.    ED Course  Procedures (including critical care time)  Labs Reviewed  CBC - Abnormal; Notable for the following:    WBC 11.1 (*)    All other components within normal limits  BASIC METABOLIC PANEL - Abnormal; Notable for the following:    Glucose, Bld 121 (*)    GFR calc non Af Amer 75 (*)    GFR calc Af Amer 87 (*)    All other components within normal limits  TROPONIN I  D-DIMER, QUANTITATIVE    Dg Chest 2 View  06/15/2012  *RADIOLOGY REPORT*  Clinical Data: The chest pain  CHEST - 2 VIEW  Comparison: 12/20/2009  Findings: Mild right hemidiaphragm elevation.  Lungs predominately clear.  Cardiomediastinal contours within normal range.  No pleural effusion or pneumothorax. Unchanged mid thoracic vertebral body wedging.  No acute osseous finding.  Right upper quadrant surgical clips.  IMPRESSION: No radiographic evidence of acute cardiopulmonary process.   Original Report Authenticated By: Jearld Lesch, M.D.      Date: 06/15/2012  Rate: 88  Rhythm: sinus arrhythmia and premature ventricular contractions (PVC)  QRS Axis: normal  Intervals: normal  ST/T Wave abnormalities: normal  Conduction Disutrbances:none  Narrative Interpretation:   Old EKG Reviewed: none available    1. Chest pain at rest       MDM   Pt with symptoms concerning for ACS.  TIMI 2 for aspirin and 2 episodes in 24 hours. Associated symptoms include diaphoresis and nausea.  pt recently had arthroscopic surgery on her knee in mid January. She denies any unilateral leg pain or swelling. ASA and NTG given by EMS with complete resolution of pain. Patient does not have a history of hypertension, diabetes or hyperlipidemia but is a smoker.  Patient's symptoms are most concerning for ACS. Lower suspicion for PE at this time his given history. EKG, CXR, CBC, BMP, CE, Coags pending.  11:00 PM All labs are within normal limits. EKG and chest x-ray are within normal limits. Findings discussed with the patient and given her concerning story discussed with her admission overnight for further evaluation and possible catheterization. Patient refused and states that she will followup with Dr. Jens Som as an outpatient. She understands her risk and still desires to leave [facility]       Gwyneth Sprout, MD 06/15/12 1610  Gwyneth Sprout, MD 06/15/12 2303

## 2012-06-16 ENCOUNTER — Telehealth: Payer: Self-pay | Admitting: Cardiology

## 2012-06-16 ENCOUNTER — Ambulatory Visit (INDEPENDENT_AMBULATORY_CARE_PROVIDER_SITE_OTHER): Payer: Managed Care, Other (non HMO) | Admitting: Nurse Practitioner

## 2012-06-16 ENCOUNTER — Encounter: Payer: Self-pay | Admitting: Nurse Practitioner

## 2012-06-16 ENCOUNTER — Ambulatory Visit: Payer: Managed Care, Other (non HMO) | Admitting: Nurse Practitioner

## 2012-06-16 VITALS — BP 110/78 | HR 62 | Ht 67.0 in | Wt 205.0 lb

## 2012-06-16 DIAGNOSIS — R079 Chest pain, unspecified: Secondary | ICD-10-CM

## 2012-06-16 DIAGNOSIS — Z79899 Other long term (current) drug therapy: Secondary | ICD-10-CM

## 2012-06-16 LAB — BASIC METABOLIC PANEL
BUN: 15 mg/dL (ref 6–23)
CO2: 28 mEq/L (ref 19–32)
Calcium: 9.3 mg/dL (ref 8.4–10.5)
Chloride: 106 mEq/L (ref 96–112)
Creatinine, Ser: 0.8 mg/dL (ref 0.4–1.2)
GFR: 83.11 mL/min (ref >60.00)
Glucose, Bld: 91 mg/dL (ref 70–99)
Potassium: 4.1 mEq/L (ref 3.5–5.1)
Sodium: 140 mEq/L (ref 135–145)

## 2012-06-16 LAB — CBC WITH DIFFERENTIAL/PLATELET
Basophils Absolute: 0 10*3/uL (ref 0.0–0.1)
Basophils Relative: 0.2 % (ref 0.0–3.0)
Eosinophils Absolute: 0.2 10*3/uL (ref 0.0–0.7)
Eosinophils Relative: 1.6 % (ref 0.0–5.0)
HCT: 43.1 % (ref 36.0–46.0)
Hemoglobin: 14.6 g/dL (ref 12.0–15.0)
Lymphocytes Relative: 22.7 % (ref 12.0–46.0)
Lymphs Abs: 2.8 10*3/uL (ref 0.7–4.0)
MCHC: 34 g/dL (ref 30.0–36.0)
MCV: 96.5 fl (ref 78.0–100.0)
Monocytes Absolute: 0.9 10*3/uL (ref 0.1–1.0)
Monocytes Relative: 7.6 % (ref 3.0–12.0)
Neutro Abs: 8.4 10*3/uL — ABNORMAL HIGH (ref 1.4–7.7)
Neutrophils Relative %: 67.9 % (ref 43.0–77.0)
Platelets: 267 10*3/uL (ref 150.0–400.0)
RBC: 4.47 Mil/uL (ref 3.87–5.11)
RDW: 13.4 % (ref 11.5–14.6)
WBC: 12.4 10*3/uL — ABNORMAL HIGH (ref 4.5–10.5)

## 2012-06-16 LAB — LIPID PANEL
Cholesterol: 188 mg/dL (ref 0–200)
HDL: 54.3 mg/dL (ref >39.00)
LDL Cholesterol: 107 mg/dL — ABNORMAL HIGH (ref 0–99)
Total CHOL/HDL Ratio: 3
Triglycerides: 135 mg/dL (ref 0.0–149.0)
VLDL: 27 mg/dL (ref 0.0–40.0)

## 2012-06-16 LAB — HEPATIC FUNCTION PANEL
ALT: 18 U/L (ref 0–35)
AST: 17 U/L (ref 0–37)
Albumin: 3.7 g/dL (ref 3.5–5.2)
Alkaline Phosphatase: 52 U/L (ref 39–117)
Bilirubin, Direct: 0.1 mg/dL (ref 0.0–0.3)
Total Bilirubin: 0.6 mg/dL (ref 0.3–1.2)
Total Protein: 6.8 g/dL (ref 6.0–8.3)

## 2012-06-16 LAB — APTT: aPTT: 25.2 s (ref 21.7–28.8)

## 2012-06-16 LAB — PROTIME-INR
INR: 1 ratio (ref 0.8–1.0)
Prothrombin Time: 10.5 s (ref 10.2–12.4)

## 2012-06-16 LAB — TSH: TSH: 1.49 u[IU]/mL (ref 0.35–5.50)

## 2012-06-16 LAB — TROPONIN I: Troponin I: <0.3 ng/mL (ref <0.30)

## 2012-06-16 LAB — D-DIMER, QUANTITATIVE: D-Dimer, Quant: 0.27 ug/mL-FEU (ref 0.00–0.48)

## 2012-06-16 MED ORDER — NITROGLYCERIN 0.4 MG SL SUBL: 0.4000 mg | SUBLINGUAL_TABLET | SUBLINGUAL | Status: DC | PRN

## 2012-06-16 NOTE — Telephone Encounter (Signed)
Spoke with pt, she developed sharpe pain in the center of her chest that went up her neck into her ear. She went to the ER and everything came back normal. She did not want to be admitted. She is pain free this am and ask to be seen. She will see lori gerhardt np at 11:45am today. She will have her dtr drive her.

## 2012-06-16 NOTE — Telephone Encounter (Signed)
New problem    Would like to be seen today    C/O went to [facility] last night with server sharp pain going up to left side of her neck. Patient was advise to be admit. But refuse .

## 2012-06-16 NOTE — Patient Instructions (Addendum)
We are repeating your labs today - if these come back abnormal, you will be asked to return to the hospital for evaluation/management.  We are setting up a cardiac cath for tomorrow  I have sent in a prescription for NTG to your drug store.  Use your NTG under your tongue for recurrent chest pain. May take one tablet every 5 minutes. If you are still having discomfort after 3 tablets in 15 minutes, call 911.  Smoking cessation is encouraged  You are scheduled for an outpatient cardiac catheterization on Tuesday with Dr. Rory Percy or associates.  Go the [facility] Center at [facility] on Tuesday at 11am.  Call the Heart & Vascular Center at [phone] if you are unable to make your appointment.  The code to get into the parking garage under the building is 9000. Then go to the first floor.  You must have someone available to drive you home. Someone needs to be with you for the first 24 hours after you arrive home. Please wear clothes that are easy to get on and off.  Do not eat or drink after midnight on Monday. You may have water only with your medications on the morning of your procedure.   May sure you take your aspirin on the day of your procedure.       Directions to the Outpatient Cardiac Cath Lab at [facility]:  Please Note:  Park in [place] under the building not the parking deck.  From [employer]: Turn onto [address] Left onto [place] [place] (1st stoplight) Right at the brick entrance to the hospital ([address] circle [address]) Bear to the right and you will see a blue sign "Heart and Vascular Center" Parking garage is a sharp right, to get through the gate put in the code 9000. Once you park, take the elevator to the first floor.  Please do not arrive before 6:30 am.  The building will be dark before that time.  From [employer]: Turn onto [address] Turn left into the brick entrance to the hospital (Main circle drive) [place] to the right and you will  see a blue sign "[facility] Center" Parking garage is a sharp right, to get through the gate put in the code 9000. Once you park, take the elevator to the first floor.  Please do not arrive before 6:30 am.  The building will be dark before that time.   Coronary Angiography Coronary angiography is an X-ray procedure used to look at the arteries in the heart. In this procedure, a dye is injected through a long, hollow tube (catheter). The catheter is about the size of a piece of cooked spaghetti. The catheter injects a dye into an artery in your groin. X-rays are then taken to show if there is a blockage in the arteries of your heart. BEFORE THE PROCEDURE   Let your caregiver know if you have allergies to shellfish or contrast dye. Also let your caregiver know if you have kidney problems or failure.  Do not eat or drink starting from midnight up to the time of the procedure, or as directed.  You may drink enough water to take your medications the morning of the procedure if you were instructed to do so.  You should be at the hospital or outpatient facility where the procedure is to be done 60 minutes prior to the procedure or as directed. PROCEDURE  You may be given an IV medication to help you relax before the procedure.  You will be prepared for the procedure by washing and shaving the area where the catheter will be inserted. This is usually done in the groin but may be done in the fold of your arm by your elbow.  A medicine will be given to numb your groin where the catheter will be inserted.  A specially trained doctor will insert the catheter into an artery in your groin. The catheter is guided by using a special type of X-ray (fluoroscopy) to the blood vessel being examined.  A special dye is then injected into the catheter and X-rays are taken. The dye helps to show where any narrowing or blockages are located in the heart arteries. AFTER THE PROCEDURE   After the procedure  you will be kept in bed lying flat for several hours. You will be instructed to not bend or cross your legs.  The groin insertion site will be watched and checked frequently.  The pulse in your feet will be checked frequently.  Additional blood tests, X-rays and an EKG may be done.  You may stay in the hospital overnight for observation. SEEK IMMEDIATE MEDICAL CARE IF:   You develop chest pain, shortness of breath, feel faint, or pass out.  There is bleeding, swelling, or drainage from the catheter insertion site.  You develop pain, discoloration, coldness, or severe bruising in the leg or area where the catheter was inserted.  You have a fever. Document Released: 09/30/2002 Document Revised: 06/18/2011 Document Reviewed: 11/19/2007 [employer] [facility] Patient Information 2013 [place], [place].

## 2012-06-16 NOTE — Progress Notes (Signed)
 Andrea Bowman Date of Birth: 03/25/1961 Medical Record #[id number]  History of Present Illness: Andrea Bowman is seen back today for a work in visit. She is seen for Dr. Crenshaw. She has known PVC's and ongoing tobacco abuse. No early CAD in her family reported. Lipids are borderline. She was last seen here 2 years ago. Had had prior GXT back in 2011 for her palpitations. Normal stress echo in October of 2011. She has not been seen since that time.  She comes in today. Was in the ER last night. Had chest pain. First spell around 5 pm that lasted about 5 minutes. Resolved on its own. Second spell about 2 hours later. Very intense. Had radiation into the left side of her jaw and her left ear and associated with nausea and some diaphoresis. Called EMS. Treated with aspirin. Relief was obtained with NTG. Admission was recommended but she refused. Went home and wanted to be seen here instead. Troponin and D dimer was negative. She has had knee surgery back in January. Currently without any chest pain. Has had issues with ongoing fatigue. Says this is totally different from her PVC's. She does not actively exercise. No coughing up blood. Stable dyspnea reported.   Current Outpatient Prescriptions on File Prior to Visit  Medication Sig Dispense Refill  . atenolol (TENORMIN) 25 MG tablet Take 25 mg by mouth 2 (two) times daily.       . clonazePAM (KLONOPIN) 0.5 MG tablet Take 1 tablet (0.5 mg total) by mouth 3 (three) times daily as needed for anxiety.  60 tablet  2  . ibuprofen (ADVIL,MOTRIN) 200 MG tablet Take 600 mg by mouth every 6 (six) hours as needed for pain.      . oxyCODONE-acetaminophen (PERCOCET/ROXICET) 5-325 MG per tablet Take 1-2 tablets by mouth every 6 (six) hours as needed for pain.  40 tablet  0  . sertraline (ZOLOFT) 50 MG tablet Take 1 tablet (50 mg total) by mouth daily.  30 tablet  5   No current facility-administered medications on file prior to visit.    Allergies  Allergen  Reactions  . Prednisone Other (See Comments)    REACTION: REDNESS OF FACE    Past Medical History  Diagnosis Date  . PVC (premature ventricular contraction)     takes Atenolol   . Anxiety     panic attacks  . Depression   . 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 Osteoarthritis of knee     right  . 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 Perimenopausal   . Dental infection     will finish antibiotic 05/01/2012  . Complication of anesthesia     hard to wake up post-op  . Dental bridge present     upper    Past Surgical History  Procedure Laterality Date  . Cholecystectomy  2007  . Knee arthroscopy with lateral release  05/02/2012    Procedure: KNEE ARTHROSCOPY WITH LATERAL RELEASE;  Surgeon: John L Graves, MD;  Location: [facility];  Service: Orthopedics;  Laterality: Right;  chondroplasty with lateral release    History  Smoking status  . Current Every Day Smoker -- 30 years  . Types: Cigarettes  Smokeless tobacco  . Never Used    Comment: 3 cig./day and several e-cigarettes/day    History  Alcohol Use  . Yes    Comment: seldom    Family History  Problem Relation Age of Onset  .   Cancer Mother     Breast  . Hypertension Father   . Heart failure Father     CHF  . Depression Sister     previous history of  . Anxiety disorder Sister     Previous history of  . Depression Brother   . Anxiety disorder Brother   . Ulcers Brother     PUD  . Cancer Maternal Aunt     X several w/ breast cancer, various ages  . Cancer Paternal Uncle     X several w/ prostate cancer  . Cancer Maternal Grandmother     Review of Systems: The review of systems is positive for fatigue.  All other systems were reviewed and are negative.  Physical Exam: BP 110/78  Pulse 62  Ht 5' 7" (1.702 m)  Wt 205 lb (92.987 kg)  BMI 32.1 kg/m2 Patient is alert and in no acute distress. She looks older than her stated age. Skin is  warm and dry. Color is normal.  HEENT is unremarkable. Normocephalic/atraumatic. PERRL. Sclera are nonicteric. Neck is supple. No masses. No JVD. Lungs are coarse. Cardiac exam shows a regular rate and rhythm. Abdomen is soft. Extremities are without edema. Gait and ROM are intact. No gross neurologic deficits noted.  LABORATORY DATA:   EKG today shows sinus with a PVC. No acute changes.   Lab Results  Component Value Date   WBC 11.1* 06/15/2012   HGB 13.8 06/15/2012   HCT 39.6 06/15/2012   PLT 246 06/15/2012   GLUCOSE 121* 06/15/2012   CHOL 201* 01/30/2012   TRIG 118.0 01/30/2012   HDL 45.10 01/30/2012   LDLDIRECT 144.2 01/30/2012   LDLCALC 108* 09/17/2011   ALT 21 01/30/2012   AST 18 01/30/2012   NA 140 06/15/2012   K 4.6 06/15/2012   CL 105 06/15/2012   CREATININE 0.87 06/15/2012   BUN 20 06/15/2012   CO2 29 06/15/2012   TSH 1.49 01/30/2012    Assessment / Plan: 1. Chest pain with radiation to left neck/jaw/ear - relieved with aspirin/NTG - concerning for angina. Refused admission last night. Past negative stress echo. I have arranged for cardiac cath tomorrow. The procedure, risks and benefits including allergy, bleeding, bruising, stroke, MI, irregular rhythms and even death were discussed. She is willing to proceed. NTG was called to her drug store. I am repeating her Troponin and d dimer and told her that if these were positive then she would be asked to return to the hospital.   2. HLD - checking today  3. Tobacco abuse - trying to cut back and using E cig - has smoked for at least 30 years with 1 pk/per day previously.   Patient is agreeable to this plan and will call if any problems develop in the interim.   

## 2012-06-17 ENCOUNTER — Inpatient Hospital Stay (HOSPITAL_BASED_OUTPATIENT_CLINIC_OR_DEPARTMENT_OTHER)
Admission: RE | Admit: 2012-06-17 | Discharge: 2012-06-17 | Disposition: A | Discharge status: Home / Self Care | Payer: Managed Care, Other (non HMO) | Source: Ambulatory Visit | Attending: Cardiovascular Disease | Admitting: Cardiovascular Disease

## 2012-06-17 ENCOUNTER — Encounter (HOSPITAL_BASED_OUTPATIENT_CLINIC_OR_DEPARTMENT_OTHER)
Admission: RE | Disposition: A | Discharge status: Home / Self Care | Payer: Self-pay | Source: Ambulatory Visit | Attending: Cardiovascular Disease

## 2012-06-17 DIAGNOSIS — R079 Chest pain, unspecified: Secondary | ICD-10-CM

## 2012-06-17 SURGERY — JV LEFT HEART CATHETERIZATION WITH CORONARY ANGIOGRAM

## 2012-06-17 MED ORDER — SODIUM CHLORIDE 0.9 % IV SOLN: INTRAVENOUS | Status: DC

## 2012-06-17 MED ORDER — ASPIRIN 81 MG PO CHEW
324.0000 mg | CHEWABLE_TABLET | ORAL | Status: AC
  Administered 2012-06-17: 243 mg via ORAL

## 2012-06-17 MED ORDER — SODIUM CHLORIDE 0.9 % IJ SOLN: 3.0000 mL | Freq: Two times a day (BID) | INTRAMUSCULAR | Status: DC

## 2012-06-17 MED ORDER — DIAZEPAM 5 MG PO TABS
10.0000 mg | ORAL_TABLET | ORAL | Status: AC
  Administered 2012-06-17: 10 mg via ORAL

## 2012-06-17 MED ORDER — ACETAMINOPHEN 325 MG PO TABS: 650.0000 mg | ORAL_TABLET | ORAL | Status: DC | PRN

## 2012-06-17 MED ORDER — SODIUM CHLORIDE 0.9 % IV SOLN: 250.0000 mL | INTRAVENOUS | Status: DC | PRN

## 2012-06-17 MED ORDER — SODIUM CHLORIDE 0.9 % IJ SOLN: 3.0000 mL | INTRAMUSCULAR | Status: DC | PRN

## 2012-06-17 MED ORDER — ONDANSETRON HCL 4 MG/2ML IJ SOLN: 4.0000 mg | Freq: Four times a day (QID) | INTRAMUSCULAR | Status: DC | PRN

## 2012-06-17 NOTE — OR Nursing (Signed)
+  Allen's test right hand 

## 2012-06-17 NOTE — H&P (View-Only) (Signed)
Andrea Bowman Date of Birth: 05-13-1960 Medical Record #[id number]  History of Present Illness: Andrea Bowman is seen back today for a work in visit. She is seen for Dr. Jens Som. She has known PVC's and ongoing tobacco abuse. No early CAD in her family reported. Lipids are borderline. She was last seen here 2 years ago. Had had prior GXT back in 2011 for her palpitations. Normal stress echo in October of 2011. She has not been seen since that time.  She comes in today. Was in the ER last night. Had chest pain. First spell around 5 pm that lasted about 5 minutes. Resolved on its own. Second spell about 2 hours later. Very intense. Had radiation into the left side of her jaw and her left ear and associated with nausea and some diaphoresis. Called EMS. Treated with aspirin. Relief was obtained with NTG. Admission was recommended but she refused. Went home and wanted to be seen here instead. Troponin and D dimer was negative. She has had knee surgery back in January. Currently without any chest pain. Has had issues with ongoing fatigue. Says this is totally different from her PVC's. She does not actively exercise. No coughing up blood. Stable dyspnea reported.   Current Outpatient Prescriptions on File Prior to Visit  Medication Sig Dispense Refill  . atenolol (TENORMIN) 25 MG tablet Take 25 mg by mouth 2 (two) times daily.       . clonazePAM (KLONOPIN) 0.5 MG tablet Take 1 tablet (0.5 mg total) by mouth 3 (three) times daily as needed for anxiety.  60 tablet  2  . ibuprofen (ADVIL,MOTRIN) 200 MG tablet Take 600 mg by mouth every 6 (six) hours as needed for pain.      Marland Kitchen oxyCODONE-acetaminophen (PERCOCET/ROXICET) 5-325 MG per tablet Take 1-2 tablets by mouth every 6 (six) hours as needed for pain.  40 tablet  0  . sertraline (ZOLOFT) 50 MG tablet Take 1 tablet (50 mg total) by mouth daily.  30 tablet  5   No current facility-administered medications on file prior to visit.    Allergies  Allergen  Reactions  . Prednisone Other (See Comments)    REACTION: REDNESS OF FACE    Past Medical History  Diagnosis Date  . PVC (premature ventricular contraction)     takes Atenolol   . Anxiety     panic attacks  . Depression   . 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 Osteoarthritis of knee     right  . 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 Perimenopausal   . Dental infection     will finish antibiotic 05/01/2012  . Complication of anesthesia     hard to wake up post-op  . Dental bridge present     upper    Past Surgical History  Procedure Laterality Date  . Cholecystectomy  2007  . Knee arthroscopy with lateral release  05/02/2012    Procedure: KNEE ARTHROSCOPY WITH LATERAL RELEASE;  Surgeon: Harvie Junior, MD;  Location: [facility];  Service: Orthopedics;  Laterality: Right;  chondroplasty with lateral release    History  Smoking status  . Current Every Day Smoker -- 30 years  . Types: Cigarettes  Smokeless tobacco  . Never Used    Comment: 3 cig./day and several e-cigarettes/day    History  Alcohol Use  . Yes    Comment: seldom    Family History  Problem Relation Age of Onset  .  Cancer Mother     Breast  . Hypertension Father   . Heart failure Father     CHF  . Depression Sister     previous history of  . Anxiety disorder Sister     Previous history of  . Depression Brother   . Anxiety disorder Brother   . Ulcers Brother     PUD  . Cancer Maternal Aunt     X several w/ breast cancer, various ages  . Cancer Paternal Uncle     X several w/ prostate cancer  . Cancer Maternal Grandmother     Review of Systems: The review of systems is positive for fatigue.  All other systems were reviewed and are negative.  Physical Exam: BP 110/78  Pulse 62  Ht 5\' 7"  (1.702 m)  Wt 205 lb (92.987 kg)  BMI 32.1 kg/m2 Patient is alert and in no acute distress. She looks older than her stated age. Skin is  warm and dry. Color is normal.  HEENT is unremarkable. Normocephalic/atraumatic. PERRL. Sclera are nonicteric. Neck is supple. No masses. No JVD. Lungs are coarse. Cardiac exam shows a regular rate and rhythm. Abdomen is soft. Extremities are without edema. Gait and ROM are intact. No gross neurologic deficits noted.  LABORATORY DATA:   EKG today shows sinus with a PVC. No acute changes.   Lab Results  Component Value Date   WBC 11.1* 06/15/2012   HGB 13.8 06/15/2012   HCT 39.6 06/15/2012   PLT 246 06/15/2012   GLUCOSE 121* 06/15/2012   CHOL 201* 01/30/2012   TRIG 118.0 01/30/2012   HDL 45.10 01/30/2012   LDLDIRECT 144.2 01/30/2012   LDLCALC 108* 09/17/2011   ALT 21 01/30/2012   AST 18 01/30/2012   NA 140 06/15/2012   K 4.6 06/15/2012   CL 105 06/15/2012   CREATININE 0.87 06/15/2012   BUN 20 06/15/2012   CO2 29 06/15/2012   TSH 1.49 01/30/2012    Assessment / Plan: 1. Chest pain with radiation to left neck/jaw/ear - relieved with aspirin/NTG - concerning for angina. Refused admission last night. Past negative stress echo. I have arranged for cardiac cath tomorrow. The procedure, risks and benefits including allergy, bleeding, bruising, stroke, MI, irregular rhythms and even death were discussed. She is willing to proceed. NTG was called to her drug store. I am repeating her Troponin and d dimer and told her that if these were positive then she would be asked to return to the hospital.   2. HLD - checking today  3. Tobacco abuse - trying to cut back and using E cig - has smoked for at least 30 years with 1 pk/per day previously.   Patient is agreeable to this plan and will call if any problems develop in the interim.

## 2012-06-17 NOTE — OR Nursing (Signed)
Tegaderm dressing applied, site level 0, bedrest began at 1250

## 2012-06-17 NOTE — OR Nursing (Signed)
Meal served 

## 2012-06-17 NOTE — Progress Notes (Signed)
Pt arrived from cath  Procedure.  Tolerated fluids well.  Dr Katherina Right in to talk to pt and family about result of test and plan of care.

## 2012-06-17 NOTE — OR Nursing (Signed)
Dr Nahser at bedside to discuss results and treatment plan with pt and family 

## 2012-06-17 NOTE — Interval H&P Note (Signed)
History and Physical Interval Note:  06/17/2012 12:17 PM  Andrea Bowman  has presented today for surgery, with the diagnosis of cp  The various methods of treatment have been discussed with the patient and family. After consideration of risks, benefits and other options for treatment, the patient has consented to  Procedure(s): JV LEFT HEART CATHETERIZATION WITH CORONARY ANGIOGRAM (N/A) as a surgical intervention .  The patient's history has been reviewed, patient examined, no change in status, stable for surgery.  I have reviewed the patient's chart and labs.  Questions were answered to the patient's satisfaction.     Elyn Aquas.

## 2012-06-17 NOTE — CV Procedure (Signed)
    Cardiac Cath Note  ALYRIA KRACK [id number] 04/02/1961  Procedure: Left  Heart Cardiac Catheterization Note Indications: Chest pain,    Procedure Details Consent: Obtained Time Out: Verified patient identification, verified procedure, site/side was marked, verified correct patient position, special equipment/implants available, Radiology Safety Procedures followed,  medications/allergies/relevent history reviewed, required imaging and test results available.  Performed   Medications: Fentanyl: 50 mcg IV Versed: 2 mg IV  The right femoral artery was easily canulated using a modified Seldinger technique.  Hemodynamics:    LV pressure: 140 / 17 Aortic pressure: 137/72  Angiography   Left Main: normal  Left anterior Descending: smooth and normal ,  Wraps around the apex and supplies the inferior apical wall. 1st diagonal - normal  Left Circumflex: smooth and normal.  1st OM is normal  Right Coronary Artery: moderate sized, dominant, smooth and normal  LV Gram: normal LV function, EF 60-65%  Complications: No apparent complications Patient did tolerate procedure well.  Contrast used: 60 cc  Conclusions:   1. Smooth and normal coronaries 2. Normal LV function  Vesta Mixer, Montez Hageman., MD, [facility] 06/17/2012, 12:36 PM Office - [phone] Pager [phone]

## 2012-06-20 ENCOUNTER — Other Ambulatory Visit: Payer: Self-pay

## 2012-06-20 MED ORDER — SERTRALINE HCL 50 MG PO TABS: 50.0000 mg | ORAL_TABLET | Freq: Every day | ORAL | Status: DC

## 2012-06-23 ENCOUNTER — Ambulatory Visit: Payer: Managed Care, Other (non HMO) | Admitting: Family Medicine

## 2012-07-02 ENCOUNTER — Ambulatory Visit: Payer: Managed Care, Other (non HMO) | Admitting: Family Medicine

## 2012-07-09 ENCOUNTER — Encounter: Payer: Managed Care, Other (non HMO) | Admitting: Cardiology

## 2012-09-29 ENCOUNTER — Other Ambulatory Visit: Payer: Self-pay | Admitting: Family Medicine

## 2012-09-29 DIAGNOSIS — F329 Major depressive disorder, single episode, unspecified: Secondary | ICD-10-CM

## 2012-09-29 DIAGNOSIS — F419 Anxiety disorder, unspecified: Secondary | ICD-10-CM

## 2012-09-29 MED ORDER — CLONAZEPAM 0.5 MG PO TABS: 0.5000 mg | ORAL_TABLET | Freq: Three times a day (TID) | ORAL | Status: DC | PRN

## 2012-09-29 NOTE — Telephone Encounter (Signed)
Please advise refill? Last RX was done on 02-06-12 quantity 60 with 2 refills  If ok fax to [phone]

## 2012-09-29 NOTE — Telephone Encounter (Signed)
I have approved 1 rf on med but warn her we need a visit roughly every 6 months to maintain this since it is a controlled substance. So will need to be seen for more

## 2012-09-29 NOTE — Telephone Encounter (Signed)
Refill-clonazepam 0.5mg  tablet. Take one tablet by mouth 3 times a day as needed for anxiety. Last fill 1.31.14

## 2012-09-29 NOTE — Telephone Encounter (Signed)
I have approved just the few Alprazolam but remind him this is similar to Diazepam so he should not take together

## 2012-11-21 ENCOUNTER — Other Ambulatory Visit: Payer: Self-pay

## 2012-12-18 ENCOUNTER — Telehealth: Payer: Self-pay

## 2012-12-18 NOTE — Telephone Encounter (Signed)
Patient informed and states she will restart the zoloft 50 mg that she has left over and will continue the Klonopin as is.  Pt states she will do Zoloft 1/2 tab for a couple days and increase to whole tablet over the weekend if needed

## 2012-12-18 NOTE — Telephone Encounter (Signed)
Patient called and stated she was very embarrassed to say this but her husband just told her that he wants out of there marriage of 35 years.Klonopin taking it as prescribed since Monday. Pt usually just took very seldomly. Pt would like a coping mechanism until she can wrap herself around this. Pt stated she stopped Zoloft 2 months ago on her own.  Pt is willing to come in. I got patient an appt for Sept 15th at 8:15.   Pt would like to know if there is anything else she can take until Mondays appt?  Please advise?

## 2012-12-18 NOTE — Telephone Encounter (Signed)
She can have an increase in her Klonopin til she comes in and/or we can restart her Sertraline too.

## 2012-12-22 ENCOUNTER — Ambulatory Visit: Payer: Managed Care, Other (non HMO) | Admitting: Family Medicine

## 2012-12-25 ENCOUNTER — Ambulatory Visit (INDEPENDENT_AMBULATORY_CARE_PROVIDER_SITE_OTHER): Payer: Managed Care, Other (non HMO) | Admitting: Family Medicine

## 2012-12-25 ENCOUNTER — Encounter: Payer: Self-pay | Admitting: Family Medicine

## 2012-12-25 VITALS — BP 110/78 | HR 75 | Temp 98.0°F | Resp 16 | Ht 66.75 in | Wt 205.0 lb

## 2012-12-25 DIAGNOSIS — F32A Depression, unspecified: Secondary | ICD-10-CM

## 2012-12-25 DIAGNOSIS — F341 Dysthymic disorder: Secondary | ICD-10-CM

## 2012-12-25 DIAGNOSIS — F411 Generalized anxiety disorder: Secondary | ICD-10-CM

## 2012-12-25 DIAGNOSIS — F17200 Nicotine dependence, unspecified, uncomplicated: Secondary | ICD-10-CM

## 2012-12-25 DIAGNOSIS — R002 Palpitations: Secondary | ICD-10-CM

## 2012-12-25 DIAGNOSIS — F419 Anxiety disorder, unspecified: Secondary | ICD-10-CM

## 2012-12-25 DIAGNOSIS — R609 Edema, unspecified: Secondary | ICD-10-CM

## 2012-12-25 DIAGNOSIS — F329 Major depressive disorder, single episode, unspecified: Secondary | ICD-10-CM

## 2012-12-25 LAB — HEPATIC FUNCTION PANEL
AST: 15 U/L (ref 0–37)
Albumin: 4 g/dL (ref 3.5–5.2)
Alkaline Phosphatase: 54 U/L (ref 39–117)
Bilirubin, Direct: 0.1 mg/dL (ref 0.0–0.3)
Total Bilirubin: 0.3 mg/dL (ref 0.3–1.2)

## 2012-12-25 LAB — RENAL FUNCTION PANEL
BUN: 10 mg/dL (ref 6–23)
CO2: 30 mEq/L (ref 19–32)
Chloride: 104 mEq/L (ref 96–112)
Creat: 0.85 mg/dL (ref 0.50–1.10)
Phosphorus: 3.5 mg/dL (ref 2.3–4.6)
Potassium: 4.3 mEq/L (ref 3.5–5.3)

## 2012-12-25 LAB — CBC
Hemoglobin: 15.3 g/dL — ABNORMAL HIGH (ref 12.0–15.0)
MCH: 33 pg (ref 26.0–34.0)
Platelets: 240 10*3/uL (ref 150–400)
RBC: 4.63 MIL/uL (ref 3.87–5.11)
WBC: 8.2 10*3/uL (ref 4.0–10.5)

## 2012-12-25 MED ORDER — FUROSEMIDE 20 MG PO TABS: 20.0000 mg | ORAL_TABLET | Freq: Every day | ORAL | Status: DC | PRN

## 2012-12-25 MED ORDER — ESCITALOPRAM OXALATE 10 MG PO TABS: 10.0000 mg | ORAL_TABLET | Freq: Every day | ORAL | Status: DC

## 2012-12-25 MED ORDER — CLONAZEPAM 0.5 MG PO TABS: 0.5000 mg | ORAL_TABLET | Freq: Three times a day (TID) | ORAL | Status: DC | PRN

## 2012-12-25 NOTE — Progress Notes (Signed)
Patient ID: Andrea Bowman, female   DOB: 01-17-1961, 52 y.o.   MRN: [id number] Andrea Bowman [id number] 06-09-1960 12/25/2012      Progress Note-Follow Up  Subjective  Chief Complaint  Chief Complaint  Patient presents with  . Depression    Pt states she stopped zoloft 2 months ago. Pt feels she needs to restart medication after recent events in her life. Thinks she may need to try something different.    HPI  Patient is a 52 year old female who is in today with high level of stress. He has had recent worsening stressors at her current meds. Denies suicidal ideation but does acknowledge high anxiety, difficulty concentrating and agitation. No chest pain or palpitations. No shortness of breath GI or GU complaints today.  Past Medical History  Diagnosis Date  . PVC (premature ventricular contraction)     takes Atenolol   . Anxiety     panic attacks  . Depression   . 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 Osteoarthritis of knee     right  . 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 Perimenopausal   . Dental infection     will finish antibiotic 05/01/2012  . Complication of anesthesia     hard to wake up post-op  . Dental bridge present     upper    Past Surgical History  Procedure Laterality Date  . Cholecystectomy  2007  . Knee arthroscopy with lateral release  05/02/2012    Procedure: KNEE ARTHROSCOPY WITH LATERAL RELEASE;  Surgeon: Harvie Junior, MD;  Location: [facility];  Service: Orthopedics;  Laterality: Right;  chondroplasty with lateral release    Family History  Problem Relation Age of Onset  . Cancer Mother     Breast  . Hypertension Father   . Heart failure Father     CHF  . Depression Sister     previous history of  . Anxiety disorder Sister     Previous history of  . Depression Brother   . Anxiety disorder Brother   . Ulcers Brother     PUD  . Cancer Maternal Aunt     X several w/ breast  cancer, various ages  . Cancer Paternal Uncle     X several w/ prostate cancer  . Cancer Maternal Grandmother     History   Social History  . Marital Status: Married    Spouse Name: N/A    Number of Children: N/A  . Years of Education: N/A   Occupational History  . Not on file.   Social History Main Topics  . Smoking status: Current Every Day Smoker -- 30 years    Types: Cigarettes  . Smokeless tobacco: Never Used     Comment: 3 cig./day and several e-cigarettes/day  . Alcohol Use: Yes     Comment: seldom  . Drug Use: No  . Sexual Activity: Yes    Partners: Male   Other Topics Concern  . Not on file   Social History Narrative  . No narrative on file    Current Outpatient Prescriptions on File Prior to Visit  Medication Sig Dispense Refill  . atenolol (TENORMIN) 25 MG tablet Take 25 mg by mouth 2 (two) times daily.       Marland Kitchen ibuprofen (ADVIL,MOTRIN) 200 MG tablet Take 600 mg by mouth every 6 (six) hours as needed for pain.      . nitroGLYCERIN (NITROSTAT) 0.4 MG SL tablet Place  1 tablet (0.4 mg total) under the tongue every 5 (five) minutes as needed for chest pain.  25 tablet  3   No current facility-administered medications on file prior to visit.    Allergies  Allergen Reactions  . Prednisone Other (See Comments)    REACTION: REDNESS OF FACE    Review of Systems  Review of Systems  Constitutional: Negative for fever and malaise/fatigue.  HENT: Negative for congestion.   Eyes: Negative for discharge.  Respiratory: Negative for shortness of breath.   Cardiovascular: Negative for chest pain, palpitations and leg swelling.  Gastrointestinal: Negative for nausea, abdominal pain and diarrhea.  Genitourinary: Negative for dysuria.  Musculoskeletal: Negative for falls.  Skin: Negative for rash.  Neurological: Negative for loss of consciousness and headaches.  Endo/Heme/Allergies: Negative for polydipsia.  Psychiatric/Behavioral: Positive for depression.  Negative for suicidal ideas. The patient is nervous/anxious. The patient does not have insomnia.     Objective  BP 110/78  Pulse 75  Temp(Src) 98 F (36.7 C) (Oral)  Resp 16  Ht 5' 6.75" (1.695 m)  Wt 205 lb 0.6 oz (93.006 kg)  BMI 32.37 kg/m2  SpO2 97%  Physical Exam  Physical Exam  Constitutional: She is oriented to person, place, and time and well-developed, well-nourished, and in no distress. No distress.  HENT:  Head: Normocephalic and atraumatic.  Eyes: Conjunctivae are normal.  Neck: Neck supple. No thyromegaly present.  Cardiovascular: Normal rate, regular rhythm and normal heart sounds.   No murmur heard. Pulmonary/Chest: Effort normal and breath sounds normal. She has no wheezes.  Abdominal: She exhibits no distension and no mass.  Musculoskeletal: She exhibits no edema.  Lymphadenopathy:    She has no cervical adenopathy.  Neurological: She is alert and oriented to person, place, and time.  Skin: Skin is warm and dry. No rash noted. She is not diaphoretic.  Psychiatric: Memory, affect and judgment normal.    Lab Results  Component Value Date   TSH 1.49 06/16/2012   Lab Results  Component Value Date   WBC 8.2 12/25/2012   HGB 15.3* 12/25/2012   HCT 43.8 12/25/2012   MCV 94.6 12/25/2012   PLT 240 12/25/2012   Lab Results  Component Value Date   CREATININE 0.8 06/16/2012   BUN 15 06/16/2012   NA 140 06/16/2012   K 4.1 06/16/2012   CL 106 06/16/2012   CO2 28 06/16/2012   Lab Results  Component Value Date   ALT 18 06/16/2012   AST 17 06/16/2012   ALKPHOS 52 06/16/2012   BILITOT 0.6 06/16/2012   Lab Results  Component Value Date   CHOL 188 06/16/2012   Lab Results  Component Value Date   HDL 54.30 06/16/2012   Lab Results  Component Value Date   LDLCALC 107* 06/16/2012   Lab Results  Component Value Date   TRIG 135.0 06/16/2012   Lab Results  Component Value Date   CHOLHDL 3 06/16/2012     Assessment & Plan  ANXIETY DEPRESSION qill d/c Zoloft  and try Lexapro daily, reassess at next visit.  TOBACCO ABUSE Encouraged complete cessation but patient is under a great deal of stress at this time so is noncomittal

## 2012-12-25 NOTE — Patient Instructions (Addendum)
Start a probiotic such as Digestive Advantage daily Try ginger caps/tea etc for nausea Coldeeze  Mucinex 600 mg twice a day  Depression, Adult Depression refers to feeling sad, low, down in the dumps, blue, gloomy, or empty. In general, there are two kinds of depression: 1. Depression that we all experience from time to time because of upsetting life experiences, including the loss of a job or the ending of a relationship (normal sadness or normal grief). This kind of depression is considered normal, is short lived, and resolves within a few days to 2 weeks. (Depression experienced after the loss of a loved one is called bereavement. Bereavement often lasts longer than 2 weeks but normally gets better with time.) 2. Clinical depression, which lasts longer than normal sadness or normal grief or interferes with your ability to function at home, at work, and in school. It also interferes with your personal relationships. It affects almost every aspect of your life. Clinical depression is an illness. Symptoms of depression also can be caused by conditions other than normal sadness and grief or clinical depression. Examples of these conditions are listed as follows:  Physical illness Some physical illnesses, including underactive thyroid gland (hypothyroidism), severe anemia, specific types of cancer, diabetes, uncontrolled seizures, heart and lung problems, strokes, and chronic pain are commonly associated with symptoms of depression.  Side effects of some prescription medicine In some people, certain types of prescription medicine can cause symptoms of depression.  Substance abuse Abuse of alcohol and illicit drugs can cause symptoms of depression. SYMPTOMS Symptoms of normal sadness and normal grief include the following:  Feeling sad or crying for short periods of time.  Not caring about anything (apathy).  Difficulty sleeping or sleeping too much.  No longer able to enjoy the things you used  to enjoy.  Desire to be by oneself all the time (social isolation).  Lack of energy or motivation.  Difficulty concentrating or remembering.  Change in appetite or weight.  Restlessness or agitation. Symptoms of clinical depression include the same symptoms of normal sadness or normal grief and also the following symptoms:  Feeling sad or crying all the time.  Feelings of guilt or worthlessness.  Feelings of hopelessness or helplessness.  Thoughts of suicide or the desire to harm yourself (suicidal ideation).  Loss of touch with reality (psychotic symptoms). Seeing or hearing things that are not real (hallucinations) or having false beliefs about your life or the people around you (delusions and paranoia). DIAGNOSIS  The diagnosis of clinical depression usually is based on the severity and duration of the symptoms. Your caregiver also will ask you questions about your medical history and substance use to find out if physical illness, use of prescription medicine, or substance abuse is causing your depression. Your caregiver also may order blood tests. TREATMENT  Typically, normal sadness and normal grief do not require treatment. However, sometimes antidepressant medicine is prescribed for bereavement to ease the depressive symptoms until they resolve. The treatment for clinical depression depends on the severity of your symptoms but typically includes antidepressant medicine, counseling with a mental health professional, or a combination of both. Your caregiver will help to determine what treatment is best for you. Depression caused by physical illness usually goes away with appropriate medical treatment of the illness. If prescription medicine is causing depression, talk with your caregiver about stopping the medicine, decreasing the dose, or substituting another medicine. Depression caused by abuse of alcohol or illicit drugs abuse goes away with abstinence  from these substances. Some  adults need professional help in order to stop drinking or using drugs. SEEK IMMEDIATE CARE IF:  You have thoughts about hurting yourself or others.  You lose touch with reality (have psychotic symptoms).  You are taking medicine for depression and have a serious side effect. FOR MORE INFORMATION National Alliance on Mental Illness: www.nami.[employer]: http://www.maynard.net/ Document Released: 03/23/2000 Document Revised: 09/25/2011 Document Reviewed: 06/25/2011 [facility] Patient Information 2014 [place], [place]. DASH Diet The DASH diet stands for "Dietary Approaches to Stop Hypertension." It is a healthy eating plan that has been shown to reduce high blood pressure (hypertension) in as little as 14 days, while also possibly providing other significant health benefits. These other health benefits include reducing the risk of breast cancer after menopause and reducing the risk of type 2 diabetes, heart disease, colon cancer, and stroke. Health benefits also include weight loss and slowing kidney failure in patients with chronic kidney disease.  DIET GUIDELINES  Limit salt (sodium). Your diet should contain less than 1500 mg of sodium daily.  Limit refined or processed carbohydrates. Your diet should include mostly whole grains. Desserts and added sugars should be used sparingly.  Include small amounts of heart-healthy fats. These types of fats include nuts, oils, and tub margarine. Limit saturated and trans fats. These fats have been shown to be harmful in the body. CHOOSING FOODS  The following food groups are based on a 2000 calorie diet. See your Registered Dietitian for individual calorie needs. Grains and Grain Products (6 to 8 servings daily)  Eat More Often: Whole-wheat bread, brown rice, whole-grain or wheat pasta, quinoa, popcorn without added fat or salt (air popped).  Eat Less Often: White bread, white pasta, white rice, cornbread. Vegetables (4 to 5  servings daily)  Eat More Often: Fresh, frozen, and canned vegetables. Vegetables may be raw, steamed, roasted, or grilled with a minimal amount of fat.  Eat Less Often/Avoid: Creamed or fried vegetables. Vegetables in a cheese sauce. Fruit (4 to 5 servings daily)  Eat More Often: All fresh, canned (in natural juice), or frozen fruits. Dried fruits without added sugar. One hundred percent fruit juice ( cup [237 mL] daily).  Eat Less Often: Dried fruits with added sugar. Canned fruit in light or heavy syrup. [employer], Fish, and Poultry (2 servings or less daily. One serving is 3 to 4 oz [85-114 g]).  Eat More Often: Ninety percent or leaner ground beef, tenderloin, sirloin. Round cuts of beef, chicken breast, [place] breast. All fish. Grill, bake, or broil your meat. Nothing should be fried.  Eat Less Often/Avoid: Fatty cuts of meat, [place], or chicken leg, thigh, or wing. Fried cuts of meat or fish. Dairy (2 to 3 servings)  Eat More Often: Low-fat or fat-free milk, low-fat plain or light yogurt, reduced-fat or part-skim cheese.  Eat Less Often/Avoid: Milk (whole, 2%).Whole milk yogurt. Full-fat cheeses. Nuts, Seeds, and Legumes (4 to 5 servings per week)  Eat More Often: All without added salt.  Eat Less Often/Avoid: Salted nuts and seeds, canned beans with added salt. Fats and Sweets (limited)  Eat More Often: Vegetable oils, tub margarines without trans fats, sugar-free gelatin. Mayonnaise and salad dressings.  Eat Less Often/Avoid: Coconut oils, palm oils, butter, stick margarine, cream, half and half, cookies, candy, pie. FOR MORE INFORMATION The Dash Diet Eating Plan: www.dashdiet.org Document Released: 03/15/2011 Document Revised: 06/18/2011 Document Reviewed: 03/15/2011 [facility] Patient Information 2014 [place], [place].

## 2012-12-27 NOTE — Assessment & Plan Note (Signed)
qill d/c Zoloft and try Lexapro daily, reassess at next visit.

## 2012-12-27 NOTE — Assessment & Plan Note (Signed)
Encouraged complete cessation but patient is under a great deal of stress at this time so is noncomittal

## 2012-12-30 ENCOUNTER — Other Ambulatory Visit: Payer: Self-pay | Admitting: Family Medicine

## 2012-12-30 DIAGNOSIS — Z1231 Encounter for screening mammogram for malignant neoplasm of breast: Secondary | ICD-10-CM

## 2013-01-29 ENCOUNTER — Ambulatory Visit (INDEPENDENT_AMBULATORY_CARE_PROVIDER_SITE_OTHER): Payer: Managed Care, Other (non HMO)

## 2013-01-29 DIAGNOSIS — Z1231 Encounter for screening mammogram for malignant neoplasm of breast: Secondary | ICD-10-CM

## 2013-02-09 ENCOUNTER — Ambulatory Visit: Payer: Managed Care, Other (non HMO) | Admitting: Family Medicine

## 2013-02-10 ENCOUNTER — Telehealth: Payer: Self-pay | Admitting: Family Medicine

## 2013-02-10 ENCOUNTER — Encounter: Payer: Self-pay | Admitting: Family Medicine

## 2013-02-10 ENCOUNTER — Ambulatory Visit (INDEPENDENT_AMBULATORY_CARE_PROVIDER_SITE_OTHER): Payer: Managed Care, Other (non HMO) | Admitting: Family Medicine

## 2013-02-10 VITALS — BP 100/62 | HR 98 | Temp 98.5°F | Ht 66.75 in | Wt 207.1 lb

## 2013-02-10 DIAGNOSIS — E785 Hyperlipidemia, unspecified: Secondary | ICD-10-CM

## 2013-02-10 DIAGNOSIS — Z0000 Encounter for general adult medical examination without abnormal findings: Secondary | ICD-10-CM

## 2013-02-10 DIAGNOSIS — F411 Generalized anxiety disorder: Secondary | ICD-10-CM

## 2013-02-10 DIAGNOSIS — F419 Anxiety disorder, unspecified: Secondary | ICD-10-CM

## 2013-02-10 DIAGNOSIS — E559 Vitamin D deficiency, unspecified: Secondary | ICD-10-CM

## 2013-02-10 DIAGNOSIS — F329 Major depressive disorder, single episode, unspecified: Secondary | ICD-10-CM

## 2013-02-10 DIAGNOSIS — J209 Acute bronchitis, unspecified: Secondary | ICD-10-CM

## 2013-02-10 DIAGNOSIS — F341 Dysthymic disorder: Secondary | ICD-10-CM

## 2013-02-10 MED ORDER — HYDROCOD POLST-CPM POLST ER 10-8 MG PO CP12: 1.0000 | ORAL_CAPSULE | Freq: Every evening | ORAL | Status: DC | PRN

## 2013-02-10 MED ORDER — CLONAZEPAM 0.5 MG PO TABS: 0.5000 mg | ORAL_TABLET | Freq: Three times a day (TID) | ORAL | Status: DC | PRN

## 2013-02-10 MED ORDER — ALBUTEROL SULFATE HFA 108 (90 BASE) MCG/ACT IN AERS: 2.0000 | INHALATION_SPRAY | Freq: Four times a day (QID) | RESPIRATORY_TRACT | Status: DC | PRN

## 2013-02-10 MED ORDER — ESCITALOPRAM OXALATE 20 MG PO TABS: 20.0000 mg | ORAL_TABLET | Freq: Every day | ORAL | Status: DC

## 2013-02-10 NOTE — Patient Instructions (Addendum)
Mucinex 600 mg po twice a day, probiotics increased fluids  Bronchitis Bronchitis is the body's way of reacting to injury and/or infection (inflammation) of the bronchi. Bronchi are the air tubes that extend from the windpipe into the lungs. If the inflammation becomes severe, it may cause shortness of breath. CAUSES  Inflammation may be caused by:  A virus.  Germs (bacteria).  Dust.  Allergens.  Pollutants and many other irritants. The cells lining the bronchial tree are covered with tiny hairs (cilia). These constantly beat upward, away from the lungs, toward the mouth. This keeps the lungs free of pollutants. When these cells become too irritated and are unable to do their job, mucus begins to develop. This causes the characteristic cough of bronchitis. The cough clears the lungs when the cilia are unable to do their job. Without either of these protective mechanisms, the mucus would settle in the lungs. Then you would develop pneumonia. Smoking is a common cause of bronchitis and can contribute to pneumonia. Stopping this habit is the single most important thing you can do to help yourself. TREATMENT   Your caregiver may prescribe an antibiotic if the cough is caused by bacteria. Also, medicines that open up your airways make it easier to breathe. Your caregiver may also recommend or prescribe an expectorant. It will loosen the mucus to be coughed up. Only take over-the-counter or prescription medicines for pain, discomfort, or fever as directed by your caregiver.  Removing whatever causes the problem (smoking, for example) is critical to preventing the problem from getting worse.  Cough suppressants may be prescribed for relief of cough symptoms.  Inhaled medicines may be prescribed to help with symptoms now and to help prevent problems from returning.  For those with recurrent (chronic) bronchitis, there may be a need for steroid medicines. SEEK IMMEDIATE MEDICAL CARE IF:    During treatment, you develop more pus-like mucus (purulent sputum).  You have a fever.  Your baby is older than 3 months with a rectal temperature of 102 F (38.9 C) or higher.  Your baby is 22 months old or younger with a rectal temperature of 100.4 F (38 C) or higher.  You become progressively more ill.  You have increased difficulty breathing, wheezing, or shortness of breath. It is necessary to seek immediate medical care if you are elderly or sick from any other disease. MAKE SURE YOU:   Understand these instructions.  Will watch your condition.  Will get help right away if you are not doing well or get worse. Document Released: 03/26/2005 Document Revised: 06/18/2011 Document Reviewed: 02/03/2008 [facility] Patient Information 2014 [place], [place].

## 2013-02-10 NOTE — Telephone Encounter (Signed)
Labs prior to visit lipid, renal, cbc, tsh, hepatic, hgba1c, Vitamin d, urinalysis Next visit annual   Patient has cpe 05/07/13 and will be going to [facility] lab

## 2013-02-15 ENCOUNTER — Encounter: Payer: Self-pay | Admitting: Family Medicine

## 2013-02-15 NOTE — Progress Notes (Signed)
Patient ID: Andrea Bowman, female   DOB: 16-Oct-1960, 52 y.o.   MRN: [id number] Andrea Bowman [id number] 11/26/1960 02/15/2013      Progress Note-Follow Up  Subjective  Chief Complaint  Chief Complaint  Patient presents with  . Follow-up    6 week- discuss Lexapro    HPI  Patient is a 52 yo female who is in today for follow up at there last visit we started her on lexapro for anxiety and depression. She is better but not fully. No suicidal ideation. Improving anhedonia, no anxiety attacks. No cp/palp/gi or gu c/o. Is noting some sob, wheezing, congestion. Was recently seen at an urgent care and given a course of Augmentin, fevers cleared up but wheezing and dry cough persist.   Past Medical History  Diagnosis Date  . PVC (premature ventricular contraction)     takes Atenolol   . Anxiety     panic attacks  . Depression   . 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 Osteoarthritis of knee     right  . 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 Perimenopausal   . Dental infection     will finish antibiotic 05/01/2012  . Complication of anesthesia     hard to wake up post-op  . Dental bridge present     upper  . Acute bronchitis 08/24/2010    Past Surgical History  Procedure Laterality Date  . Cholecystectomy  2007  . Knee arthroscopy with lateral release  05/02/2012    Procedure: KNEE ARTHROSCOPY WITH LATERAL RELEASE;  Surgeon: Harvie Junior, MD;  Location: [facility];  Service: Orthopedics;  Laterality: Right;  chondroplasty with lateral release    Family History  Problem Relation Age of Onset  . Cancer Mother     Breast  . Hypertension Father   . Heart failure Father     CHF  . Depression Sister     previous history of  . Anxiety disorder Sister     Previous history of  . Depression Brother   . Anxiety disorder Brother   . Ulcers Brother     PUD  . Cancer Maternal Aunt     X several w/ breast cancer, various  ages  . Cancer Paternal Uncle     X several w/ prostate cancer  . Cancer Maternal Grandmother     History   Social History  . Marital Status: Married    Spouse Name: N/A    Number of Children: N/A  . Years of Education: N/A   Occupational History  . Not on file.   Social History Main Topics  . Smoking status: Current Every Day Smoker -- 30 years    Types: Cigarettes  . Smokeless tobacco: Never Used     Comment: 3 cig./day and several e-cigarettes/day  . Alcohol Use: Yes     Comment: seldom  . Drug Use: No  . Sexual Activity: Yes    Partners: Male   Other Topics Concern  . Not on file   Social History Narrative  . No narrative on file    Current Outpatient Prescriptions on File Prior to Visit  Medication Sig Dispense Refill  . atenolol (TENORMIN) 25 MG tablet Take 25 mg by mouth 2 (two) times daily.       . furosemide (LASIX) 20 MG tablet Take 1 tablet (20 mg total) by mouth daily as needed for fluid or edema.  30 tablet  3  .  ibuprofen (ADVIL,MOTRIN) 200 MG tablet Take 600 mg by mouth every 6 (six) hours as needed for pain.      . nitroGLYCERIN (NITROSTAT) 0.4 MG SL tablet Place 1 tablet (0.4 mg total) under the tongue every 5 (five) minutes as needed for chest pain.  25 tablet  3   No current facility-administered medications on file prior to visit.    Allergies  Allergen Reactions  . Prednisone Other (See Comments)    REACTION: REDNESS OF FACE    Review of Systems  Review of Systems  Constitutional: Negative for fever and malaise/fatigue.  HENT: Positive for congestion.   Eyes: Negative for discharge.  Respiratory: Positive for cough, shortness of breath and wheezing.   Cardiovascular: Negative for chest pain, palpitations and leg swelling.  Gastrointestinal: Negative for nausea, abdominal pain and diarrhea.  Genitourinary: Negative for dysuria.  Musculoskeletal: Negative for falls.  Skin: Negative for rash.  Neurological: Positive for headaches.  Negative for loss of consciousness.  Endo/Heme/Allergies: Negative for polydipsia.  Psychiatric/Behavioral: Positive for depression. Negative for suicidal ideas. The patient is nervous/anxious. The patient does not have insomnia.     Objective  BP 100/62  Pulse 98  Temp(Src) 98.5 F (36.9 C) (Oral)  Ht 5' 6.75" (1.695 m)  Wt 207 lb 1.9 oz (93.949 kg)  BMI 32.70 kg/m2  SpO2 98%  Physical Exam  Physical Exam  Constitutional: She is oriented to person, place, and time and well-developed, well-nourished, and in no distress. No distress.  HENT:  Head: Normocephalic and atraumatic.  Eyes: Conjunctivae are normal.  Neck: Neck supple. No thyromegaly present.  Cardiovascular: Normal rate, regular rhythm and normal heart sounds.   No murmur heard. Pulmonary/Chest: Effort normal. She has wheezes.  Abdominal: She exhibits no distension and no mass.  Musculoskeletal: She exhibits no edema.  Lymphadenopathy:    She has no cervical adenopathy.  Neurological: She is alert and oriented to person, place, and time.  Skin: Skin is warm and dry. No rash noted. She is not diaphoretic.  Psychiatric: Memory, affect and judgment normal.    Lab Results  Component Value Date   TSH 0.942 12/25/2012   Lab Results  Component Value Date   WBC 8.2 12/25/2012   HGB 15.3* 12/25/2012   HCT 43.8 12/25/2012   MCV 94.6 12/25/2012   PLT 240 12/25/2012   Lab Results  Component Value Date   CREATININE 0.85 12/25/2012   BUN 10 12/25/2012   NA 137 12/25/2012   K 4.3 12/25/2012   CL 104 12/25/2012   CO2 30 12/25/2012   Lab Results  Component Value Date   ALT 13 12/25/2012   AST 15 12/25/2012   ALKPHOS 54 12/25/2012   BILITOT 0.3 12/25/2012   Lab Results  Component Value Date   CHOL 188 06/16/2012   Lab Results  Component Value Date   HDL 54.30 06/16/2012   Lab Results  Component Value Date   LDLCALC 107* 06/16/2012   Lab Results  Component Value Date   TRIG 135.0 06/16/2012   Lab Results  Component  Value Date   CHOLHDL 3 06/16/2012     Assessment & Plan  Acute bronchitis Started on Tussicaps, Mucinex, Albuterol for a likely viral illness, has already taken a course of Augmentin, encouraged probiotics.   ANXIETY DEPRESSION Lexapro has helped some but not fully, will increase to 20 mg daily and may continue Diazeapm prn. Reassess at next visit.

## 2013-02-15 NOTE — Assessment & Plan Note (Signed)
Started on Tussicaps, Mucinex, Albuterol for a likely viral illness, has already taken a course of Augmentin, encouraged probiotics.

## 2013-02-15 NOTE — Assessment & Plan Note (Signed)
Lexapro has helped some but not fully, will increase to 20 mg daily and may continue Diazeapm prn. Reassess at next visit.

## 2013-02-20 NOTE — Telephone Encounter (Signed)
Lab order placed.

## 2013-04-08 ENCOUNTER — Other Ambulatory Visit: Payer: Self-pay | Admitting: Cardiology

## 2013-05-07 ENCOUNTER — Encounter: Payer: Managed Care, Other (non HMO) | Admitting: Family Medicine

## 2013-05-07 DIAGNOSIS — Z0289 Encounter for other administrative examinations: Secondary | ICD-10-CM

## 2013-05-30 ENCOUNTER — Other Ambulatory Visit: Payer: Self-pay | Admitting: Cardiology

## 2013-06-09 ENCOUNTER — Other Ambulatory Visit: Payer: Self-pay | Admitting: Family Medicine

## 2013-06-15 ENCOUNTER — Other Ambulatory Visit: Payer: Self-pay | Admitting: Family Medicine

## 2013-06-15 NOTE — Telephone Encounter (Signed)
Informed patient of medication refill and she scheduled follow up appointment for 07/02/13. She did not want cpe at this time.

## 2013-06-15 NOTE — Telephone Encounter (Signed)
Pt last seen 02/10/13 and no showed her physical in January. Pt has no future appts on file with us.  Clonazepam refill last sent 04/08/13. Called in #90.  Pt should reschedule her physical soon.  Please call pt to arrange appt.  Medication name:  Name from pharmacy:  clonazePAM (KLONOPIN) 0.5 MG tablet  CLONAZEPAM 0.5 MG TABLET Sig: TAKE 1 TABLET BY MOUTH 3 TIMES A DAY AS NEEDED FOR ANXIETY Dispense: 90 tablet Refills: 1 Start: 06/15/2013 Class: Normal Requested on: 12/25/2012 Originally ordered on: 05/22/2010 Last refill: 04/08/2013

## 2013-07-02 ENCOUNTER — Telehealth: Payer: Self-pay | Admitting: Family Medicine

## 2013-07-02 ENCOUNTER — Encounter: Payer: Self-pay | Admitting: Family Medicine

## 2013-07-02 ENCOUNTER — Ambulatory Visit (INDEPENDENT_AMBULATORY_CARE_PROVIDER_SITE_OTHER): Payer: Managed Care, Other (non HMO) | Admitting: Family Medicine

## 2013-07-02 VITALS — BP 110/72 | HR 75 | Temp 97.9°F | Ht 66.75 in | Wt 200.1 lb

## 2013-07-02 DIAGNOSIS — R7309 Other abnormal glucose: Secondary | ICD-10-CM

## 2013-07-02 DIAGNOSIS — F32A Depression, unspecified: Secondary | ICD-10-CM

## 2013-07-02 DIAGNOSIS — R002 Palpitations: Secondary | ICD-10-CM

## 2013-07-02 DIAGNOSIS — F329 Major depressive disorder, single episode, unspecified: Secondary | ICD-10-CM

## 2013-07-02 DIAGNOSIS — F341 Dysthymic disorder: Secondary | ICD-10-CM

## 2013-07-02 DIAGNOSIS — N959 Unspecified menopausal and perimenopausal disorder: Secondary | ICD-10-CM

## 2013-07-02 DIAGNOSIS — E669 Obesity, unspecified: Secondary | ICD-10-CM

## 2013-07-02 DIAGNOSIS — T7840XA Allergy, unspecified, initial encounter: Secondary | ICD-10-CM

## 2013-07-02 DIAGNOSIS — E785 Hyperlipidemia, unspecified: Secondary | ICD-10-CM

## 2013-07-02 DIAGNOSIS — F419 Anxiety disorder, unspecified: Principal | ICD-10-CM

## 2013-07-02 DIAGNOSIS — R739 Hyperglycemia, unspecified: Secondary | ICD-10-CM

## 2013-07-02 DIAGNOSIS — J301 Allergic rhinitis due to pollen: Secondary | ICD-10-CM

## 2013-07-02 LAB — RENAL FUNCTION PANEL
ALBUMIN: 4 g/dL (ref 3.5–5.2)
BUN: 12 mg/dL (ref 6–23)
CO2: 28 meq/L (ref 19–32)
CREATININE: 0.7 mg/dL (ref 0.50–1.10)
Calcium: 9.1 mg/dL (ref 8.4–10.5)
Chloride: 106 mEq/L (ref 96–112)
Glucose, Bld: 133 mg/dL — ABNORMAL HIGH (ref 70–99)
Phosphorus: 3.7 mg/dL (ref 2.3–4.6)
Potassium: 4.2 mEq/L (ref 3.5–5.3)
Sodium: 143 mEq/L (ref 135–145)

## 2013-07-02 LAB — HEMOGLOBIN A1C
Hgb A1c MFr Bld: 6.1 % — ABNORMAL HIGH (ref <5.7)
MEAN PLASMA GLUCOSE: 128 mg/dL — AB (ref <117)

## 2013-07-02 MED ORDER — CLONAZEPAM 0.5 MG PO TABS: 0.5000 mg | ORAL_TABLET | Freq: Three times a day (TID) | ORAL | Status: DC | PRN

## 2013-07-02 MED ORDER — ESCITALOPRAM OXALATE 20 MG PO TABS: ORAL_TABLET | ORAL | Status: DC

## 2013-07-02 MED ORDER — MONTELUKAST SODIUM 10 MG PO TABS: 10.0000 mg | ORAL_TABLET | Freq: Every day | ORAL | Status: DC

## 2013-07-02 MED ORDER — ATENOLOL 25 MG PO TABS
25.0000 mg | ORAL_TABLET | Freq: Two times a day (BID) | ORAL | Status: DC
Start: 2013-07-02 — End: 2013-11-07

## 2013-07-02 NOTE — Progress Notes (Signed)
Patient ID: Andrea Bowman, female   DOB: September 11, 1960, 53 y.o.   MRN: [id number] Andrea Bowman [id number] [birth year] 07/02/2013      Progress Note-Follow Up   Subjective  Chief Complaint  Chief Complaint  Patient presents with  . Follow-up    med refills   HPI  Patient is a 53 year old female in today for routine medical care. She has been dealing with a great deal of stress. Her marriage has been struggling but they are reconciling at the moment. A nephew died suddenly in a car accident. Her friend has just times cancer. She was her care provider. She feels the Lexapro and Klonopin have been helpful but could be increased. She's noticed increased anxiety and anhedonia but denies suicidal ideation. No recent illness. Denies CP/palp/SOB/HA/congestion/fevers/GI or GU c/o. Taking meds as prescribed  Past Medical History  Diagnosis Date  . PVC (premature ventricular contraction)     takes Atenolol   . Anxiety     panic attacks  . Depression   . 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 Osteoarthritis of knee     right  . 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 Perimenopausal   . Dental infection     will finish antibiotic 05/01/2012  . Complication of anesthesia     hard to wake up post-op  . Dental bridge present     upper  . Acute bronchitis 08/24/2010    Past Surgical History  Procedure Laterality Date  . Cholecystectomy  2007  . Knee arthroscopy with lateral release  05/02/2012    Procedure: KNEE ARTHROSCOPY WITH LATERAL RELEASE;  Surgeon: Harvie Junior, MD;  Location: [facility];  Service: Orthopedics;  Laterality: Right;  chondroplasty with lateral release    Family History  Problem Relation Age of Onset  . Cancer Mother     Breast  . Hypertension Father   . Heart failure Father     CHF  . Depression Sister     previous history of  . Anxiety disorder Sister     Previous history of  . Depression Brother   .  Anxiety disorder Brother   . Ulcers Brother     PUD  . Cancer Maternal Aunt     X several w/ breast cancer, various ages  . Cancer Paternal Uncle     X several w/ prostate cancer  . Cancer Maternal Grandmother     History   Social History  . Marital Status: Married    Spouse Name: N/A    Number of Children: N/A  . Years of Education: N/A   Occupational History  . Not on file.   Social History Main Topics  . Smoking status: Current Every Day Smoker -- 30 years    Types: Cigarettes  . Smokeless tobacco: Never Used     Comment: 3 cig./day and several e-cigarettes/day  . Alcohol Use: Yes     Comment: seldom  . Drug Use: No  . Sexual Activity: Yes    Partners: Male   Other Topics Concern  . Not on file   Social History Narrative  . No narrative on file    Current Outpatient Prescriptions on File Prior to Visit  Medication Sig Dispense Refill  . albuterol (PROVENTIL HFA;VENTOLIN HFA) 108 (90 BASE) MCG/ACT inhaler Inhale 2 puffs into the lungs every 6 (six) hours as needed for wheezing or shortness of breath.  1 Inhaler  2  . atenolol (  TENORMIN) 25 MG tablet TAKE 1 TABLET BY MOUTH TWICE A DAY **NEED APPT**  60 tablet  0  . clonazePAM (KLONOPIN) 0.5 MG tablet TAKE 1 TABLET BY MOUTH 3 TIMES A DAY AS NEEDED FOR ANXIETY  90 tablet  0  . escitalopram (LEXAPRO) 20 MG tablet TAKE 1 TABLET (20 MG TOTAL) BY MOUTH DAILY.  30 tablet  3  . Hydrocod Polst-Chlorphen Polst (TUSSICAPS) 10-8 MG CP12 Take 1 capsule by mouth at bedtime as needed. cough  30 each  0  . ibuprofen (ADVIL,MOTRIN) 200 MG tablet Take 600 mg by mouth every 6 (six) hours as needed for pain.      . nitroGLYCERIN (NITROSTAT) 0.4 MG SL tablet Place 1 tablet (0.4 mg total) under the tongue every 5 (five) minutes as needed for chest pain.  25 tablet  3   No current facility-administered medications on file prior to visit.    Allergies  Allergen Reactions  . Prednisone Other (See Comments)    REACTION: REDNESS OF FACE     Review of Systems  Review of Systems  Constitutional: Negative for fever and malaise/fatigue.  HENT: Negative for congestion.   Eyes: Negative for discharge.  Respiratory: Negative for shortness of breath.   Cardiovascular: Negative for chest pain, palpitations and leg swelling.  Gastrointestinal: Negative for nausea, abdominal pain and diarrhea.  Genitourinary: Negative for dysuria.  Musculoskeletal: Negative for falls.  Skin: Negative for rash.  Neurological: Negative for loss of consciousness and headaches.  Endo/Heme/Allergies: Negative for polydipsia.  Psychiatric/Behavioral: Negative for depression and suicidal ideas. The patient is not nervous/anxious and does not have insomnia.     Objective  BP 110/72  Pulse 75  Temp(Src) 97.9 F (36.6 C) (Oral)  Ht 5' 6.75" (1.695 m)  Wt 200 lb 1.9 oz (90.774 kg)  BMI 31.60 kg/m2  SpO2 96%  Physical Exam  Physical Exam  Constitutional: She is oriented to person, place, and time and well-developed, well-nourished, and in no distress. No distress.  HENT:  Head: Normocephalic and atraumatic.  Eyes: Conjunctivae are normal.  Neck: Neck supple. No thyromegaly present.  Cardiovascular: Normal rate, regular rhythm and normal heart sounds.   No murmur heard. Pulmonary/Chest: Effort normal and breath sounds normal. She has no wheezes.  Abdominal: She exhibits no distension and no mass.  Musculoskeletal: She exhibits no edema.  Lymphadenopathy:    She has no cervical adenopathy.  Neurological: She is alert and oriented to person, place, and time.  Skin: Skin is warm and dry. No rash noted. She is not diaphoretic.  Psychiatric: Memory, affect and judgment normal.    Lab Results  Component Value Date   TSH 0.942 12/25/2012   Lab Results  Component Value Date   WBC 8.2 12/25/2012   HGB 15.3* 12/25/2012   HCT 43.8 12/25/2012   MCV 94.6 12/25/2012   PLT 240 12/25/2012   Lab Results  Component Value Date   CREATININE 0.85  12/25/2012   BUN 10 12/25/2012   NA 137 12/25/2012   K 4.3 12/25/2012   CL 104 12/25/2012   CO2 30 12/25/2012   Lab Results  Component Value Date   ALT 13 12/25/2012   AST 15 12/25/2012   ALKPHOS 54 12/25/2012   BILITOT 0.3 12/25/2012   Lab Results  Component Value Date   CHOL 188 06/16/2012   Lab Results  Component Value Date   HDL 54.30 06/16/2012   Lab Results  Component Value Date   LDLCALC 107* 06/16/2012  Lab Results  Component Value Date   TRIG 135.0 06/16/2012   Lab Results  Component Value Date   CHOLHDL 3 06/16/2012     Assessment & Plan   ANXIETY DEPRESSION Has recently had a nephew and a good friend die. The stress has been excessive at times. Will increase Lexapro to 20 mg daily and reassess.  Hyperglycemia hgba1c acceptable, minimize simple carbs. Increase exercise as tolerated.   Hyperlipidemia Encouraged heart healthy diet, increase exercise, avoid trans fats, consider a krill oil cap daily  ALLERGIC RHINITIS, SEASONAL Not controlled on Antihistamines. Will add Singulair.  Obesity Encouraged DASH diet, decrease po intake and increase exercise as tolerated. Needs 7-8 hours of sleep nightly. Avoid trans fats, eat small, frequent meals every 4-5 hours with lean proteins, complex carbs and healthy fats. Minimize simple carbs, GMO foods.  PERIMENOPAUSAL SYNDROME Encouraged good sleep hygiene such as dark, quiet room. No blue/green glowing lights such as computer screens in bedroom. No alcohol or stimulants in evening. Cut down on caffeine as able. Regular exercise is helpful but not just prior to bed time. Small, frequent meals with  Lean proteins and minimize simple carbs

## 2013-07-02 NOTE — Patient Instructions (Signed)

## 2013-07-02 NOTE — Telephone Encounter (Signed)
Relevant patient education mailed to patient.  

## 2013-07-02 NOTE — Progress Notes (Signed)
Pre visit review using our clinic review tool, if applicable. No additional management support is needed unless otherwise documented below in the visit note. 

## 2013-07-05 DIAGNOSIS — R739 Hyperglycemia, unspecified: Secondary | ICD-10-CM | POA: Insufficient documentation

## 2013-07-05 NOTE — Assessment & Plan Note (Signed)
Encouraged good sleep hygiene such as dark, quiet room. No blue/green glowing lights such as computer screens in bedroom. No alcohol or stimulants in evening. Cut down on caffeine as able. Regular exercise is helpful but not just prior to bed time. Small, frequent meals with  Lean proteins and minimize simple carbs

## 2013-07-05 NOTE — Assessment & Plan Note (Signed)
hgba1c acceptable, minimize simple carbs. Increase exercise as tolerated.  

## 2013-07-05 NOTE — Assessment & Plan Note (Signed)
Encouraged DASH diet, decrease po intake and increase exercise as tolerated. Needs 7-8 hours of sleep nightly. Avoid trans fats, eat small, frequent meals every 4-5 hours with lean proteins, complex carbs and healthy fats. Minimize simple carbs, GMO foods. 

## 2013-07-05 NOTE — Assessment & Plan Note (Signed)
Has recently had a nephew and a good friend die. The stress has been excessive at times. Will increase Lexapro to 20 mg daily and reassess.

## 2013-07-05 NOTE — Assessment & Plan Note (Signed)
Not controlled on Antihistamines. Will add Singulair.

## 2013-07-05 NOTE — Assessment & Plan Note (Signed)
Encouraged heart healthy diet, increase exercise, avoid trans fats, consider a krill oil cap daily 

## 2013-08-12 ENCOUNTER — Other Ambulatory Visit: Payer: Self-pay | Admitting: Family Medicine

## 2013-08-12 NOTE — Telephone Encounter (Signed)
Pt has f/u in July. Please advise.  Medication name:  Name from pharmacy:  clonazePAM (KLONOPIN) 0.5 MG tablet  CLONAZEPAM 0.5 MG TABLET Sig: TAKE 1 TABLET 3 TIMES A DAY AS NEEDED FOR ANXIETY Dispense: 90 tablet Refills: 0 Start: 08/12/2013 Class: Normal Requested on: 07/21/2013 Originally ordered on: 05/22/2010 Last refill: 07/21/2013

## 2013-08-13 ENCOUNTER — Other Ambulatory Visit: Payer: Self-pay | Admitting: Family Medicine

## 2013-11-03 ENCOUNTER — Encounter: Payer: Self-pay | Admitting: Family Medicine

## 2013-11-03 ENCOUNTER — Ambulatory Visit (INDEPENDENT_AMBULATORY_CARE_PROVIDER_SITE_OTHER): Payer: Managed Care, Other (non HMO) | Admitting: Family Medicine

## 2013-11-03 VITALS — BP 122/72 | HR 50 | Temp 98.7°F | Ht 66.75 in | Wt 187.1 lb

## 2013-11-03 DIAGNOSIS — Z1211 Encounter for screening for malignant neoplasm of colon: Secondary | ICD-10-CM

## 2013-11-03 DIAGNOSIS — Z0000 Encounter for general adult medical examination without abnormal findings: Secondary | ICD-10-CM

## 2013-11-03 DIAGNOSIS — E785 Hyperlipidemia, unspecified: Secondary | ICD-10-CM

## 2013-11-03 DIAGNOSIS — R739 Hyperglycemia, unspecified: Secondary | ICD-10-CM

## 2013-11-03 DIAGNOSIS — J301 Allergic rhinitis due to pollen: Secondary | ICD-10-CM

## 2013-11-03 DIAGNOSIS — L578 Other skin changes due to chronic exposure to nonionizing radiation: Secondary | ICD-10-CM

## 2013-11-03 DIAGNOSIS — F329 Major depressive disorder, single episode, unspecified: Secondary | ICD-10-CM

## 2013-11-03 DIAGNOSIS — R7309 Other abnormal glucose: Secondary | ICD-10-CM

## 2013-11-03 DIAGNOSIS — F17200 Nicotine dependence, unspecified, uncomplicated: Secondary | ICD-10-CM

## 2013-11-03 DIAGNOSIS — F419 Anxiety disorder, unspecified: Secondary | ICD-10-CM

## 2013-11-03 DIAGNOSIS — E669 Obesity, unspecified: Secondary | ICD-10-CM

## 2013-11-03 DIAGNOSIS — G4700 Insomnia, unspecified: Secondary | ICD-10-CM

## 2013-11-03 DIAGNOSIS — F32A Depression, unspecified: Secondary | ICD-10-CM

## 2013-11-03 DIAGNOSIS — F341 Dysthymic disorder: Secondary | ICD-10-CM

## 2013-11-03 LAB — CBC
HCT: 42.6 % (ref 36.0–46.0)
Hemoglobin: 14.9 g/dL (ref 12.0–15.0)
MCH: 33.1 pg (ref 26.0–34.0)
MCHC: 35 g/dL (ref 30.0–36.0)
MCV: 94.7 fL (ref 78.0–100.0)
PLATELETS: 253 10*3/uL (ref 150–400)
RBC: 4.5 MIL/uL (ref 3.87–5.11)
RDW: 13.5 % (ref 11.5–15.5)
WBC: 7.8 10*3/uL (ref 4.0–10.5)

## 2013-11-03 LAB — HEPATIC FUNCTION PANEL
ALBUMIN: 4.3 g/dL (ref 3.5–5.2)
ALT: 13 U/L (ref 0–35)
AST: 16 U/L (ref 0–37)
Alkaline Phosphatase: 56 U/L (ref 39–117)
BILIRUBIN TOTAL: 0.3 mg/dL (ref 0.2–1.2)
Bilirubin, Direct: 0.1 mg/dL (ref 0.0–0.3)
Indirect Bilirubin: 0.2 mg/dL (ref 0.2–1.2)
TOTAL PROTEIN: 6.8 g/dL (ref 6.0–8.3)

## 2013-11-03 LAB — HEMOGLOBIN A1C
Hgb A1c MFr Bld: 5.9 % — ABNORMAL HIGH (ref <5.7)
MEAN PLASMA GLUCOSE: 123 mg/dL — AB (ref <117)

## 2013-11-03 LAB — RENAL FUNCTION PANEL
ALBUMIN: 4.3 g/dL (ref 3.5–5.2)
BUN: 11 mg/dL (ref 6–23)
CALCIUM: 9.3 mg/dL (ref 8.4–10.5)
CHLORIDE: 103 meq/L (ref 96–112)
CO2: 28 mEq/L (ref 19–32)
CREATININE: 0.8 mg/dL (ref 0.50–1.10)
Glucose, Bld: 85 mg/dL (ref 70–99)
PHOSPHORUS: 4.1 mg/dL (ref 2.3–4.6)
Potassium: 3.9 mEq/L (ref 3.5–5.3)
Sodium: 138 mEq/L (ref 135–145)

## 2013-11-03 LAB — TSH: TSH: 1.566 u[IU]/mL (ref 0.350–4.500)

## 2013-11-03 MED ORDER — ZOLPIDEM TARTRATE 10 MG PO TABS: 10.0000 mg | ORAL_TABLET | Freq: Every evening | ORAL | Status: DC | PRN

## 2013-11-03 MED ORDER — CLONAZEPAM 0.5 MG PO TABS: 0.5000 mg | ORAL_TABLET | Freq: Three times a day (TID) | ORAL | Status: DC | PRN

## 2013-11-03 MED ORDER — ESCITALOPRAM OXALATE 20 MG PO TABS: 40.0000 mg | ORAL_TABLET | Freq: Every day | ORAL | Status: DC

## 2013-11-03 NOTE — Progress Notes (Signed)
Pre visit review using our clinic review tool, if applicable. No additional management support is needed unless otherwise documented below in the visit note. 

## 2013-11-03 NOTE — Progress Notes (Signed)
Patient ID: Andrea Bowman, female   DOB: 1961-01-16, 53 y.o.   MRN: [id number] Andrea Bowman [id number] 04/05/1961 11/03/2013      Progress Note-Follow Up  Subjective  Chief Complaint  Chief Complaint  Patient presents with  . Annual Exam    physical    HPI  Patient is a 53 year old femal in today for routine medical care. She is in today for annual exam. She is willing to proceed with labs had a donut and coffee with cream and sugar about 6 hours prior to visit. No recent illness. Lexapro is working fairly well. Notes a lesion on right leg has been growing. No bleeding, itching or pain Denies CP/palp/SOB/HA/congestion/fevers/GI or GU c/o. Taking meds as prescribed  Past Medical History  Diagnosis Date  . PVC (premature ventricular contraction)     takes Atenolol   . Anxiety     panic attacks  . Depression   . 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 Osteoarthritis of knee     right  . 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 Perimenopausal   . Dental infection     will finish antibiotic 05/01/2012  . Complication of anesthesia     hard to wake up post-op  . Dental bridge present     upper  . Acute bronchitis 08/24/2010    Past Surgical History  Procedure Laterality Date  . Cholecystectomy  2007  . Knee arthroscopy with lateral release  05/02/2012    Procedure: KNEE ARTHROSCOPY WITH LATERAL RELEASE;  Surgeon: Harvie Junior, MD;  Location: [facility];  Service: Orthopedics;  Laterality: Right;  chondroplasty with lateral release    Family History  Problem Relation Age of Onset  . Cancer Mother     Breast  . Hypertension Father   . Heart failure Father     CHF  . Depression Sister     previous history of  . Anxiety disorder Sister     Previous history of  . Depression Brother   . Anxiety disorder Brother   . Ulcers Brother     PUD  . Cancer Maternal Aunt     X several w/ breast cancer, various ages  .  Cancer Paternal Uncle     X several w/ prostate cancer  . Cancer Maternal Grandmother     History   Social History  . Marital Status: Married    Spouse Name: N/A    Number of Children: N/A  . Years of Education: N/A   Occupational History  . Not on file.   Social History Main Topics  . Smoking status: Current Every Day Smoker -- 30 years    Types: Cigarettes  . Smokeless tobacco: Never Used     Comment: 3 cig./day and several e-cigarettes/day  . Alcohol Use: Yes     Comment: seldom  . Drug Use: No  . Sexual Activity: Yes    Partners: Male     Comment: is separted from husband, he is out of the country and seeing someone else   Other Topics Concern  . Not on file   Social History Narrative  . No narrative on file    Current Outpatient Prescriptions on File Prior to Visit  Medication Sig Dispense Refill  . albuterol (PROVENTIL HFA;VENTOLIN HFA) 108 (90 BASE) MCG/ACT inhaler Inhale 2 puffs into the lungs every 6 (six) hours as needed for wheezing or shortness of breath.  1 Inhaler  2  .  atenolol (TENORMIN) 25 MG tablet Take 1 tablet (25 mg total) by mouth 2 (two) times daily.  180 tablet  1  . clonazePAM (KLONOPIN) 0.5 MG tablet TAKE 1 TABLET 3 TIMES A DAY AS NEEDED FOR ANXIETY  90 tablet  0  . escitalopram (LEXAPRO) 20 MG tablet TAKE 1 TABLET (20 MG TOTAL) BY MOUTH DAILY.  90 tablet  1  . ibuprofen (ADVIL,MOTRIN) 200 MG tablet Take 600 mg by mouth every 6 (six) hours as needed for pain.      . montelukast (SINGULAIR) 10 MG tablet Take 1 tablet (10 mg total) by mouth at bedtime.  30 tablet  1  . nitroGLYCERIN (NITROSTAT) 0.4 MG SL tablet Place 1 tablet (0.4 mg total) under the tongue every 5 (five) minutes as needed for chest pain.  25 tablet  3   No current facility-administered medications on file prior to visit.    Allergies  Allergen Reactions  . Prednisone Other (See Comments)    REACTION: REDNESS OF FACE    Review of Systems  Review of Systems   Constitutional: Negative for fever and malaise/fatigue.  HENT: Negative for congestion.   Eyes: Negative for discharge.  Respiratory: Negative for shortness of breath.   Cardiovascular: Negative for chest pain, palpitations and leg swelling.  Gastrointestinal: Negative for nausea, abdominal pain and diarrhea.  Genitourinary: Negative for dysuria.  Musculoskeletal: Negative for falls.  Skin: Negative for rash.  Neurological: Negative for loss of consciousness and headaches.  Endo/Heme/Allergies: Negative for polydipsia.  Psychiatric/Behavioral: Negative for depression and suicidal ideas. The patient is not nervous/anxious and does not have insomnia.     Objective  BP 122/72  Pulse 50  Temp(Src) 98.7 F (37.1 C) (Oral)  Ht 5' 6.75" (1.695 m)  Wt 187 lb 1.3 oz (84.859 kg)  BMI 29.54 kg/m2  SpO2 97%  Physical Exam  Physical Exam  Constitutional: She is oriented to person, place, and time and well-developed, well-nourished, and in no distress. No distress.  HENT:  Head: Normocephalic and atraumatic.  Eyes: Conjunctivae are normal.  Neck: Neck supple. No thyromegaly present.  Cardiovascular: Normal rate, regular rhythm and normal heart sounds.   No murmur heard. Pulmonary/Chest: Effort normal and breath sounds normal. She has no wheezes.  Abdominal: She exhibits no distension and no mass.  Musculoskeletal: She exhibits no edema.  Lymphadenopathy:    She has no cervical adenopathy.  Neurological: She is alert and oriented to person, place, and time.  Skin: Skin is warm and dry. No rash noted. She is not diaphoretic.  Psychiatric: Memory, affect and judgment normal.    Lab Results  Component Value Date   TSH 0.942 12/25/2012   Lab Results  Component Value Date   WBC 8.2 12/25/2012   HGB 15.3* 12/25/2012   HCT 43.8 12/25/2012   MCV 94.6 12/25/2012   PLT 240 12/25/2012   Lab Results  Component Value Date   CREATININE 0.70 07/02/2013   BUN 12 07/02/2013   NA 143 07/02/2013    K 4.2 07/02/2013   CL 106 07/02/2013   CO2 28 07/02/2013   Lab Results  Component Value Date   ALT 13 12/25/2012   AST 15 12/25/2012   ALKPHOS 54 12/25/2012   BILITOT 0.3 12/25/2012   Lab Results  Component Value Date   CHOL 188 06/16/2012   Lab Results  Component Value Date   HDL 54.30 06/16/2012   Lab Results  Component Value Date   LDLCALC 107* 06/16/2012   Lab  Results  Component Value Date   TRIG 135.0 06/16/2012   Lab Results  Component Value Date   CHOLHDL 3 06/16/2012     Assessment & Plan  ALLERGIC RHINITIS, SEASONAL Good response to meds, no changes  TOBACCO ABUSE Encouraged complete cessation. Discussed need to quit as relates to risk of numerous cancers, cardiac and pulmonary disease as well as neurologic complications. Counseled for greater than 3 minutes  Preventative health care Patient encouraged to maintain heart healthy diet, regular exercise, adequate sleep. Consider daily probiotics. Take medications as prescribed. Performed annual labs today. [facility] Sees [facility] for paps and has MGMs in [place]  ANXIETY DEPRESSION Adequate response to Lexapro  Hyperglycemia hgba1c acceptable, minimize simple carbs. Increase exercise as tolerated.   Obesity Encouraged DASH diet, decrease po intake and increase exercise as tolerated. Needs 7-8 hours of sleep nightly. Avoid trans fats, eat small, frequent meals every 4-5 hours with lean proteins, complex carbs and healthy fats. Minimize simple carbs, GMO foods.

## 2013-11-03 NOTE — Assessment & Plan Note (Signed)
Good response to meds, no changes

## 2013-11-03 NOTE — Assessment & Plan Note (Signed)
Encouraged complete cessation. Discussed need to quit as relates to risk of numerous cancers, cardiac and pulmonary disease as well as neurologic complications. Counseled for greater than 3 minutes 

## 2013-11-03 NOTE — Patient Instructions (Signed)
Insomnia Insomnia is frequent trouble falling and/or staying asleep. Insomnia can be a long term problem or a short term problem. Both are common. Insomnia can be a short term problem when the wakefulness is related to a certain stress or worry. Long term insomnia is often related to ongoing stress during waking hours and/or poor sleeping habits. Overtime, sleep deprivation itself can make the problem worse. Every little thing feels more severe because you are overtired and your ability to cope is decreased. CAUSES   Stress, anxiety, and depression.  Poor sleeping habits.  Distractions such as TV in the bedroom.  Naps close to bedtime.  Engaging in emotionally charged conversations before bed.  Technical reading before sleep.  Alcohol and other sedatives. They may make the problem worse. They can hurt normal sleep patterns and normal dream activity.  Stimulants such as caffeine for several hours prior to bedtime.  Pain syndromes and shortness of breath can cause insomnia.  Exercise late at night.  Changing time zones may cause sleeping problems (jet lag). It is sometimes helpful to have someone observe your sleeping patterns. They should look for periods of not breathing during the night (sleep apnea). They should also look to see how long those periods last. If you live alone or observers are uncertain, you can also be observed at a sleep clinic where your sleep patterns will be professionally monitored. Sleep apnea requires a checkup and treatment. Give your caregivers your medical history. Give your caregivers observations your family has made about your sleep.  SYMPTOMS   Not feeling rested in the morning.  Anxiety and restlessness at bedtime.  Difficulty falling and staying asleep. TREATMENT   Your caregiver may prescribe treatment for an underlying medical disorders. Your caregiver can give advice or help if you are using alcohol or other drugs for self-medication. Treatment  of underlying problems will usually eliminate insomnia problems.  Medications can be prescribed for short time use. They are generally not recommended for lengthy use.  Over-the-counter sleep medicines are not recommended for lengthy use. They can be habit forming.  You can promote easier sleeping by making lifestyle changes such as:  Using relaxation techniques that help with breathing and reduce muscle tension.  Exercising earlier in the day.  Changing your diet and the time of your last meal. No night time snacks.  Establish a regular time to go to bed.  Counseling can help with stressful problems and worry.  Soothing music and white noise may be helpful if there are background noises you cannot remove.  Stop tedious detailed work at least one hour before bedtime. HOME CARE INSTRUCTIONS   Keep a diary. Inform your caregiver about your progress. This includes any medication side effects. See your caregiver regularly. Take note of:  Times when you are asleep.  Times when you are awake during the night.  The quality of your sleep.  How you feel the next day. This information will help your caregiver care for you.  Get out of bed if you are still awake after 15 minutes. Read or do some quiet activity. Keep the lights down. Wait until you feel sleepy and go back to bed.  Keep regular sleeping and waking hours. Avoid naps.  Exercise regularly.  Avoid distractions at bedtime. Distractions include watching television or engaging in any intense or detailed activity like attempting to balance the household checkbook.  Develop a bedtime ritual. Keep a familiar routine of bathing, brushing your teeth, climbing into bed at the same   time each night, listening to soothing music. Routines increase the success of falling to sleep faster.  Use relaxation techniques. This can be using breathing and muscle tension release routines. It can also include visualizing peaceful scenes. You can  also help control troubling or intruding thoughts by keeping your mind occupied with boring or repetitive thoughts like the old concept of counting sheep. You can make it more creative like imagining planting one beautiful flower after another in your backyard garden.  During your day, work to eliminate stress. When this is not possible use some of the previous suggestions to help reduce the anxiety that accompanies stressful situations. MAKE SURE YOU:   Understand these instructions.  Will watch your condition.  Will get help right away if you are not doing well or get worse. Document Released: 03/23/2000 Document Revised: 06/18/2011 Document Reviewed: 04/23/2007 Exit[employer] Patient Information 2015 [employer]. This information is not intended to replace advice given to you by your health care provider. Make sure you discuss any questions you have with your health care provider.  

## 2013-11-05 ENCOUNTER — Encounter: Payer: Self-pay | Admitting: Family Medicine

## 2013-11-07 ENCOUNTER — Other Ambulatory Visit: Payer: Self-pay | Admitting: Family Medicine

## 2013-11-08 NOTE — Assessment & Plan Note (Signed)
hgba1c acceptable, minimize simple carbs. Increase exercise as tolerated.  

## 2013-11-08 NOTE — Assessment & Plan Note (Addendum)
Patient encouraged to maintain heart healthy diet, regular exercise, adequate sleep. Consider daily probiotics. Take medications as prescribed. Performed annual labs today. [facility] Sees [facility] for paps and has MGMs in [place]

## 2013-11-08 NOTE — Assessment & Plan Note (Signed)
Encouraged DASH diet, decrease po intake and increase exercise as tolerated. Needs 7-8 hours of sleep nightly. Avoid trans fats, eat small, frequent meals every 4-5 hours with lean proteins, complex carbs and healthy fats. Minimize simple carbs, GMO foods. 

## 2013-11-08 NOTE — Assessment & Plan Note (Signed)
Adequate response to Lexapro

## 2013-12-01 ENCOUNTER — Telehealth: Payer: Self-pay | Admitting: Family Medicine

## 2013-12-01 DIAGNOSIS — F32A Depression, unspecified: Secondary | ICD-10-CM

## 2013-12-01 DIAGNOSIS — F419 Anxiety disorder, unspecified: Principal | ICD-10-CM

## 2013-12-01 DIAGNOSIS — F329 Major depressive disorder, single episode, unspecified: Secondary | ICD-10-CM

## 2013-12-01 NOTE — Telephone Encounter (Signed)
Refill- escitalopram  [facility] [id number]

## 2013-12-08 ENCOUNTER — Encounter: Payer: Self-pay | Admitting: Family Medicine

## 2013-12-10 ENCOUNTER — Ambulatory Visit: Payer: Managed Care, Other (non HMO) | Admitting: Licensed Clinical Social Worker

## 2013-12-29 ENCOUNTER — Ambulatory Visit: Payer: Managed Care, Other (non HMO) | Admitting: Family Medicine

## 2014-05-04 ENCOUNTER — Other Ambulatory Visit: Payer: Self-pay | Admitting: Family Medicine

## 2014-05-04 NOTE — Telephone Encounter (Signed)
Last filled: 11/09/13 Amt: 180, 1 refill Last OV: 11/03/13  Med filled x 1 month; Need OV.

## 2014-05-17 ENCOUNTER — Other Ambulatory Visit: Payer: Self-pay | Admitting: Family Medicine

## 2014-05-17 NOTE — Telephone Encounter (Signed)
Patient has been informed of PCP instructions regarding refill.   The patient stated once she has insurance she will call to schedule an appointment.. Faxed hardcopy [facility] [place].

## 2014-05-17 NOTE — Telephone Encounter (Signed)
I have approved just 10 tabs, it has been longer than 6 months since she was in and so to get a refill she needs to come in just let her know.

## 2014-05-20 ENCOUNTER — Telehealth: Payer: Self-pay | Admitting: Family Medicine

## 2014-05-20 NOTE — Telephone Encounter (Signed)
Caller name: Raynelle FanningJulie Relation to pt: self  Call back number: [phone] [facility]  Reason for call:   Patient requesting enough med refill to last her until her appointment 06/28/14(first available). Clonopin.

## 2014-05-20 NOTE — Telephone Encounter (Signed)
OK to refill Clonazepam same strength, same sig #90 til seen, no further refills

## 2014-05-21 ENCOUNTER — Ambulatory Visit: Payer: Managed Care, Other (non HMO) | Admitting: Physician Assistant

## 2014-05-21 MED ORDER — CLONAZEPAM 0.5 MG PO TABS: ORAL_TABLET | ORAL | Status: DC

## 2014-05-21 NOTE — Telephone Encounter (Signed)
Printed prescription as PCP instructed. Called the patient informed [facility] and will be enough to last until appt. In March.

## 2014-05-25 ENCOUNTER — Ambulatory Visit: Payer: Managed Care, Other (non HMO) | Admitting: Family Medicine

## 2014-06-13 ENCOUNTER — Other Ambulatory Visit: Payer: Self-pay | Admitting: Family Medicine

## 2014-06-28 ENCOUNTER — Ambulatory Visit: Payer: Self-pay | Admitting: Family Medicine

## 2014-06-28 ENCOUNTER — Ambulatory Visit: Payer: Managed Care, Other (non HMO) | Admitting: Family Medicine

## 2014-06-28 ENCOUNTER — Encounter: Payer: Self-pay | Admitting: Family Medicine

## 2014-07-07 ENCOUNTER — Encounter: Payer: Self-pay | Admitting: Family Medicine

## 2014-07-07 ENCOUNTER — Telehealth: Payer: Self-pay | Admitting: Family Medicine

## 2014-07-07 NOTE — Telephone Encounter (Signed)
Pt was no show for office visit on 06/28/14- letter sent- charge?

## 2014-07-07 NOTE — Telephone Encounter (Signed)
Yes charge 

## 2014-07-09 ENCOUNTER — Other Ambulatory Visit: Payer: Self-pay | Admitting: Family Medicine

## 2014-07-09 DIAGNOSIS — Z139 Encounter for screening, unspecified: Secondary | ICD-10-CM

## 2014-07-15 ENCOUNTER — Other Ambulatory Visit: Payer: Self-pay | Admitting: Nurse Practitioner

## 2014-07-15 ENCOUNTER — Ambulatory Visit (INDEPENDENT_AMBULATORY_CARE_PROVIDER_SITE_OTHER): Payer: BLUE CROSS/BLUE SHIELD

## 2014-07-15 DIAGNOSIS — Z1231 Encounter for screening mammogram for malignant neoplasm of breast: Secondary | ICD-10-CM | POA: Diagnosis not present

## 2014-07-15 DIAGNOSIS — Z139 Encounter for screening, unspecified: Secondary | ICD-10-CM

## 2014-07-20 ENCOUNTER — Ambulatory Visit (INDEPENDENT_AMBULATORY_CARE_PROVIDER_SITE_OTHER): Payer: BLUE CROSS/BLUE SHIELD | Admitting: Nurse Practitioner

## 2014-07-20 ENCOUNTER — Encounter: Payer: Self-pay | Admitting: Nurse Practitioner

## 2014-07-20 VITALS — BP 126/76 | HR 53 | Temp 98.0°F | Ht 66.75 in | Wt 189.0 lb

## 2014-07-20 DIAGNOSIS — M25519 Pain in unspecified shoulder: Secondary | ICD-10-CM | POA: Insufficient documentation

## 2014-07-20 DIAGNOSIS — B070 Plantar wart: Secondary | ICD-10-CM | POA: Diagnosis not present

## 2014-07-20 DIAGNOSIS — M25511 Pain in right shoulder: Secondary | ICD-10-CM | POA: Diagnosis not present

## 2014-07-20 NOTE — Progress Notes (Signed)
Pre visit review using our clinic review tool, if applicable. No additional management support is needed unless otherwise documented below in the visit note. 

## 2014-07-20 NOTE — Progress Notes (Signed)
Subjective:     Andrea Bowman is a 54 y.o. female presents w/c/o plantar wart & R arm pain. She is new pt to me. plantar wart: noticed 2 weeks ago. Tender, L foot.  R arm pain: recurrent. Saw ortho in [place] about 9 yrs ago. Had "shoulder injection" with relief of pain. Pain returned about 2 mos ago. Pain worse when lays on shoulder & increased activity. Located "top of shoulder" & radiates to elbow & hand when severe. Pain relieved w/rest & relaxing arm in overhead position. 600 mg ibuprophen bid provides minimal relief. Pain intereres with activity. Denies weakness & decreased ROM. Pt is due for colonoscopy: discussed various methods of colon cancer screening. Pt states she has made 2 appts for colonoscopy, but cancelled both due to fear. Discussed ifob test. Pt will consider. Strong fam Hx of breast cancer.  The following portions of the patient's history were reviewed and updated as appropriate: allergies, current medications, past family history, past medical history, past social history, past surgical history and problem list.  Review of Systems Pertinent items are noted in HPI.    Objective:    BP 126/76 mmHg  Pulse 53  Temp(Src) 98 F (36.7 C) (Oral)  Ht 5' 6.75" (1.695 m)  Wt 189 lb (85.73 kg)  BMI 29.84 kg/m2  SpO2 98% BP 126/76 mmHg  Pulse 53  Temp(Src) 98 F (36.7 C) (Oral)  Ht 5' 6.75" (1.695 m)  Wt 189 lb (85.73 kg)  BMI 29.84 kg/m2  SpO2 98% General appearance: alert, cooperative, appears stated age and no distress Head: Normocephalic, without obvious abnormality, atraumatic Eyes: negative findings: lids and lashes normal and conjunctivae and sclerae normal Extremities: r shoulder:FROM, no obvious deformity. [facility] & posterior joint. Tender over deltoid. Skin: L foot-Pea sized wart bottom foot Neurologic: Grossly normal    Procedure: Freeze wart. Bottom L foot. Lesion cleaned w/alcohol swab X 2. Histofreeze applied per manufacture instructions for wart  on bottom of foot. Skin blanched followed by erythema within few seconds. Band aid applied. Pt tolerated well.   Assessment:Plan     1. Acromioclavicular joint pain, right DD: proximal biceps brachii tendonitis - Ambulatory referral to Orthopedics  2. Plantar wart, left foot histofreeze See pt instructions for skin care F/u 2 wks

## 2014-07-20 NOTE — Patient Instructions (Signed)
Keep wart clean. You may have blister formation. Its ok if it pops, but keep covered with vaseline & band aid. Please see orthopedist. In the meantime, apply aspercream over aching area followed by moist heat 3 times daily.  Start turmeric 1000 to 2000 mg daily for inflammation. Let me know how you would like to proceed for colon cancer screening when you return in 2 weeks.  Nice to meet you!   Plantar Warts Warts are benign (noncancerous) growths of the outer skin layer. They can occur at any time in life but are most common during childhood and the teen years. Warts can occur on many skin surfaces of the body. When they occur on the underside (sole) of your foot they are called plantar warts. They often emerge in groups with several small warts encircling a larger growth. CAUSES  Human papillomavirus (HPV) is the cause of plantar warts. HPV attacks a break in the skin of the foot. Walking barefoot can lead to exposure to the wart virus. Plantar warts tend to develop over areas of pressure such as the heel and ball of the foot. Plantar warts often grow into the deeper layers of skin. They may spread to other areas of the sole but cannot spread to other areas of the body. SYMPTOMS  You may also notice a growth on the undersurface of your foot. The wart may grow directly into the sole of the foot, or rise above the surface of the skin on the sole of the foot, or both. They are most often flat from pressure. Warts generally do not cause itching but may cause pain in the area of the wart when you put weight on your foot. DIAGNOSIS  Diagnosis is made by physical examination. This means your caregiver discovers it while examining your foot.  TREATMENT  There are many ways to treat plantar warts. However, warts are very tough. Sometimes it is difficult to treat them so that they go away completely and do not grow back. Any treatment must be done regularly to work. If left untreated, most plantar warts will  eventually disappear over a period of one to two years. Treatments you can do at home include:  Putting duct tape over the top of the wart (occlusion) has been found to be effective over several months. The duct tape should be removed each night and reapplied until the wart has disappeared.  Placing over-the-counter medications on top of the wart to help kill the wart virus and remove the wart tissue (salicylic acid, cantharidin, and dichloroacetic acid) are useful. These are called keratolytic agents. These medications make the skin soft and gradually layers will shed away. These compounds are usually placed on the wart each night and then covered with a bandage. They are also available in premedicated bandage form. Avoid surrounding skin when applying these liquids as these medications can burn healthy skin. The treatment may take several months of nightly use to be effective.  Cryotherapy to freeze the wart has recently become available over-the-counter for children 4 years and older. This system makes use of a soft narrow applicator connected to a bottle of compressed cold liquid that is applied directly to the wart. This medication can burn healthy skin and should be used with caution.  As with all over-the-counter medications, read the directions carefully before use. Treatments generally done in your caregiver's office include:  Some aggressive treatments may cause discomfort, discoloration, and scarring of the surrounding skin. The risks and benefits of treatment should  be discussed with your caregiver.  Freezing the wart with liquid nitrogen (cryotherapy, see above).  Burning the wart with use of very high heat (cautery).  Injecting medication into the wart.  Surgically removing or laser treatment of the wart.  Your caregiver may refer you to a dermatologist for difficult to treat large-sized warts or large numbers of warts. HOME CARE INSTRUCTIONS   Soak the affected area in warm  water. Dry the area completely when you are done. Remove the top layer of softened skin, then apply the chosen topical medication and reapply a bandage.  Remove the bandage daily and file excess wart tissue (pumice stone works well for this purpose). Repeat the entire process daily or every other day for weeks until the plantar wart disappears.  Several brands of salicylic acid pads are available as over-the-counter remedies.  Pain can be relieved by wearing a donut bandage. This is a bandage with a hole in it. The bandage is put on with the hole over the wart. This helps take the pressure off the wart and gives pain relief. To help prevent plantar warts:  Wear shoes and socks and change them daily.  Keep feet clean and dry.  Check your feet and your children's feet regularly.  Avoid direct contact with warts on other people.  Have growths or changes on your skin checked by your caregiver. Document Released: 06/16/2003 Document Revised: 08/10/2013 Document Reviewed: 11/24/2008 [facility] Patient Information 2015 [place]. This information is not intended to replace advice given to you by your health care provider. Make sure you discuss any questions you have with your health care provider.

## 2014-08-03 ENCOUNTER — Ambulatory Visit: Payer: BLUE CROSS/BLUE SHIELD | Admitting: Nurse Practitioner

## 2014-08-09 ENCOUNTER — Ambulatory Visit (INDEPENDENT_AMBULATORY_CARE_PROVIDER_SITE_OTHER): Payer: BLUE CROSS/BLUE SHIELD | Admitting: Nurse Practitioner

## 2014-08-09 ENCOUNTER — Encounter: Payer: Self-pay | Admitting: Nurse Practitioner

## 2014-08-09 VITALS — BP 122/76 | HR 58 | Temp 97.9°F | Ht 66.75 in | Wt 194.0 lb

## 2014-08-09 DIAGNOSIS — F410 Panic disorder [episodic paroxysmal anxiety] without agoraphobia: Secondary | ICD-10-CM | POA: Diagnosis not present

## 2014-08-09 DIAGNOSIS — B070 Plantar wart: Secondary | ICD-10-CM | POA: Diagnosis not present

## 2014-08-09 DIAGNOSIS — Z0000 Encounter for general adult medical examination without abnormal findings: Secondary | ICD-10-CM | POA: Diagnosis not present

## 2014-08-09 MED ORDER — CLONAZEPAM 0.5 MG PO TABS: ORAL_TABLET | ORAL | Status: DC

## 2014-08-09 NOTE — Progress Notes (Signed)
Pre visit review using our clinic review tool, if applicable. No additional management support is needed unless otherwise documented below in the visit note. 

## 2014-08-09 NOTE — Progress Notes (Signed)
Subjective:     Andrea Bowman is a 54 y.o. female : f/u L plantar wart, reqst refill clonazepam.  L plantar wart: histofreeze application almost 3 weeks ago. Returns today for eval of 2nd treatment. Denies pain at site. reqst refill clonazepam: pt reports depression & anxiety for many yrs. Current Tx lexapro & clonazepam. She reports this combo works "pretty well". She takes clonazepam twice daily.  Last "panic attack was about 2 mos ago. Worked with therapist in past. Does not want to do this now as time is limited: her sister just dx'd w/breast ca. Pt will help her as she goes through treatments.  Preve care:  Will ret for fasting labs & CPE., needs colo ca screen, discussed colonoscopy vs stool testing.  The following portions of the patient's history were reviewed and updated as appropriate: allergies, current medications, past family history, past medical history, past social history, past surgical history and problem list.  Review of Systems Behavioral/Psych: negative for sleep disturbance    Objective:    BP 122/76 mmHg  Pulse 58  Temp(Src) 97.9 F (36.6 C) (Oral)  Ht 5' 6.75" (1.695 m)  Wt 194 lb (87.998 kg)  BMI 30.63 kg/m2  SpO2 98% BP 122/76 mmHg  Pulse 58  Temp(Src) 97.9 F (36.6 C) (Oral)  Ht 5' 6.75" (1.695 m)  Wt 194 lb (87.998 kg)  BMI 30.63 kg/m2  SpO2 98% General appearance: alert, cooperative, appears stated age and no distress Head: Normocephalic, without obvious abnormality, atraumatic Eyes: negative findings: lids and lashes normal and conjunctivae and sclerae normal Extremities: extremities normal, atraumatic, no cyanosis or edema Skin: L plantar wart, 2-3 mm lesion at surface, palpate 6-8 mm lesion under skin, warty appearance, no signs of infection Neurologic: Grossly normal    Assessment:Plan     1. Panic anxiety syndrome Long standing, no changes, needs CMET before refilling again - clonazePAM (KLONOPIN) 0.5 MG tablet; TAKE 1 TABLET BY MOUTH 3  TIMES A DAY AS NEEDED FOR ANXIETY  Dispense: 90 tablet; Refill: 0  2. Preventative health care Will ret for fasting labs & CPE - CBC with Differential/Platelet; Future - Comprehensive metabolic panel; Future - Vit D  25 hydroxy (rtn osteoporosis monitoring); Future - Vitamin B12; Future - TSH; Future - Hemoglobin A1c; Future - HIV antibody; Future - Lipid panel; Future  3. Plantar wart, left foot 2nd application histofreeze today.  Start applying salicylic acid daily in 1 week if no blister or after blister resolves.  Will ref to derm if no inprovement. See pt instructions

## 2014-08-09 NOTE — Patient Instructions (Signed)
Please return for fasting labs & for office visit to discuss labs & perform physical.  In 1 week, if there is no blister on foot, begin salicylic acid therapy. Apply daily for 1 month. Alternatively, I can refer you to dermatology for wart removal. Keep clean. Wash daily. Apply vaseline & cover with band aid.

## 2014-08-09 NOTE — Assessment & Plan Note (Signed)
No change after 1st treatment of histofreeze 2nd treatment today In 1 weeks, if no blister, Use OTC salicylic acid daily.  Ref to derm if no improvement.

## 2014-11-05 ENCOUNTER — Encounter: Payer: Self-pay | Admitting: Family Medicine

## 2014-11-05 ENCOUNTER — Ambulatory Visit (INDEPENDENT_AMBULATORY_CARE_PROVIDER_SITE_OTHER): Payer: BLUE CROSS/BLUE SHIELD | Admitting: Family Medicine

## 2014-11-05 VITALS — BP 132/76 | HR 61 | Temp 97.6°F | Resp 18 | Ht 67.0 in | Wt 197.0 lb

## 2014-11-05 DIAGNOSIS — F17200 Nicotine dependence, unspecified, uncomplicated: Secondary | ICD-10-CM

## 2014-11-05 DIAGNOSIS — F410 Panic disorder [episodic paroxysmal anxiety] without agoraphobia: Secondary | ICD-10-CM

## 2014-11-05 DIAGNOSIS — E782 Mixed hyperlipidemia: Secondary | ICD-10-CM | POA: Diagnosis not present

## 2014-11-05 DIAGNOSIS — G4700 Insomnia, unspecified: Secondary | ICD-10-CM

## 2014-11-05 DIAGNOSIS — I10 Essential (primary) hypertension: Secondary | ICD-10-CM | POA: Diagnosis not present

## 2014-11-05 DIAGNOSIS — R739 Hyperglycemia, unspecified: Secondary | ICD-10-CM | POA: Diagnosis not present

## 2014-11-05 DIAGNOSIS — F341 Dysthymic disorder: Secondary | ICD-10-CM

## 2014-11-05 DIAGNOSIS — Z720 Tobacco use: Secondary | ICD-10-CM

## 2014-11-05 DIAGNOSIS — E669 Obesity, unspecified: Secondary | ICD-10-CM

## 2014-11-05 DIAGNOSIS — E785 Hyperlipidemia, unspecified: Secondary | ICD-10-CM

## 2014-11-05 LAB — COMPREHENSIVE METABOLIC PANEL
ALT: 16 U/L (ref 0–35)
AST: 16 U/L (ref 0–37)
Albumin: 3.9 g/dL (ref 3.5–5.2)
Alkaline Phosphatase: 50 U/L (ref 39–117)
BUN: 13 mg/dL (ref 6–23)
CHLORIDE: 106 meq/L (ref 96–112)
CO2: 29 meq/L (ref 19–32)
CREATININE: 0.79 mg/dL (ref 0.40–1.20)
Calcium: 9.2 mg/dL (ref 8.4–10.5)
GFR: 79.98 mL/min (ref >60.00)
Glucose, Bld: 119 mg/dL — ABNORMAL HIGH (ref 70–99)
POTASSIUM: 3.8 meq/L (ref 3.5–5.1)
SODIUM: 140 meq/L (ref 135–145)
Total Bilirubin: 0.3 mg/dL (ref 0.2–1.2)
Total Protein: 6.7 g/dL (ref 6.0–8.3)

## 2014-11-05 LAB — LIPID PANEL
CHOL/HDL RATIO: 3
Cholesterol: 191 mg/dL (ref 0–200)
HDL: 56.4 mg/dL (ref >39.00)
LDL CALC: 108 mg/dL — AB (ref 0–99)
NonHDL: 134.8
Triglycerides: 135 mg/dL (ref 0.0–149.0)
VLDL: 27 mg/dL (ref 0.0–40.0)

## 2014-11-05 LAB — CBC
HCT: 42.4 % (ref 36.0–46.0)
Hemoglobin: 14.3 g/dL (ref 12.0–15.0)
MCHC: 33.7 g/dL (ref 30.0–36.0)
MCV: 97.4 fl (ref 78.0–100.0)
Platelets: 248 10*3/uL (ref 150.0–400.0)
RBC: 4.36 Mil/uL (ref 3.87–5.11)
RDW: 13.4 % (ref 11.5–15.5)
WBC: 9.2 10*3/uL (ref 4.0–10.5)

## 2014-11-05 LAB — HEMOGLOBIN A1C: HEMOGLOBIN A1C: 5.8 % (ref 4.6–6.5)

## 2014-11-05 LAB — TSH: TSH: 1.3 u[IU]/mL (ref 0.35–4.50)

## 2014-11-05 MED ORDER — SUVOREXANT 5 MG PO TABS: 5.0000 mg | ORAL_TABLET | Freq: Every day | ORAL | Status: DC

## 2014-11-05 MED ORDER — ALBUTEROL SULFATE HFA 108 (90 BASE) MCG/ACT IN AERS: 2.0000 | INHALATION_SPRAY | Freq: Four times a day (QID) | RESPIRATORY_TRACT | Status: DC | PRN

## 2014-11-05 MED ORDER — CLONAZEPAM 0.5 MG PO TABS: ORAL_TABLET | ORAL | Status: DC

## 2014-11-05 NOTE — Progress Notes (Signed)
Pre visit review using our clinic review tool, if applicable. No additional management support is needed unless otherwise documented below in the visit note. 

## 2014-11-05 NOTE — Patient Instructions (Signed)
Consider L Tryptophan capsules can help sleep also  Insomnia Insomnia is frequent trouble falling and/or staying asleep. Insomnia can be a long term problem or a short term problem. Both are common. Insomnia can be a short term problem when the wakefulness is related to a certain stress or worry. Long term insomnia is often related to ongoing stress during waking hours and/or poor sleeping habits. Overtime, sleep deprivation itself can make the problem worse. Every little thing feels more severe because you are overtired and your ability to cope is decreased. CAUSES   Stress, anxiety, and depression.  Poor sleeping habits.  Distractions such as TV in the bedroom.  Naps close to bedtime.  Engaging in emotionally charged conversations before bed.  Technical reading before sleep.  Alcohol and other sedatives. They may make the problem worse. They can hurt normal sleep patterns and normal dream activity.  Stimulants such as caffeine for several hours prior to bedtime.  Pain syndromes and shortness of breath can cause insomnia.  Exercise late at night.  Changing time zones may cause sleeping problems (jet lag). It is sometimes helpful to have someone observe your sleeping patterns. They should look for periods of not breathing during the night (sleep apnea). They should also look to see how long those periods last. If you live alone or observers are uncertain, you can also be observed at a sleep clinic where your sleep patterns will be professionally monitored. Sleep apnea requires a checkup and treatment. Give your caregivers your medical history. Give your caregivers observations your family has made about your sleep.  SYMPTOMS   Not feeling rested in the morning.  Anxiety and restlessness at bedtime.  Difficulty falling and staying asleep. TREATMENT   Your caregiver may prescribe treatment for an underlying medical disorders. Your caregiver can give advice or help if you are using  alcohol or other drugs for self-medication. Treatment of underlying problems will usually eliminate insomnia problems.  Medications can be prescribed for short time use. They are generally not recommended for lengthy use.  Over-the-counter sleep medicines are not recommended for lengthy use. They can be habit forming.  You can promote easier sleeping by making lifestyle changes such as:  Using relaxation techniques that help with breathing and reduce muscle tension.  Exercising earlier in the day.  Changing your diet and the time of your last meal. No night time snacks.  Establish a regular time to go to bed.  Counseling can help with stressful problems and worry.  Soothing music and white noise may be helpful if there are background noises you cannot remove.  Stop tedious detailed work at least one hour before bedtime. HOME CARE INSTRUCTIONS   Keep a diary. Inform your caregiver about your progress. This includes any medication side effects. See your caregiver regularly. Take note of:  Times when you are asleep.  Times when you are awake during the night.  The quality of your sleep.  How you feel the next day. This information will help your caregiver care for you.  Get out of bed if you are still awake after 15 minutes. Read or do some quiet activity. Keep the lights down. Wait until you feel sleepy and go back to bed.  Keep regular sleeping and waking hours. Avoid naps.  Exercise regularly.  Avoid distractions at bedtime. Distractions include watching television or engaging in any intense or detailed activity like attempting to balance the household checkbook.  Develop a bedtime ritual. Keep a familiar routine of bathing,  brushing your teeth, climbing into bed at the same time each night, listening to soothing music. Routines increase the success of falling to sleep faster.  Use relaxation techniques. This can be using breathing and muscle tension release routines. It  can also include visualizing peaceful scenes. You can also help control troubling or intruding thoughts by keeping your mind occupied with boring or repetitive thoughts like the old concept of counting sheep. You can make it more creative like imagining planting one beautiful flower after another in your backyard garden.  During your day, work to eliminate stress. When this is not possible use some of the previous suggestions to help reduce the anxiety that accompanies stressful situations. MAKE SURE YOU:   Understand these instructions.  Will watch your condition.  Will get help right away if you are not doing well or get worse. Document Released: 03/23/2000 Document Revised: 06/18/2011 Document Reviewed: 04/23/2007 [facility] Patient Information 2015 [place], [place]. This information is not intended to replace advice given to you by your health care provider. Make sure you discuss any questions you have with your health care provider.

## 2014-11-14 ENCOUNTER — Encounter: Payer: Self-pay | Admitting: Family Medicine

## 2014-11-14 DIAGNOSIS — G4700 Insomnia, unspecified: Secondary | ICD-10-CM

## 2014-11-14 HISTORY — DX: Insomnia, unspecified: G47.00

## 2014-11-14 NOTE — Assessment & Plan Note (Signed)
Encouraged heart healthy diet, increase exercise, avoid trans fats, consider a krill oil cap daily 

## 2014-11-14 NOTE — Assessment & Plan Note (Signed)
Encouraged good sleep hygiene such as dark, quiet room. No blue/green glowing lights such as computer screens in bedroom. No alcohol or stimulants in evening. Cut down on caffeine as able. Regular exercise is helpful but not just prior to bed time. Will try Belsomra, failed Ambien.

## 2014-11-14 NOTE — Progress Notes (Signed)
Andrea Bowman  [id number] 1961-01-13 11/14/2014      Progress Note-Follow Up  Subjective  Chief Complaint  Chief Complaint  Patient presents with  . Medication questions    states Ambien did not work for her and would like to discuss    HPI  Patient is a 54 y.o. female in today for routine medical care. He notes her sister has recently been dia54. Her anxiety has increased slightly she is managing with her current She notes that Ambien is not working to keep her asleep. Denies CP/palp/SOB/HA/congestion/fevers/GI or GU c/o. Taking meds as prescribed  Past Medical History  Diagnosis Date  . PVC (premature ventricular contraction)     takes Atenolol   . Anxiety     panic attacks  . Depression   . 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 Osteoarthritis of knee     right  . 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 Perimenopausal   . Dental infection     will finish antibiotic 05/01/2012  . Complication of anesthesia     hard to wake up post-op  . Dental bridge present     upper  . Acute bronchitis 08/24/2010  . Insomnia 11/14/2014    Past Surgical History  Procedure Laterality Date  . Cholecystectomy  2007  . Knee arthroscopy with lateral release  05/02/2012    Procedure: KNEE ARTHROSCOPY WITH LATERAL RELEASE;  Surgeon: Harvie Junior, MD;  Location: [facility];  Service: Orthopedics;  Laterality: Right;  chondroplasty with lateral release    Family History  Problem Relation Age of Onset  . Cancer Mother 67    Breast  . Hypertension Father   . Heart failure Father     CHF  . Depression Sister     previous history of  . Anxiety disorder Sister     Previous history of  . Cancer Sister     cancer  . Depression Brother   . Anxiety disorder Brother   . Ulcers Brother     PUD  . Cancer Maternal Aunt     X several w/ breast cancer, various ages  . Cancer Paternal Uncle     X several w/ prostate cancer  .  Cancer Maternal Grandmother   . Cancer Maternal Aunt     uterine  . Cancer Maternal Aunt     breast    History   Social History  . Marital Status: Married    Spouse Name: N/A  . Number of Children: 1  . Years of Education: N/A   Occupational History  .      home   Social History Main Topics  . Smoking status: Current Every Day Smoker -- 30 years    Types: Cigarettes  . Smokeless tobacco: Never Used     Comment: 3 cig./day and several e-cigarettes/day  . Alcohol Use: Yes     Comment: seldom  . Drug Use: No  . Sexual Activity:    Partners: Male     Comment: is separted from husband, he is out of the country and seeing someone else   Other Topics Concern  . Not on file   Social History Narrative   Relocated from [place]. Lives with husband. Home maker.    Current Outpatient Prescriptions on File Prior to Visit  Medication Sig Dispense Refill  . atenolol (TENORMIN) 25 MG tablet TAKE 1 TABLET (25 MG TOTAL) BY MOUTH 2 (TWO) TIMES DAILY. OFFICE VISIT  NEEDED 60 tablet 3  . escitalopram (LEXAPRO) 20 MG tablet Take 2 tablets (40 mg total) by mouth daily. 60 tablet 12  . Esomeprazole Magnesium (NEXIUM PO) Take by mouth.    Marland Kitchen ibuprofen (ADVIL,MOTRIN) 200 MG tablet Take 600 mg by mouth every 6 (six) hours as needed for pain.    . montelukast (SINGULAIR) 10 MG tablet Take 1 tablet (10 mg total) by mouth at bedtime. (Patient not taking: Reported on 07/20/2014) 30 tablet 1  . nitroGLYCERIN (NITROSTAT) 0.4 MG SL tablet Place 1 tablet (0.4 mg total) under the tongue every 5 (five) minutes as needed for chest pain. (Patient not taking: Reported on 11/05/2014) 25 tablet 3   No current facility-administered medications on file prior to visit.    Allergies  Allergen Reactions  . Prednisone Other (See Comments)    REACTION: REDNESS OF FACE    Review of Systems  Review of Systems  Constitutional: Negative for fever and malaise/fatigue.  HENT: Negative for congestion.   Eyes: Negative  for discharge.  Respiratory: Negative for shortness of breath.   Cardiovascular: Negative for chest pain, palpitations and leg swelling.  Gastrointestinal: Negative for nausea, abdominal pain and diarrhea.  Genitourinary: Negative for dysuria.  Musculoskeletal: Negative for falls.  Skin: Negative for rash.  Neurological: Negative for loss of consciousness and headaches.  Endo/Heme/Allergies: Negative for polydipsia.  Psychiatric/Behavioral: Negative for depression and suicidal ideas. The patient has insomnia. The patient is not nervous/anxious.     Objective  BP 132/76 mmHg  Pulse 61  Temp(Src) 97.6 F (36.4 C) (Oral)  Resp 18  Ht 5\' 7"  (1.702 m)  Wt 197 lb (89.359 kg)  BMI 30.85 kg/m2  SpO2 97%  Physical Exam  Physical Exam  Constitutional: She is oriented to person, place, and time and well-developed, well-nourished, and in no distress. No distress.  HENT:  Head: Normocephalic and atraumatic.  Eyes: Conjunctivae are normal.  Neck: Neck supple. No thyromegaly present.  Cardiovascular: Normal rate, regular rhythm and normal heart sounds.   No murmur heard. Pulmonary/Chest: Effort normal and breath sounds normal. She has no wheezes.  Abdominal: She exhibits no distension and no mass.  Musculoskeletal: She exhibits no edema.  Lymphadenopathy:    She has no cervical adenopathy.  Neurological: She is alert and oriented to person, place, and time.  Skin: Skin is warm and dry. No rash noted. She is not diaphoretic.  Psychiatric: Memory, affect and judgment normal.    Lab Results  Component Value Date   TSH 1.30 11/05/2014   Lab Results  Component Value Date   WBC 9.2 11/05/2014   HGB 14.3 11/05/2014   HCT 42.4 11/05/2014   MCV 97.4 11/05/2014   PLT 248.0 11/05/2014   Lab Results  Component Value Date   CREATININE 0.79 11/05/2014   BUN 13 11/05/2014   NA 140 11/05/2014   K 3.8 11/05/2014   CL 106 11/05/2014   CO2 29 11/05/2014   Lab Results  Component Value  Date   ALT 16 11/05/2014   AST 16 11/05/2014   ALKPHOS 50 11/05/2014   BILITOT 0.3 11/05/2014   Lab Results  Component Value Date   CHOL 191 11/05/2014   Lab Results  Component Value Date   HDL 56.40 11/05/2014   Lab Results  Component Value Date   LDLCALC 108* 11/05/2014   Lab Results  Component Value Date   TRIG 135.0 11/05/2014   Lab Results  Component Value Date   CHOLHDL 3 11/05/2014  Assessment & Plan  TOBACCO ABUSE Encouraged complete cessation. Discussed need to quit as relates to risk of numerous cancers, cardiac and pulmonary disease as well as neurologic complications. Counseled for greater than 3 minutes  Obesity Encouraged DASH diet, decrease po intake and increase exercise as tolerated. Needs 7-8 hours of sleep nightly. Avoid trans fats, eat small, frequent meals every 4-5 hours with lean proteins, complex carbs and healthy fats. Minimize simple carbs, GMO foods.  Hyperlipidemia Encouraged heart healthy diet, increase exercise, avoid trans fats, consider a krill oil cap daily  Hyperglycemia hgba1c acceptable, minimize simple carbs. Increase exercise as tolerated.   ANXIETY DEPRESSION Doing well on current meds no changes  Insomnia Encouraged good sleep hygiene such as dark, quiet room. No blue/green glowing lights such as computer screens in bedroom. No alcohol or stimulants in evening. Cut down on caffeine as able. Regular exercise is helpful but not just prior to bed time. Will try Belsomra, failed Ambien.

## 2014-11-14 NOTE — Assessment & Plan Note (Signed)
Encouraged complete cessation. Discussed need to quit as relates to risk of numerous cancers, cardiac and pulmonary disease as well as neurologic complications. Counseled for greater than 3 minutes 

## 2014-11-14 NOTE — Assessment & Plan Note (Signed)
hgba1c acceptable, minimize simple carbs. Increase exercise as tolerated.  

## 2014-11-14 NOTE — Assessment & Plan Note (Signed)
Encouraged DASH diet, decrease po intake and increase exercise as tolerated. Needs 7-8 hours of sleep nightly. Avoid trans fats, eat small, frequent meals every 4-5 hours with lean proteins, complex carbs and healthy fats. Minimize simple carbs, GMO foods. 

## 2014-11-14 NOTE — Assessment & Plan Note (Signed)
Doing well on current meds no changes 

## 2015-01-18 ENCOUNTER — Other Ambulatory Visit: Payer: Self-pay | Admitting: Family Medicine

## 2015-01-18 DIAGNOSIS — R739 Hyperglycemia, unspecified: Secondary | ICD-10-CM

## 2015-01-18 DIAGNOSIS — E782 Mixed hyperlipidemia: Secondary | ICD-10-CM

## 2015-01-18 DIAGNOSIS — F410 Panic disorder [episodic paroxysmal anxiety] without agoraphobia: Secondary | ICD-10-CM

## 2015-01-18 DIAGNOSIS — I10 Essential (primary) hypertension: Secondary | ICD-10-CM

## 2015-01-18 MED ORDER — CLONAZEPAM 0.5 MG PO TABS: ORAL_TABLET | ORAL | Status: DC

## 2015-01-18 NOTE — Telephone Encounter (Signed)
Printed clonazepam and faxed to pharmacy [facility].

## 2015-02-07 ENCOUNTER — Ambulatory Visit: Payer: BLUE CROSS/BLUE SHIELD | Admitting: Family Medicine

## 2015-03-16 ENCOUNTER — Encounter: Payer: Self-pay | Admitting: Family Medicine

## 2015-03-16 ENCOUNTER — Other Ambulatory Visit: Payer: Self-pay | Admitting: Family Medicine

## 2015-03-16 MED ORDER — NITROFURANTOIN MONOHYD MACRO 100 MG PO CAPS: 100.0000 mg | ORAL_CAPSULE | Freq: Two times a day (BID) | ORAL | Status: DC

## 2015-03-21 ENCOUNTER — Encounter: Payer: Self-pay | Admitting: Medical

## 2015-03-21 ENCOUNTER — Ambulatory Visit (HOSPITAL_BASED_OUTPATIENT_CLINIC_OR_DEPARTMENT_OTHER)
Admission: RE | Admit: 2015-03-21 | Discharge: 2015-03-21 | Disposition: A | Discharge status: Home / Self Care | Payer: BLUE CROSS/BLUE SHIELD | Source: Ambulatory Visit | Attending: Medical | Admitting: Medical

## 2015-03-21 ENCOUNTER — Ambulatory Visit (INDEPENDENT_AMBULATORY_CARE_PROVIDER_SITE_OTHER): Payer: BLUE CROSS/BLUE SHIELD | Admitting: Medical

## 2015-03-21 VITALS — BP 118/78 | HR 71 | Temp 98.0°F | Ht 67.0 in | Wt 188.6 lb

## 2015-03-21 DIAGNOSIS — R059 Cough, unspecified: Secondary | ICD-10-CM

## 2015-03-21 DIAGNOSIS — F17200 Nicotine dependence, unspecified, uncomplicated: Secondary | ICD-10-CM | POA: Insufficient documentation

## 2015-03-21 DIAGNOSIS — J209 Acute bronchitis, unspecified: Secondary | ICD-10-CM | POA: Diagnosis not present

## 2015-03-21 DIAGNOSIS — R05 Cough: Secondary | ICD-10-CM | POA: Insufficient documentation

## 2015-03-21 DIAGNOSIS — R61 Generalized hyperhidrosis: Secondary | ICD-10-CM | POA: Insufficient documentation

## 2015-03-21 DIAGNOSIS — R918 Other nonspecific abnormal finding of lung field: Secondary | ICD-10-CM | POA: Insufficient documentation

## 2015-03-21 DIAGNOSIS — R062 Wheezing: Secondary | ICD-10-CM | POA: Diagnosis not present

## 2015-03-21 DIAGNOSIS — R197 Diarrhea, unspecified: Secondary | ICD-10-CM

## 2015-03-21 DIAGNOSIS — R0989 Other specified symptoms and signs involving the circulatory and respiratory systems: Secondary | ICD-10-CM | POA: Insufficient documentation

## 2015-03-21 MED ORDER — CEFDINIR 300 MG PO CAPS: 300.0000 mg | ORAL_CAPSULE | Freq: Two times a day (BID) | ORAL | Status: DC

## 2015-03-21 MED ORDER — IPRATROPIUM BROMIDE HFA 17 MCG/ACT IN AERS
2.0000 | INHALATION_SPRAY | Freq: Four times a day (QID) | RESPIRATORY_TRACT | Status: DC | PRN
Start: 2015-03-21 — End: 2015-03-21

## 2015-03-21 MED ORDER — IPRATROPIUM BROMIDE HFA 17 MCG/ACT IN AERS: 2.0000 | INHALATION_SPRAY | Freq: Four times a day (QID) | RESPIRATORY_TRACT | Status: DC | PRN

## 2015-03-21 NOTE — Progress Notes (Signed)
Pre visit review using our clinic review tool, if applicable. No additional management support is needed unless otherwise documented below in the visit note. 

## 2015-03-21 NOTE — Patient Instructions (Addendum)
For your bronchitis, I will rx cefdnir antibiotic.   Stop doxycycline antibiotic. I think you will have less side effects with cefdnir. If loose stools worsen then would need to get stool panel kit/check for c dif. During the interim while on antibiotic use probiotic.  For wheezing rx atrovent and can continue albuterol inhaler.  For your chest congestion and cough will get cxr.  For you cough you can use benzonatate 200 mg every hours.  Follow up in 7 days or as needed  Please see note section for communication with patient on December 13,2016.

## 2015-03-21 NOTE — Progress Notes (Addendum)
Subjective:    Patient ID: Andrea Bowman, female    DOB: Jan 20, 1961, 54 y.o.   MRN: [id number]  HPI   Pt in with some diarrhea. Pt states she started doxycycllne this Saturday.  Pt states cough, nasal congestion(2 wks) then she got sinus pressure. Pt states dx with sinus infection from urgent care. Pt also go tussi-caps and inhaler from urgent care(she went to [facility]).  Pt loose stools started on Sunday. Pt loose stools about 2-3 times a day. Pt does not remember ever being on doxycycline before.  Pt not on any probioitcs. Pt also having some nausea since being on antibioitics.  Pt states her sinus are not much better.   Pt states had some diffuse body aches on Friday night.   Pt stopped smoking on Wednesday night.    Review of Systems  Constitutional: Negative for fever, chills and fatigue.  HENT: Positive for congestion. Negative for postnasal drip, sinus pressure, sore throat and tinnitus.   Respiratory: Positive for cough and wheezing. Negative for apnea, choking, chest tightness and shortness of breath.        Some productive cough.  Gastrointestinal: Positive for nausea and diarrhea. Negative for vomiting, abdominal pain, constipation, anal bleeding and rectal pain.  Skin: Negative for rash.  Neurological: Negative for dizziness and light-headedness.  Hematological: Negative for adenopathy. Does not bruise/bleed easily.  Psychiatric/Behavioral: Negative for behavioral problems and confusion.     Past Medical History  Diagnosis Date  . PVC (premature ventricular contraction)     takes Atenolol   . Anxiety     panic attacks  . Depression   . 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 Osteoarthritis of knee     right  . 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 Perimenopausal   . Dental infection     will finish antibiotic 05/01/2012  . Complication of anesthesia     hard to wake up post-op  . Dental bridge present    upper  . Acute bronchitis 08/24/2010  . Insomnia 11/14/2014    Social History   Social History  . Marital Status: Married    Spouse Name: N/A  . Number of Children: 1  . Years of Education: N/A   Occupational History  .      home   Social History Main Topics  . Smoking status: Current Every Day Smoker -- 30 years    Types: Cigarettes  . Smokeless tobacco: Never Used     Comment: 3 cig./day and several e-cigarettes/day  . Alcohol Use: Yes     Comment: seldom  . Drug Use: No  . Sexual Activity:    Partners: Male     Comment: is separted from husband, he is out of the country and seeing someone else   Other Topics Concern  . Not on file   Social History Narrative   Relocated from [place]. Lives with husband. Home maker.    Past Surgical History  Procedure Laterality Date  . Cholecystectomy  2007  . Knee arthroscopy with lateral release  05/02/2012    Procedure: KNEE ARTHROSCOPY WITH LATERAL RELEASE;  Surgeon: Alta Corning, MD;  Location: [place];  Service: Orthopedics;  Laterality: Right;  chondroplasty with lateral release    Family History  Problem Relation Age of Onset  . Cancer Mother 13    Breast  . Hypertension Father   . Heart failure Father     CHF  . Depression  Sister     previous history of  . Anxiety disorder Sister     Previous history of  . Cancer Sister     cancer  . Depression Brother   . Anxiety disorder Brother   . Ulcers Brother     PUD  . Cancer Maternal Aunt     X several w/ breast cancer, various ages  . Cancer Paternal Uncle     X several w/ prostate cancer  . Cancer Maternal Grandmother   . Cancer Maternal Aunt     uterine  . Cancer Maternal Aunt     breast    Allergies  Allergen Reactions  . Prednisone Other (See Comments)    REACTION: REDNESS OF FACE    Current Outpatient Prescriptions on File Prior to Visit  Medication Sig Dispense Refill  . albuterol (PROVENTIL HFA;VENTOLIN HFA) 108 (90 BASE) MCG/ACT  inhaler Inhale 2 puffs into the lungs every 6 (six) hours as needed for wheezing or shortness of breath. 1 Inhaler 2  . atenolol (TENORMIN) 25 MG tablet TAKE 1 TABLET (25 MG TOTAL) BY MOUTH 2 (TWO) TIMES DAILY. OFFICE VISIT NEEDED 60 tablet 3  . clonazePAM (KLONOPIN) 0.5 MG tablet TAKE 1 TABLET BY MOUTH 3 TIMES A DAY AS NEEDED FOR ANXIETY 90 tablet 0  . escitalopram (LEXAPRO) 20 MG tablet Take 2 tablets (40 mg total) by mouth daily. 60 tablet 12  . Esomeprazole Magnesium (NEXIUM PO) Take by mouth.    Marland Kitchen ibuprofen (ADVIL,MOTRIN) 200 MG tablet Take 600 mg by mouth every 6 (six) hours as needed for pain.    . montelukast (SINGULAIR) 10 MG tablet Take 1 tablet (10 mg total) by mouth at bedtime. 30 tablet 1  . nitrofurantoin, macrocrystal-monohydrate, (MACROBID) 100 MG capsule Take 1 capsule (100 mg total) by mouth 2 (two) times daily. 6 capsule 0  . nitroGLYCERIN (NITROSTAT) 0.4 MG SL tablet Place 1 tablet (0.4 mg total) under the tongue every 5 (five) minutes as needed for chest pain. 25 tablet 3  . Suvorexant (BELSOMRA) 5 MG TABS Take 5 mg by mouth at bedtime. 10 tablet 1   No current facility-administered medications on file prior to visit.    BP 118/78 mmHg  Pulse 71  Temp(Src) 98 F (36.7 C) (Oral)  Ht 5' 7"  (1.702 m)  Wt 188 lb 9.6 oz (85.548 kg)  BMI 29.53 kg/m2  SpO2 97%       Objective:   Physical Exam  General  Mental Status - Alert. General Appearance - Well groomed. Not in acute distress.  Skin Rashes- No Rashes.  HEENT Head- Normal. Ear Auditory Canal - Left- Normal. Right - Normal.Tympanic Membrane- Left- Normal. Right- Normal. Eye Sclera/Conjunctiva- Left- Normal. Right- Normal. Nose & Sinuses Nasal Mucosa- Left-  Boggy and Congested. Right-  Boggy and  Congested.Bilateral no  maxillary and  No frontal sinus pressure. Mouth & Throat Lips: Upper Lip- Normal: no dryness, cracking, pallor, cyanosis, or vesicular eruption. Lower Lip-Normal: no dryness, cracking,  pallor, cyanosis or vesicular eruption. Buccal Mucosa- Bilateral- No Aphthous ulcers. Oropharynx- No Discharge or Erythema.+pnd. Tonsils: Characteristics- Bilateral- No Erythema or Congestion. Size/Enlargement- Bilateral- No enlargement. Discharge- bilateral-None.  Neck Neck- Supple. No Masses.   Chest and Lung Exam Auscultation: Breath Sounds:-Clear even and unlabored.  Cardiovascular Auscultation:Rythm- Regular, rate and rhythm. Murmurs & Other Heart Sounds:Ausculatation of the heart reveal- No Murmurs.  Lymphatic Head & Neck General Head & Neck Lymphatics: Bilateral: Description- No Localized lymphadenopathy.   Abdomen Inspection:-Inspection Normal.  Palpation/Perucssion:  Palpation and Percussion of the abdomen reveal- Non Tender, No Rebound tenderness, No rigidity(Guarding) and No Palpable abdominal masses.  Liver:-Normal.  Spleen:- Normal.          Assessment & Plan:  For your bronchitis, I will rx cefdnir antibiotic.   Stop doxycycline antibiotic. I think you will have less side effects with cefdnir. If loose stools worsen then would need to get stool panel kit/check for c dif. During the interim while on antibiotic use probiotic.  For wheezing rx atrovent and can continue albuterol inhaler.  For your chest congestion and cough will get cxr.  For you cough you can use benzonatate 200 mg every hours.  Follow up in 7 days or as needed  Please see note section for communication with patient on December 13,2016.

## 2015-03-22 ENCOUNTER — Encounter: Payer: Self-pay | Admitting: Medical

## 2015-03-22 ENCOUNTER — Telehealth: Payer: Self-pay

## 2015-03-22 ENCOUNTER — Encounter: Payer: Self-pay | Admitting: Family Medicine

## 2015-03-22 LAB — CBC WITH DIFFERENTIAL/PLATELET
BASOS ABS: 0.1 10*3/uL (ref 0.0–0.1)
BASOS PCT: 0.7 % (ref 0.0–3.0)
EOS ABS: 1 10*3/uL — AB (ref 0.0–0.7)
Eosinophils Relative: 8.8 % — ABNORMAL HIGH (ref 0.0–5.0)
HCT: 44.2 % (ref 36.0–46.0)
HEMOGLOBIN: 14.7 g/dL (ref 12.0–15.0)
Lymphocytes Relative: 23.2 % (ref 12.0–46.0)
Lymphs Abs: 2.6 10*3/uL (ref 0.7–4.0)
MCHC: 33.3 g/dL (ref 30.0–36.0)
MCV: 97.3 fl (ref 78.0–100.0)
MONO ABS: 1 10*3/uL (ref 0.1–1.0)
Monocytes Relative: 8.8 % (ref 3.0–12.0)
Neutro Abs: 6.4 10*3/uL (ref 1.4–7.7)
Neutrophils Relative %: 58.5 % (ref 43.0–77.0)
Platelets: 291 10*3/uL (ref 150.0–400.0)
RBC: 4.54 Mil/uL (ref 3.87–5.11)
RDW: 13.3 % (ref 11.5–15.5)
WBC: 11 10*3/uL — AB (ref 4.0–10.5)

## 2015-03-22 MED ORDER — METHYLPREDNISOLONE 4 MG PO TABS: ORAL_TABLET | ORAL | Status: DC

## 2015-03-22 MED ORDER — AZITHROMYCIN 250 MG PO TABS: ORAL_TABLET | ORAL | Status: DC

## 2015-03-22 NOTE — Telephone Encounter (Signed)
[facility] and make sure [facility] are [facility] the azithromycin and medrol on wed morning 14 th.

## 2015-03-22 NOTE — Telephone Encounter (Signed)
Pt said that she [facility] a [facility] this morning with results and told her that she needed to check on this msg. Pt had a reaction to the meds given yesterday. She said that CMA and Ramon Dredgedward were reading it while pt was on the phone. She was told a Zpack would be sent in for her this morning and she is very disappointed that this has not been taken care of. She said that she was informed yesterday she had bronchitis and then today that she has pneumonia. She cancelled f/u pneumonia appt with Ramon DredgeEdward for 03/23/15 and states she will not see him again. She would like to see Dr. Abner GreenspanBlyth. Pt is requesting meds sent in asap and explanation for why this hasn't been taken care of.

## 2015-03-22 NOTE — Telephone Encounter (Signed)
Pt saw Andrea Bowman on 03/21/15 for Acute Bronchitis.  Listed below was his instructions:  For your bronchitis, I will rx cefdnir antibiotic.  Stop doxycycline antibiotic. I think you will have less side effects with cefdnir. If loose stools worsen then would need to get stool panel kit/check for c dif. During the interim while on antibiotic use probiotic. For wheezing rx atrovent and can continue albuterol inhaler. For your chest congestion and cough will get cxr. For you cough you can use benzonatate 200 mg every hours. Follow up in 7 days or as needed.  Antibiotic and inhalers taken.  CXR completed on 03/21/15.  Please see chart for details.    Per pt, she took the cefdnir and had an adverse reaction to it.  Therefore for she called office to make [facility] aware.  Her story was as documented below.    Andrea Bowman at 03/22/2015 3:26 PM     Status: Signed       Expand All Collapse All   Pt said that she received a call from Andrea Bowman this morning with results and told her that she needed to check on this msg. Pt had a reaction to the meds given yesterday. She said that Andrea Bowman and Andrea Bowman were reading it while pt was on the phone. She was told a Zpack would be sent in for her this morning and she is very disappointed that this has not been taken care of. She said that she was informed yesterday she had bronchitis and then today that she has pneumonia. She cancelled f/u pneumonia appt with Andrea Bowman for 03/23/15 and states she will not see him again. She would like to see Dr. Charlett Bowman. Pt is requesting meds sent in asap and explanation for why this hasn't been taken care of.        Per pt, Zpak was never sent in.  She does not want to follow up with Andrea Bowman tomorrow (03/22/15).  She would prefer to see Dr. Charlett Bowman, however Dr. Charlett Bowman is off tomorrow (03/22/15), but does have an appt available on Thursday at 9:30 am.  Per pt, the hives improved and do not itch as much.  Her concern is that she has possible  pneumonia and no antibiotic.  Please advise.

## 2015-03-22 NOTE — Telephone Encounter (Signed)
Earlier today when I talked to pt  I went over xray results with her. Explained atelectasis vs pneumonia. Explained with her recent clinical presentation best to be on antibiotic. Earlier today MA attempted to explain but pt did not understand. She did not understand atelectasis. She states she looked this up herself.

## 2015-03-22 NOTE — Telephone Encounter (Signed)
Dr. Abner GreenspanBlyth,  Pt called earlier. In summary she went to urgent care before she saw me and they gave her doxycyclne. This caused her to have mild loose stools and nausea. She expressed she did not like the effects and wanted to be on something different. I chose cefdnir. I thought this would have less GI type side effects compared to other options. She called today describing skin rash. Unclear if actually hives. I advised Francesco RunnerHolden to tell pt to stop the antibiotic. I considered giving some prednisone and switching antibiotic to azithromycin. Her chest xray shows atelectasis vs pneumonia. When we talked to her earlier she declined prednisone. She only wanted to use benadryl. I believe Francesco RunnerHolden offered her appointment with me tomorrow wed but she declined.   At this point pt expresses that she is willing to take prednisone despite the fact that it makes her face red. More side effect than allergic type reaction.   I saw that pt e-mailed you and thus I wanted to get your opinion since this is desire of the patient.  I will call the patient and give her heads up where we are in process and that I am waiting for your response.  Thanks, [employer] Joanie Duprey PA-C

## 2015-03-22 NOTE — Telephone Encounter (Signed)
I did talk with Dr.Blyth tonight to resolve any pending issues. Discussed with Dr. Abner GreenspanBlyth pt clinical course and recent reactions to medications. Discussed plan to give azithromycin and potentially giving prednisone. But some concern for her facial flushing reaction. Dr. Abner GreenspanBlyth recommended sending in medrol tapered course. I called pt and informed her that I talked with Dr. Abner GreenspanBlyth in person and she recommended  medrol.

## 2015-03-22 NOTE — Telephone Encounter (Signed)
I talked with pt she was upset since she was under impression that we were going to call in azithromycin antibiotic earlier today. I did mention this to my MA. Was not aware that is was not sent in. I did clarify with pt that she thinks she had zpack before and she can't remember having any reaction. Pt expressed extreme frustration and "explained she would not let this be end of this".  I advised pt that I would try to get Dr.Blyth opinion on dosing of prednisone. She is confident she only had facial flushing reaction and nothing else.  I apologized to pt about 4 times due to fact she was extremely upset.   I called her after hours. She thought I was calling her only due to the fact that I had gotten wind that she was upset(this was not the case).I explained to her that I was calling as a result of checking my computer every day in order to follow up on labs and saw her my chart message since typically things like this can happen and we have to be vigilant even afternoons off and on weekends.(Again apologized)

## 2015-03-22 NOTE — Telephone Encounter (Signed)
I have sent patient a my chart and will follow up later in week if she does not come in. If she does not come in on 12/15 then please call her and see how she is doing. Thanks

## 2015-03-23 ENCOUNTER — Ambulatory Visit: Payer: BLUE CROSS/BLUE SHIELD | Admitting: Medical

## 2015-03-23 ENCOUNTER — Telehealth: Payer: Self-pay | Admitting: Family Medicine

## 2015-03-23 ENCOUNTER — Other Ambulatory Visit: Payer: Self-pay

## 2015-03-23 ENCOUNTER — Telehealth: Payer: Self-pay

## 2015-03-23 MED ORDER — METHYLPREDNISOLONE 4 MG PO TABS: ORAL_TABLET | ORAL | Status: DC

## 2015-03-23 NOTE — Telephone Encounter (Signed)
Caller name: Self   Can be reached: [phone]  Pharmacy:  [id number] - [place], [place] - [phone] (Phone) [phone] (Fax)         Reason for call: States that Ramon Dredgedward was to call her in a rx for hives yesterday evening and nothing was called in. States that she is itching very bad and need this called in ASAP.

## 2015-03-23 NOTE — Telephone Encounter (Signed)
Please add cefdnir to pt allergy list

## 2015-03-23 NOTE — Telephone Encounter (Signed)
I [facility]. Also I sent in medrol dose pack. Medrol dose pack printed instead of sent electronically(I found this out this am when I checked the printer). So I asked Francesco RunnerHolden around 8 am to make sure [facility]. She faxed medrol to the pharmacy and got confirmation fax. Pharmacy called stating they did not get either rx. Our records show I sent it last night electronically zpack and she informed pharmacy they have fax confirmation.

## 2015-03-23 NOTE — Telephone Encounter (Signed)
Added to list on 03/23/15.

## 2015-03-23 NOTE — Telephone Encounter (Addendum)
Called pharmacy to verify that they have received Zpak and Medrol Rxs.  Spoke to AmeliaJohn, who says that they were received and picked up.  Called patient, she says that she has received medications.  Husband was "finally" able to pick up medications today.  Again I apologized for the whole situation.  Pt stated understanding, but was still quite upset.  She says that she never wants to see Ramon Dredgedward ever again and does not plan to come back in later this week unless she has issues with the medications.  Instead she plans to follow up with Dr. Abner GreenspanBlyth as scheduled on 03/28/15.  She says that husband sent in a MyChart message to Dr. Abner GreenspanBlyth. She says that the MyChart message explains everything that she wishes to do going forward, otherwise she has nothing else to say.  I apologized again and made her aware that Dr. Abner GreenspanBlyth was out of the off today.  She was advised to call me directly if she has further issues or concerns.  She stated understanding and agreed with plan.   No additional issues or concerns voiced at this time.

## 2015-03-23 NOTE — Telephone Encounter (Signed)
Caller name: Adelina MingsKelsey Relationship to patient: Daughter Can be reached: [phone]   Reason for call: Daughter called about patients itching problem. She is upset that patient did not receive a steroid rx on yesterday. Plse adv.

## 2015-03-24 ENCOUNTER — Other Ambulatory Visit: Payer: Self-pay | Admitting: Family Medicine

## 2015-03-24 DIAGNOSIS — R05 Cough: Secondary | ICD-10-CM

## 2015-03-24 DIAGNOSIS — R059 Cough, unspecified: Secondary | ICD-10-CM

## 2015-03-24 MED ORDER — HYDROCODONE-HOMATROPINE 5-1.5 MG/5ML PO SYRP: 5.0000 mL | ORAL_SOLUTION | Freq: Every evening | ORAL | Status: DC | PRN

## 2015-03-25 ENCOUNTER — Telehealth: Payer: Self-pay | Admitting: Behavioral Health

## 2015-03-25 NOTE — Telephone Encounter (Signed)
Unable to reach patient at time of Pre-Visit Call.  Left message for patient to return call when available.    

## 2015-03-28 ENCOUNTER — Ambulatory Visit (INDEPENDENT_AMBULATORY_CARE_PROVIDER_SITE_OTHER): Payer: BLUE CROSS/BLUE SHIELD | Admitting: Family Medicine

## 2015-03-28 ENCOUNTER — Other Ambulatory Visit: Payer: Self-pay | Admitting: Family Medicine

## 2015-03-28 ENCOUNTER — Encounter: Payer: Self-pay | Admitting: Family Medicine

## 2015-03-28 VITALS — BP 113/73 | HR 50 | Temp 98.0°F | Ht 68.0 in | Wt 191.0 lb

## 2015-03-28 DIAGNOSIS — E785 Hyperlipidemia, unspecified: Secondary | ICD-10-CM | POA: Diagnosis not present

## 2015-03-28 DIAGNOSIS — I10 Essential (primary) hypertension: Secondary | ICD-10-CM

## 2015-03-28 DIAGNOSIS — F418 Other specified anxiety disorders: Secondary | ICD-10-CM

## 2015-03-28 DIAGNOSIS — E669 Obesity, unspecified: Secondary | ICD-10-CM | POA: Diagnosis not present

## 2015-03-28 DIAGNOSIS — F419 Anxiety disorder, unspecified: Secondary | ICD-10-CM

## 2015-03-28 DIAGNOSIS — E782 Mixed hyperlipidemia: Secondary | ICD-10-CM

## 2015-03-28 DIAGNOSIS — R739 Hyperglycemia, unspecified: Secondary | ICD-10-CM | POA: Diagnosis not present

## 2015-03-28 DIAGNOSIS — E559 Vitamin D deficiency, unspecified: Secondary | ICD-10-CM

## 2015-03-28 DIAGNOSIS — Z0000 Encounter for general adult medical examination without abnormal findings: Secondary | ICD-10-CM | POA: Diagnosis not present

## 2015-03-28 DIAGNOSIS — F329 Major depressive disorder, single episode, unspecified: Secondary | ICD-10-CM

## 2015-03-28 DIAGNOSIS — F17200 Nicotine dependence, unspecified, uncomplicated: Secondary | ICD-10-CM

## 2015-03-28 DIAGNOSIS — F410 Panic disorder [episodic paroxysmal anxiety] without agoraphobia: Secondary | ICD-10-CM

## 2015-03-28 DIAGNOSIS — Z1211 Encounter for screening for malignant neoplasm of colon: Secondary | ICD-10-CM

## 2015-03-28 DIAGNOSIS — F341 Dysthymic disorder: Secondary | ICD-10-CM

## 2015-03-28 DIAGNOSIS — J209 Acute bronchitis, unspecified: Secondary | ICD-10-CM

## 2015-03-28 DIAGNOSIS — B070 Plantar wart: Secondary | ICD-10-CM

## 2015-03-28 DIAGNOSIS — F32A Depression, unspecified: Secondary | ICD-10-CM

## 2015-03-28 DIAGNOSIS — D72829 Elevated white blood cell count, unspecified: Secondary | ICD-10-CM

## 2015-03-28 LAB — COMPREHENSIVE METABOLIC PANEL
ALBUMIN: 4 g/dL (ref 3.5–5.2)
ALK PHOS: 56 U/L (ref 39–117)
ALT: 13 U/L (ref 0–35)
AST: 12 U/L (ref 0–37)
BUN: 17 mg/dL (ref 6–23)
CALCIUM: 9.5 mg/dL (ref 8.4–10.5)
CHLORIDE: 102 meq/L (ref 96–112)
CO2: 28 mEq/L (ref 19–32)
CREATININE: 0.83 mg/dL (ref 0.40–1.20)
GFR: 75.44 mL/min (ref >60.00)
Glucose, Bld: 88 mg/dL (ref 70–99)
Potassium: 4.2 mEq/L (ref 3.5–5.1)
SODIUM: 137 meq/L (ref 135–145)
TOTAL PROTEIN: 6.9 g/dL (ref 6.0–8.3)
Total Bilirubin: 0.4 mg/dL (ref 0.2–1.2)

## 2015-03-28 LAB — CBC
HCT: 45.7 % (ref 36.0–46.0)
Hemoglobin: 15.1 g/dL — ABNORMAL HIGH (ref 12.0–15.0)
MCHC: 33.1 g/dL (ref 30.0–36.0)
MCV: 97.8 fl (ref 78.0–100.0)
PLATELETS: 305 10*3/uL (ref 150.0–400.0)
RBC: 4.67 Mil/uL (ref 3.87–5.11)
RDW: 13.2 % (ref 11.5–15.5)
WBC: 12.9 10*3/uL — ABNORMAL HIGH (ref 4.0–10.5)

## 2015-03-28 LAB — LIPID PANEL
CHOLESTEROL: 191 mg/dL (ref 0–200)
HDL: 48 mg/dL (ref >39.00)
LDL CALC: 121 mg/dL — AB (ref 0–99)
NonHDL: 143.32
TRIGLYCERIDES: 112 mg/dL (ref 0.0–149.0)
Total CHOL/HDL Ratio: 4
VLDL: 22.4 mg/dL (ref 0.0–40.0)

## 2015-03-28 LAB — HEMOGLOBIN A1C: HEMOGLOBIN A1C: 5.9 % (ref 4.6–6.5)

## 2015-03-28 LAB — HIV ANTIBODY (ROUTINE TESTING W REFLEX): HIV 1&2 Ab, 4th Generation: NONREACTIVE

## 2015-03-28 LAB — HEPATITIS C ANTIBODY: HCV AB: NEGATIVE

## 2015-03-28 LAB — TSH: TSH: 1.27 u[IU]/mL (ref 0.35–4.50)

## 2015-03-28 MED ORDER — CLONAZEPAM 0.5 MG PO TABS: ORAL_TABLET | ORAL | Status: DC

## 2015-03-28 MED ORDER — ESCITALOPRAM OXALATE 20 MG PO TABS: 40.0000 mg | ORAL_TABLET | Freq: Every day | ORAL | Status: DC

## 2015-03-28 MED ORDER — ATENOLOL 25 MG PO TABS: 25.0000 mg | ORAL_TABLET | Freq: Every day | ORAL | Status: DC

## 2015-03-28 NOTE — Assessment & Plan Note (Signed)
Good response to lexapro but she has been titrating down her Lexapro. Has dropped her dose to 10 mg daily. May increase back to 20 mg as needed or drop to 5 mg if she continues to do well

## 2015-03-28 NOTE — Assessment & Plan Note (Signed)
Vitamin D 2000 IU daily 

## 2015-03-28 NOTE — Assessment & Plan Note (Signed)
Encouraged heart healthy diet, increase exercise, avoid trans fats, consider a krill oil cap daily 

## 2015-03-28 NOTE — Progress Notes (Signed)
Pre visit review using our clinic review tool, if applicable. No additional management support is needed unless otherwise documented below in the visit note. 

## 2015-03-28 NOTE — Patient Instructions (Signed)
Preventive Care for Adults, Female A healthy lifestyle and preventive care can promote health and wellness. Preventive health guidelines for women include the following key practices.  A routine yearly physical is a good way to check with your health care provider about your health and preventive screening. It is a chance to share any concerns and updates on your health and to receive a thorough exam.  Visit your dentist for a routine exam and preventive care every 6 months. Brush your teeth twice a day and floss once a day. Good oral hygiene prevents tooth decay and gum disease.  The frequency of eye exams is based on your age, health, family medical history, use of contact lenses, and other factors. Follow your health care provider's recommendations for frequency of eye exams.  Eat a healthy diet. Foods like vegetables, fruits, whole grains, low-fat dairy products, and lean protein foods contain the nutrients you need without too many calories. Decrease your intake of foods high in solid fats, added sugars, and salt. Eat the right amount of calories for you.Get information about a proper diet from your health care provider, if necessary.  Regular physical exercise is one of the most important things you can do for your health. Most adults should get at least 150 minutes of moderate-intensity exercise (any activity that increases your heart rate and causes you to sweat) each week. In addition, most adults need muscle-strengthening exercises on 2 or more days a week.  Maintain a healthy weight. The body mass index (BMI) is a screening tool to identify possible weight problems. It provides an estimate of body fat based on height and weight. Your health care provider can find your BMI and can help you achieve or maintain a healthy weight.For adults 20 years and older:  A BMI below 18.5 is considered underweight.  A BMI of 18.5 to 24.9 is normal.  A BMI of 25 to 29.9 is considered overweight.  A  BMI of 30 and above is considered obese.  Maintain normal blood lipids and cholesterol levels by exercising and minimizing your intake of saturated fat. Eat a balanced diet with plenty of fruit and vegetables. Blood tests for lipids and cholesterol should begin at age 66 and be repeated every 5 years. If your lipid or cholesterol levels are high, you are over 50, or you are at high risk for heart disease, you may need your cholesterol levels checked more frequently.Ongoing high lipid and cholesterol levels should be treated with medicines if diet and exercise are not working.  If you smoke, find out from your health care provider how to quit. If you do not use tobacco, do not start.  Lung cancer screening is recommended for adults aged 32-80 years who are at high risk for developing lung cancer because of a history of smoking. A yearly low-dose CT scan of the lungs is recommended for people who have at least a 30-pack-year history of smoking and are a current smoker or have quit within the past 15 years. A pack year of smoking is smoking an average of 1 pack of cigarettes a day for 1 year (for example: 1 pack a day for 30 years or 2 packs a day for 15 years). Yearly screening should continue until the smoker has stopped smoking for at least 15 years. Yearly screening should be stopped for people who develop a health problem that would prevent them from having lung cancer treatment.  If you are pregnant, do not drink alcohol. If you are  breastfeeding, be very cautious about drinking alcohol. If you are not pregnant and choose to drink alcohol, do not have more than 1 drink per day. One drink is considered to be 12 ounces (355 mL) of beer, 5 ounces (148 mL) of wine, or 1.5 ounces (44 mL) of liquor.  Avoid use of street drugs. Do not share needles with anyone. Ask for help if you need support or instructions about stopping the use of drugs.  High blood pressure causes heart disease and increases the risk  of stroke. Your blood pressure should be checked at least every 1 to 2 years. Ongoing high blood pressure should be treated with medicines if weight loss and exercise do not work.  If you are 50-28 years old, ask your health care provider if you should take aspirin to prevent strokes.  Diabetes screening is done by taking a blood sample to check your blood glucose level after you have not eaten for a certain period of time (fasting). If you are not overweight and you do not have risk factors for diabetes, you should be screened once every 3 years starting at age 53. If you are overweight or obese and you are 51-14 years of age, you should be screened for diabetes every year as part of your cardiovascular risk assessment.  Breast cancer screening is essential preventive care for women. You should practice "breast self-awareness." This means understanding the normal appearance and feel of your breasts and may include breast self-examination. Any changes detected, no matter how small, should be reported to a health care provider. Women in their 58s and 30s should have a clinical breast exam (CBE) by a health care provider as part of a regular health exam every 1 to 3 years. After age 16, women should have a CBE every year. Starting at age 26, women should consider having a mammogram (breast X-ray test) every year. Women who have a family history of breast cancer should talk to their health care provider about genetic screening. Women at a high risk of breast cancer should talk to their health care providers about having an MRI and a mammogram every year.  Breast cancer gene (BRCA)-related cancer risk assessment is recommended for women who have family members with BRCA-related cancers. BRCA-related cancers include breast, ovarian, tubal, and peritoneal cancers. Having family members with these cancers may be associated with an increased risk for harmful changes (mutations) in the breast cancer genes BRCA1 and  BRCA2. Results of the assessment will determine the need for genetic counseling and BRCA1 and BRCA2 testing.  Your health care provider may recommend that you be screened regularly for cancer of the pelvic organs (ovaries, uterus, and vagina). This screening involves a pelvic examination, including checking for microscopic changes to the surface of your cervix (Pap test). You may be encouraged to have this screening done every 3 years, beginning at age 21.  For women ages 31-65, health care providers may recommend pelvic exams and Pap testing every 3 years, or they may recommend the Pap and pelvic exam, combined with testing for human papilloma virus (HPV), every 5 years. Some types of HPV increase your risk of cervical cancer. Testing for HPV may also be done on women of any age with unclear Pap test results.  Other health care providers may not recommend any screening for nonpregnant women who are considered low risk for pelvic cancer and who do not have symptoms. Ask your health care provider if a screening pelvic exam is right for  you.  If you have had past treatment for cervical cancer or a condition that could lead to cancer, you need Pap tests and screening for cancer for at least 20 years after your treatment. If Pap tests have been discontinued, your risk factors (such as having a new sexual partner) need to be reassessed to determine if screening should resume. Some women have medical problems that increase the chance of getting cervical cancer. In these cases, your health care provider may recommend more frequent screening and Pap tests.  Colorectal cancer can be detected and often prevented. Most routine colorectal cancer screening begins at the age of 50 years and continues through age 75 years. However, your health care provider may recommend screening at an earlier age if you have risk factors for colon cancer. On a yearly basis, your health care provider may provide home test kits to check  for hidden blood in the stool. Use of a small camera at the end of a tube, to directly examine the colon (sigmoidoscopy or colonoscopy), can detect the earliest forms of colorectal cancer. Talk to your health care provider about this at age 50, when routine screening begins. Direct exam of the colon should be repeated every 5-10 years through age 75 years, unless early forms of precancerous polyps or small growths are found.  People who are at an increased risk for hepatitis B should be screened for this virus. You are considered at high risk for hepatitis B if:  You were born in a country where hepatitis B occurs often. Talk with your health care provider about which countries are considered high risk.  Your parents were born in a high-risk country and you have not received a shot to protect against hepatitis B (hepatitis B vaccine).  You have HIV or AIDS.  You use needles to inject street drugs.  You live with, or have sex with, someone who has hepatitis B.  You get hemodialysis treatment.  You take certain medicines for conditions like cancer, organ transplantation, and autoimmune conditions.  Hepatitis C blood testing is recommended for all people born from 1945 through 1965 and any individual with known risks for hepatitis C.  Practice safe sex. Use condoms and avoid high-risk sexual practices to reduce the spread of sexually transmitted infections (STIs). STIs include gonorrhea, chlamydia, syphilis, trichomonas, herpes, HPV, and human immunodeficiency virus (HIV). Herpes, HIV, and HPV are viral illnesses that have no cure. They can result in disability, cancer, and death.  You should be screened for sexually transmitted illnesses (STIs) including gonorrhea and chlamydia if:  You are sexually active and are younger than 24 years.  You are older than 24 years and your health care provider tells you that you are at risk for this type of infection.  Your sexual activity has changed  since you were last screened and you are at an increased risk for chlamydia or gonorrhea. Ask your health care provider if you are at risk.  If you are at risk of being infected with HIV, it is recommended that you take a prescription medicine daily to prevent HIV infection. This is called preexposure prophylaxis (PrEP). You are considered at risk if:  You are sexually active and do not regularly use condoms or know the HIV status of your partner(s).  You take drugs by injection.  You are sexually active with a partner who has HIV.  Talk with your health care provider about whether you are at high risk of being infected with HIV. If   you choose to begin PrEP, you should first be tested for HIV. You should then be tested every 3 months for as long as you are taking PrEP.  Osteoporosis is a disease in which the bones lose minerals and strength with aging. This can result in serious bone fractures or breaks. The risk of osteoporosis can be identified using a bone density scan. Women ages 67 years and over and women at risk for fractures or osteoporosis should discuss screening with their health care providers. Ask your health care provider whether you should take a calcium supplement or vitamin D to reduce the rate of osteoporosis.  Menopause can be associated with physical symptoms and risks. Hormone replacement therapy is available to decrease symptoms and risks. You should talk to your health care provider about whether hormone replacement therapy is right for you.  Use sunscreen. Apply sunscreen liberally and repeatedly throughout the day. You should seek shade when your shadow is shorter than you. Protect yourself by wearing long sleeves, pants, a wide-brimmed hat, and sunglasses year round, whenever you are outdoors.  Once a month, do a whole body skin exam, using a mirror to look at the skin on your back. Tell your health care provider of new moles, moles that have irregular borders, moles that  are larger than a pencil eraser, or moles that have changed in shape or color.  Stay current with required vaccines (immunizations).  Influenza vaccine. All adults should be immunized every year.  Tetanus, diphtheria, and acellular pertussis (Td, Tdap) vaccine. Pregnant women should receive 1 dose of Tdap vaccine during each pregnancy. The dose should be obtained regardless of the length of time since the last dose. Immunization is preferred during the 27th-36th week of gestation. An adult who has not previously received Tdap or who does not know her vaccine status should receive 1 dose of Tdap. This initial dose should be followed by tetanus and diphtheria toxoids (Td) booster doses every 10 years. Adults with an unknown or incomplete history of completing a 3-dose immunization series with Td-containing vaccines should begin or complete a primary immunization series including a Tdap dose. Adults should receive a Td booster every 10 years.  Varicella vaccine. An adult without evidence of immunity to varicella should receive 2 doses or a second dose if she has previously received 1 dose. Pregnant females who do not have evidence of immunity should receive the first dose after pregnancy. This first dose should be obtained before leaving the health care facility. The second dose should be obtained 4-8 weeks after the first dose.  Human papillomavirus (HPV) vaccine. Females aged 13-26 years who have not received the vaccine previously should obtain the 3-dose series. The vaccine is not recommended for use in pregnant females. However, pregnancy testing is not needed before receiving a dose. If a female is found to be pregnant after receiving a dose, no treatment is needed. In that case, the remaining doses should be delayed until after the pregnancy. Immunization is recommended for any person with an immunocompromised condition through the age of 61 years if she did not get any or all doses earlier. During the  3-dose series, the second dose should be obtained 4-8 weeks after the first dose. The third dose should be obtained 24 weeks after the first dose and 16 weeks after the second dose.  Zoster vaccine. One dose is recommended for adults aged 30 years or older unless certain conditions are present.  Measles, mumps, and rubella (MMR) vaccine. Adults born  before 1957 generally are considered immune to measles and mumps. Adults born in [birth year] or later should have 1 or more doses of MMR vaccine unless there is a contraindication to the vaccine or there is laboratory evidence of immunity to each of the three diseases. A routine second dose of MMR vaccine should be obtained at least 28 days after the first dose for students attending postsecondary schools, health care workers, or international travelers. People who received inactivated measles vaccine or an unknown type of measles vaccine during 1963-1967 should receive 2 doses of MMR vaccine. People who received inactivated mumps vaccine or an unknown type of mumps vaccine before 1979 and are at high risk for mumps infection should consider immunization with 2 doses of MMR vaccine. For females of childbearing age, rubella immunity should be determined. If there is no evidence of immunity, females who are not pregnant should be vaccinated. If there is no evidence of immunity, females who are pregnant should delay immunization until after pregnancy. Unvaccinated health care workers born before [birth year] who lack laboratory evidence of measles, mumps, or rubella immunity or laboratory confirmation of disease should consider measles and mumps immunization with 2 doses of MMR vaccine or rubella immunization with 1 dose of MMR vaccine.  Pneumococcal 13-valent conjugate (PCV13) vaccine. When indicated, a person who is uncertain of his immunization history and has no record of immunization should receive the PCV13 vaccine. All adults 67 years of age and older should receive this  vaccine. An adult aged 78 years or older who has certain medical conditions and has not been previously immunized should receive 1 dose of PCV13 vaccine. This PCV13 should be followed with a dose of pneumococcal polysaccharide (PPSV23) vaccine. Adults who are at high risk for pneumococcal disease should obtain the PPSV23 vaccine at least 8 weeks after the dose of PCV13 vaccine. Adults older than 56 years of age who have normal immune system function should obtain the PPSV23 vaccine dose at least 1 year after the dose of PCV13 vaccine.  Pneumococcal polysaccharide (PPSV23) vaccine. When PCV13 is also indicated, PCV13 should be obtained first. All adults aged 68 years and older should be immunized. An adult younger than age 11 years who has certain medical conditions should be immunized. Any person who resides in a nursing home or long-term care facility should be immunized. An adult smoker should be immunized. People with an immunocompromised condition and certain other conditions should receive both PCV13 and PPSV23 vaccines. People with human immunodeficiency virus (HIV) infection should be immunized as soon as possible after diagnosis. Immunization during chemotherapy or radiation therapy should be avoided. Routine use of PPSV23 vaccine is not recommended for American Indians, [place] Natives, or people younger than 65 years unless there are medical conditions that require PPSV23 vaccine. When indicated, people who have unknown immunization and have no record of immunization should receive PPSV23 vaccine. One-time revaccination 5 years after the first dose of PPSV23 is recommended for people aged 19-64 years who have chronic kidney failure, nephrotic syndrome, asplenia, or immunocompromised conditions. People who received 1-2 doses of PPSV23 before age 16 years should receive another dose of PPSV23 vaccine at age 71 years or later if at least 5 years have passed since the previous dose. Doses of PPSV23 are not  needed for people immunized with PPSV23 at or after age 63 years.  Meningococcal vaccine. Adults with asplenia or persistent complement component deficiencies should receive 2 doses of quadrivalent meningococcal conjugate (MenACWY-D) vaccine. The doses should be obtained  at least 2 months apart. Microbiologists working with certain meningococcal bacteria, [employer] recruits, people at risk during an outbreak, and people who travel to or live in countries with a high rate of meningitis should be immunized. A first-year college student up through age 76 years who is living in a residence hall should receive a dose if she did not receive a dose on or after her 16th birthday. Adults who have certain high-risk conditions should receive one or more doses of vaccine.  Hepatitis A vaccine. Adults who wish to be protected from this disease, have certain high-risk conditions, work with hepatitis A-infected animals, work in hepatitis A research labs, or travel to or work in countries with a high rate of hepatitis A should be immunized. Adults who were previously unvaccinated and who anticipate close contact with an international adoptee during the first 60 days after arrival in the [place] from a country with a high rate of hepatitis A should be immunized.  Hepatitis B vaccine. Adults who wish to be protected from this disease, have certain high-risk conditions, may be exposed to blood or other infectious body fluids, are household contacts or sex partners of hepatitis B positive people, are clients or workers in certain care facilities, or travel to or work in countries with a high rate of hepatitis B should be immunized.  Haemophilus influenzae type b (Hib) vaccine. A previously unvaccinated person with asplenia or sickle cell disease or having a scheduled splenectomy should receive 1 dose of Hib vaccine. Regardless of previous immunization, a recipient of a hematopoietic stem cell transplant should receive a  3-dose series 6-12 months after her successful transplant. Hib vaccine is not recommended for adults with HIV infection. Preventive Services / Frequency Ages 74 to 89 years  Blood pressure check.** / Every 3-5 years.  Lipid and cholesterol check.** / Every 5 years beginning at age 50.  Clinical breast exam.** / Every 3 years for women in their 73s and 52s.  BRCA-related cancer risk assessment.** / For women who have family members with a BRCA-related cancer (breast, ovarian, tubal, or peritoneal cancers).  Pap test.** / Every 2 years from ages 45 through 47. Every 3 years starting at age 35 through age 22 or 27 with a history of 3 consecutive normal Pap tests.  HPV screening.** / Every 3 years from ages 50 through ages 92 to 65 with a history of 3 consecutive normal Pap tests.  Hepatitis C blood test.** / For any individual with known risks for hepatitis C.  Skin self-exam. / Monthly.  Influenza vaccine. / Every year.  Tetanus, diphtheria, and acellular pertussis (Tdap, Td) vaccine.** / Consult your health care provider. Pregnant women should receive 1 dose of Tdap vaccine during each pregnancy. 1 dose of Td every 10 years.  Varicella vaccine.** / Consult your health care provider. Pregnant females who do not have evidence of immunity should receive the first dose after pregnancy.  HPV vaccine. / 3 doses over 6 months, if 6 and younger. The vaccine is not recommended for use in pregnant females. However, pregnancy testing is not needed before receiving a dose.  Measles, mumps, rubella (MMR) vaccine.** / You need at least 1 dose of MMR if you were born in 1957 or later. You may also need a 2nd dose. For females of childbearing age, rubella immunity should be determined. If there is no evidence of immunity, females who are not pregnant should be vaccinated. If there is no evidence of immunity, females who are  pregnant should delay immunization until after pregnancy.  Pneumococcal  13-valent conjugate (PCV13) vaccine.** / Consult your health care provider.  Pneumococcal polysaccharide (PPSV23) vaccine.** / 1 to 2 doses if you smoke cigarettes or if you have certain conditions.  Meningococcal vaccine.** / 1 dose if you are age 55 to 68 years and a Market researcher living in a residence hall, or have one of several medical conditions, you need to get vaccinated against meningococcal disease. You may also need additional booster doses.  Hepatitis A vaccine.** / Consult your health care provider.  Hepatitis B vaccine.** / Consult your health care provider.  Haemophilus influenzae type b (Hib) vaccine.** / Consult your health care provider. Ages 16 to 61 years  Blood pressure check.** / Every year.  Lipid and cholesterol check.** / Every 5 years beginning at age 58 years.  Lung cancer screening. / Every year if you are aged 58-80 years and have a 30-pack-year history of smoking and currently smoke or have quit within the past 15 years. Yearly screening is stopped once you have quit smoking for at least 15 years or develop a health problem that would prevent you from having lung cancer treatment.  Clinical breast exam.** / Every year after age 9 years.  BRCA-related cancer risk assessment.** / For women who have family members with a BRCA-related cancer (breast, ovarian, tubal, or peritoneal cancers).  Mammogram.** / Every year beginning at age 69 years and continuing for as long as you are in good health. Consult with your health care provider.  Pap test.** / Every 3 years starting at age 47 years through age 4 or 63 years with a history of 3 consecutive normal Pap tests.  HPV screening.** / Every 3 years from ages 68 years through ages 3 to 104 years with a history of 3 consecutive normal Pap tests.  Fecal occult blood test (FOBT) of stool. / Every year beginning at age 3 years and continuing until age 65 years. You may not need to do this test if you get  a colonoscopy every 10 years.  Flexible sigmoidoscopy or colonoscopy.** / Every 5 years for a flexible sigmoidoscopy or every 10 years for a colonoscopy beginning at age 41 years and continuing until age 26 years.  Hepatitis C blood test.** / For all people born from [birth year] through 1965 and any individual with known risks for hepatitis C.  Skin self-exam. / Monthly.  Influenza vaccine. / Every year.  Tetanus, diphtheria, and acellular pertussis (Tdap/Td) vaccine.** / Consult your health care provider. Pregnant women should receive 1 dose of Tdap vaccine during each pregnancy. 1 dose of Td every 10 years.  Varicella vaccine.** / Consult your health care provider. Pregnant females who do not have evidence of immunity should receive the first dose after pregnancy.  Zoster vaccine.** / 1 dose for adults aged 79 years or older.  Measles, mumps, rubella (MMR) vaccine.** / You need at least 1 dose of MMR if you were born in 1957 or later. You may also need a second dose. For females of childbearing age, rubella immunity should be determined. If there is no evidence of immunity, females who are not pregnant should be vaccinated. If there is no evidence of immunity, females who are pregnant should delay immunization until after pregnancy.  Pneumococcal 13-valent conjugate (PCV13) vaccine.** / Consult your health care provider.  Pneumococcal polysaccharide (PPSV23) vaccine.** / 1 to 2 doses if you smoke cigarettes or if you have certain conditions.  Meningococcal vaccine.** /  Consult your health care provider.  Hepatitis A vaccine.** / Consult your health care provider.  Hepatitis B vaccine.** / Consult your health care provider.  Haemophilus influenzae type b (Hib) vaccine.** / Consult your health care provider. Ages 60 years and over  Blood pressure check.** / Every year.  Lipid and cholesterol check.** / Every 5 years beginning at age 63 years.  Lung cancer screening. / Every year if you  are aged 21-80 years and have a 30-pack-year history of smoking and currently smoke or have quit within the past 15 years. Yearly screening is stopped once you have quit smoking for at least 15 years or develop a health problem that would prevent you from having lung cancer treatment.  Clinical breast exam.** / Every year after age 92 years.  BRCA-related cancer risk assessment.** / For women who have family members with a BRCA-related cancer (breast, ovarian, tubal, or peritoneal cancers).  Mammogram.** / Every year beginning at age 92 years and continuing for as long as you are in good health. Consult with your health care provider.  Pap test.** / Every 3 years starting at age 70 years through age 10 or 43 years with 3 consecutive normal Pap tests. Testing can be stopped between 65 and 70 years with 3 consecutive normal Pap tests and no abnormal Pap or HPV tests in the past 10 years.  HPV screening.** / Every 3 years from ages 42 years through ages 35 or 41 years with a history of 3 consecutive normal Pap tests. Testing can be stopped between 65 and 70 years with 3 consecutive normal Pap tests and no abnormal Pap or HPV tests in the past 10 years.  Fecal occult blood test (FOBT) of stool. / Every year beginning at age 37 years and continuing until age 56 years. You may not need to do this test if you get a colonoscopy every 10 years.  Flexible sigmoidoscopy or colonoscopy.** / Every 5 years for a flexible sigmoidoscopy or every 10 years for a colonoscopy beginning at age 41 years and continuing until age 46 years.  Hepatitis C blood test.** / For all people born from [birth year] through 1967 and any individual with known risks for hepatitis C.  Osteoporosis screening.** / A one-time screening for women ages 65 years and over and women at risk for fractures or osteoporosis.  Skin self-exam. / Monthly.  Influenza vaccine. / Every year.  Tetanus, diphtheria, and acellular pertussis (Tdap/Td)  vaccine.** / 1 dose of Td every 10 years.  Varicella vaccine.** / Consult your health care provider.  Zoster vaccine.** / 1 dose for adults aged 26 years or older.  Pneumococcal 13-valent conjugate (PCV13) vaccine.** / Consult your health care provider.  Pneumococcal polysaccharide (PPSV23) vaccine.** / 1 dose for all adults aged 80 years and older.  Meningococcal vaccine.** / Consult your health care provider.  Hepatitis A vaccine.** / Consult your health care provider.  Hepatitis B vaccine.** / Consult your health care provider.  Haemophilus influenzae type b (Hib) vaccine.** / Consult your health care provider. ** Family history and personal history of risk and conditions may change your health care provider's recommendations.   This information is not intended to replace advice given to you by your health care provider. Make sure you discuss any questions you have with your health care provider.   Document Released: 05/22/2001 Document Revised: 04/16/2014 Document Reviewed: 08/21/2010 Elsevier Interactive Patient Education [employer].

## 2015-03-28 NOTE — Assessment & Plan Note (Addendum)
Patient encouraged to maintain heart healthy diet, regular exercise, adequate sleep. Consider daily probiotics. Take medications as prescribed. Follows with GYN. Labs reviewed. Given and reviewed copy of ACP documents from [employer] and encouraged to complete and return. Referred for colonoscopy

## 2015-03-28 NOTE — Assessment & Plan Note (Signed)
No cigarette since becoming ill this time, encouraged to avoid if possible

## 2015-03-28 NOTE — Progress Notes (Signed)
Subjective:    Patient ID: Andrea Bowman, female    DOB: Jan 18, 1961, 54 y.o.   MRN: [id number]  Chief Complaint  Patient presents with  . Annual Exam    HPI Patient is in today for annual exam and follow-up. She has been struggling with bronchitis but has finally started to improve with the combination of azithromycin and Medrol. Feels greatly improved today with only mild congestion and cough still present. No other acute complaints. She reports feeling well. Her last Pap was in August 2014 and her last mammogram was in April 2016 and were normal. She has been decreasing her Lexapro down from 40 mg daily now down to 10. She does note her mood has been slightly lower since dropping from 20-10 but she's also been ill during that time. No other acute concerns or complaints. Denies CP/palp/SOB/HA/congestion/fevers/GI or GU c/o. Taking meds as prescribed  Past Medical History  Diagnosis Date  . PVC (premature ventricular contraction)     takes Atenolol   . Anxiety     panic attacks  . Depression   . 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 Osteoarthritis of knee     right  . 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 Perimenopausal   . Dental infection     will finish antibiotic 05/01/2012  . Complication of anesthesia     hard to wake up post-op  . Dental bridge present     upper  . Acute bronchitis 08/24/2010  . Insomnia 11/14/2014    Past Surgical History  Procedure Laterality Date  . Cholecystectomy  2007  . Knee arthroscopy with lateral release  05/02/2012    Procedure: KNEE ARTHROSCOPY WITH LATERAL RELEASE;  Surgeon: Harvie JuniorJohn L Graves, MD;  Location: [facility];  Service: Orthopedics;  Laterality: Right;  chondroplasty with lateral release    Family History  Problem Relation Age of Onset  . Cancer Mother [id number]    Breast  . Hypertension Father   . Heart failure Father     CHF  . Depression Sister     previous history of    . Anxiety disorder Sister     Previous history of  . Cancer Sister     cancer, breast  . Depression Brother   . Anxiety disorder Brother   . Ulcers Brother     PUD  . Cancer Brother     skin  . Cancer Maternal Aunt     X several w/ breast cancer, various ages  . Cancer Paternal Uncle     X several w/ prostate cancer, 1 w/colon cancer  . Cancer Maternal Aunt     uterine  . Cancer Maternal Aunt     breast    Social History   Social History  . Marital Status: Married    Spouse Name: N/A  . Number of Children: 1  . Years of Education: N/A   Occupational History  .      home   Social History Main Topics  . Smoking status: Current Every Day Smoker -- 30 years    Types: Cigarettes  . Smokeless tobacco: Never Used     Comment: 3 cig./day and several e-cigarettes/day  . Alcohol Use: Yes     Comment: seldom  . Drug Use: No  . Sexual Activity:    Partners: Male     Comment: is separted from husband, he is out of the country and seeing someone else  Other Topics Concern  . Not on file   Social History Narrative   Relocated from [place]. Lives with husband. Home maker.    Outpatient Prescriptions Prior to Visit  Medication Sig Dispense Refill  . albuterol (PROVENTIL HFA;VENTOLIN HFA) 108 (90 BASE) MCG/ACT inhaler Inhale 2 puffs into the lungs every 6 (six) hours as needed for wheezing or shortness of breath. 1 Inhaler 2  . Esomeprazole Magnesium (NEXIUM PO) Take by mouth.    Marland Kitchen ibuprofen (ADVIL,MOTRIN) 200 MG tablet Take 600 mg by mouth every 6 (six) hours as needed for pain.    Marland Kitchen ipratropium (ATROVENT HFA) 17 MCG/ACT inhaler Inhale 2 puffs into the lungs every 6 (six) hours as needed for wheezing. 1 Inhaler 1  . montelukast (SINGULAIR) 10 MG tablet Take 1 tablet (10 mg total) by mouth at bedtime. 30 tablet 1  . nitroGLYCERIN (NITROSTAT) 0.4 MG SL tablet Place 1 tablet (0.4 mg total) under the tongue every 5 (five) minutes as needed for chest pain. 25 tablet 3  .  Suvorexant (BELSOMRA) 5 MG TABS Take 5 mg by mouth at bedtime. 10 tablet 1  . atenolol (TENORMIN) 25 MG tablet TAKE 1 TABLET (25 MG TOTAL) BY MOUTH 2 (TWO) TIMES DAILY. OFFICE VISIT NEEDED 60 tablet 3  . clonazePAM (KLONOPIN) 0.5 MG tablet TAKE 1 TABLET BY MOUTH 3 TIMES A DAY AS NEEDED FOR ANXIETY 90 tablet 0  . escitalopram (LEXAPRO) 20 MG tablet Take 2 tablets (40 mg total) by mouth daily. 60 tablet 12  . methylPREDNISolone (MEDROL) 4 MG tablet 5 tabs po day 1, then 4 tabs po day 2, then 3 tab po day 3, then 2 tab po day 4, then 1 tab po day 5. 15 tablet 0  . azithromycin (ZITHROMAX) 250 MG tablet Take 2 tablets by mouth on day 1, followed by 1 tablet by mouth daily for 4 days. 6 tablet 0  . benzonatate (TESSALON) 100 MG capsule     . doxycycline (VIBRAMYCIN) 100 MG capsule     . HYDROcodone-homatropine (HYCODAN) 5-1.5 MG/5ML syrup Take 5 mLs by mouth at bedtime as needed for cough. 120 mL 0  . nitrofurantoin, macrocrystal-monohydrate, (MACROBID) 100 MG capsule Take 1 capsule (100 mg total) by mouth 2 (two) times daily. 6 capsule 0   No facility-administered medications prior to visit.    Allergies  Allergen Reactions  . Cefdinir Hives  . Doxycycline Diarrhea  . Prednisone Other (See Comments)    REACTION: REDNESS OF FACE    Review of Systems  Constitutional: Negative for fever, chills and malaise/fatigue.  HENT: Positive for congestion. Negative for hearing loss.   Eyes: Negative for discharge.  Respiratory: Positive for cough. Negative for sputum production and shortness of breath.   Cardiovascular: Negative for chest pain, palpitations and leg swelling.  Gastrointestinal: Negative for heartburn, nausea, vomiting, abdominal pain, diarrhea, constipation and blood in stool.  Genitourinary: Negative for dysuria, urgency, frequency and hematuria.  Musculoskeletal: Negative for myalgias, back pain and falls.  Skin: Negative for rash.  Neurological: Negative for dizziness, sensory  change, loss of consciousness, weakness and headaches.  Endo/Heme/Allergies: Negative for environmental allergies. Does not bruise/bleed easily.  Psychiatric/Behavioral: Negative for depression and suicidal ideas. The patient is not nervous/anxious and does not have insomnia.        Objective:    Physical Exam  Constitutional: She is oriented to person, place, and time. She appears well-developed and well-nourished. No distress.  HENT:  Head: Normocephalic and atraumatic.  Eyes: Conjunctivae  are normal.  Neck: Neck supple. No thyromegaly present.  Cardiovascular: Normal rate, regular rhythm and normal heart sounds.   No murmur heard. Pulmonary/Chest: Effort normal and breath sounds normal. No respiratory distress.  Abdominal: Soft. Bowel sounds are normal. She exhibits no distension and no mass. There is no tenderness.  Musculoskeletal: She exhibits no edema.  Lymphadenopathy:    She has no cervical adenopathy.  Neurological: She is alert and oriented to person, place, and time.  Skin: Skin is warm and dry.  Psychiatric: She has a normal mood and affect. Her behavior is normal.    BP 113/73 mmHg  Pulse 50  Temp(Src) 98 F (36.7 C) (Oral)  Ht 5\' 8"  (1.727 m)  Wt 191 lb (86.637 kg)  BMI 29.05 kg/m2  SpO2 98% Wt Readings from Last 3 Encounters:  03/28/15 191 lb (86.637 kg)  03/21/15 188 lb 9.6 oz (85.548 kg)  11/05/14 197 lb (89.359 kg)     Lab Results  Component Value Date   WBC 12.9* 03/28/2015   HGB 15.1* 03/28/2015   HCT 45.7 03/28/2015   PLT 305.0 03/28/2015   GLUCOSE 88 03/28/2015   CHOL 191 03/28/2015   TRIG 112.0 03/28/2015   HDL 48.00 03/28/2015   LDLDIRECT 144.2 01/30/2012   LDLCALC 121* 03/28/2015   ALT 13 03/28/2015   AST 12 03/28/2015   NA 137 03/28/2015   K 4.2 03/28/2015   CL 102 03/28/2015   CREATININE 0.83 03/28/2015   BUN 17 03/28/2015   CO2 28 03/28/2015   TSH 1.27 03/28/2015   INR 1.0 06/16/2012   HGBA1C 5.9 03/28/2015    Lab Results    Component Value Date   TSH 1.27 03/28/2015   Lab Results  Component Value Date   WBC 12.9* 03/28/2015   HGB 15.1* 03/28/2015   HCT 45.7 03/28/2015   MCV 97.8 03/28/2015   PLT 305.0 03/28/2015   Lab Results  Component Value Date   NA 137 03/28/2015   K 4.2 03/28/2015   CO2 28 03/28/2015   GLUCOSE 88 03/28/2015   BUN 17 03/28/2015   CREATININE 0.83 03/28/2015   BILITOT 0.4 03/28/2015   ALKPHOS 56 03/28/2015   AST 12 03/28/2015   ALT 13 03/28/2015   PROT 6.9 03/28/2015   ALBUMIN 4.0 03/28/2015   CALCIUM 9.5 03/28/2015   GFR 75.44 03/28/2015   Lab Results  Component Value Date   CHOL 191 03/28/2015   Lab Results  Component Value Date   HDL 48.00 03/28/2015   Lab Results  Component Value Date   LDLCALC 121* 03/28/2015   Lab Results  Component Value Date   TRIG 112.0 03/28/2015   Lab Results  Component Value Date   CHOLHDL 4 03/28/2015   Lab Results  Component Value Date   HGBA1C 5.9 03/28/2015       Assessment & Plan:   Problem List Items Addressed This Visit    ANXIETY DEPRESSION    Good response to lexapro but she has been titrating down her Lexapro. Has dropped her dose to 10 mg daily. May increase back to 20 mg as needed or drop to 5 mg if she continues to do well      Relevant Medications   escitalopram (LEXAPRO) 20 MG tablet   Hyperglycemia    hgba1c acceptable, minimize simple carbs. Increase exercise as tolerated.       Relevant Medications   clonazePAM (KLONOPIN) 0.5 MG tablet   Other Relevant Orders   CBC (Completed)  TSH (Completed)   Lipid panel (Completed)   Comprehensive metabolic panel (Completed)   Hemoglobin A1c (Completed)   Hepatitis C antibody (Completed)   HIV antibody (with reflex) (Completed)   Ambulatory referral to Gastroenterology   Hyperlipidemia    Encouraged heart healthy diet, increase exercise, avoid trans fats, consider a krill oil cap daily      Relevant Medications   atenolol (TENORMIN) 25 MG tablet    Other Relevant Orders   CBC (Completed)   TSH (Completed)   Lipid panel (Completed)   Comprehensive metabolic panel (Completed)   Hemoglobin A1c (Completed)   Hepatitis C antibody (Completed)   HIV antibody (with reflex) (Completed)   Ambulatory referral to Gastroenterology   Obesity    Encouraged DASH diet, decrease po intake and increase exercise as tolerated. Needs 7-8 hours of sleep nightly. Avoid trans fats, eat small, frequent meals every 4-5 hours with lean proteins, complex carbs and healthy fats. Minimize simple carbs      Relevant Orders   CBC (Completed)   TSH (Completed)   Lipid panel (Completed)   Comprehensive metabolic panel (Completed)   Hemoglobin A1c (Completed)   Hepatitis C antibody (Completed)   HIV antibody (with reflex) (Completed)   Ambulatory referral to Gastroenterology   Panic anxiety syndrome - Primary   Relevant Medications   clonazePAM (KLONOPIN) 0.5 MG tablet   escitalopram (LEXAPRO) 20 MG tablet   Other Relevant Orders   CBC (Completed)   TSH (Completed)   Lipid panel (Completed)   Comprehensive metabolic panel (Completed)   Hemoglobin A1c (Completed)   Hepatitis C antibody (Completed)   HIV antibody (with reflex) (Completed)   Ambulatory referral to Gastroenterology   Plantar wart, left foot   Relevant Orders   CBC (Completed)   TSH (Completed)   Lipid panel (Completed)   Comprehensive metabolic panel (Completed)   Hemoglobin A1c (Completed)   Hepatitis C antibody (Completed)   HIV antibody (with reflex) (Completed)   Ambulatory referral to Gastroenterology   Preventative health care    Patient encouraged to maintain heart healthy diet, regular exercise, adequate sleep. Consider daily probiotics. Take medications as prescribed. Follows with GYN. Labs reviewed. Given and reviewed copy of ACP documents from [employer] and encouraged to complete and return. Referred for colonoscopy      TOBACCO ABUSE    No cigarette since  becoming ill this time, encouraged to avoid if possible      Relevant Orders   CBC (Completed)   TSH (Completed)   Lipid panel (Completed)   Comprehensive metabolic panel (Completed)   Hemoglobin A1c (Completed)   Hepatitis C antibody (Completed)   HIV antibody (with reflex) (Completed)   Ambulatory referral to Gastroenterology   Vitamin D deficiency    Vitamin D 2000 IU daily      Relevant Orders   CBC (Completed)   TSH (Completed)   Lipid panel (Completed)   Comprehensive metabolic panel (Completed)   Hemoglobin A1c (Completed)   Hepatitis C antibody (Completed)   HIV antibody (with reflex) (Completed)   Ambulatory referral to Gastroenterology    Other Visit Diagnoses    Hyperlipidemia, mixed        Relevant Medications    atenolol (TENORMIN) 25 MG tablet    clonazePAM (KLONOPIN) 0.5 MG tablet    Other Relevant Orders    CBC (Completed)    TSH (Completed)    Lipid panel (Completed)    Comprehensive metabolic panel (Completed)    Hemoglobin A1c (Completed)  Hepatitis C antibody (Completed)    HIV antibody (with reflex) (Completed)    Ambulatory referral to Gastroenterology    Benign essential HTN        Relevant Medications    atenolol (TENORMIN) 25 MG tablet    clonazePAM (KLONOPIN) 0.5 MG tablet    Other Relevant Orders    CBC (Completed)    TSH (Completed)    Lipid panel (Completed)    Comprehensive metabolic panel (Completed)    Hemoglobin A1c (Completed)    Hepatitis C antibody (Completed)    HIV antibody (with reflex) (Completed)    Ambulatory referral to Gastroenterology    Anxiety and depression        Relevant Medications    escitalopram (LEXAPRO) 20 MG tablet    Other Relevant Orders    CBC (Completed)    TSH (Completed)    Lipid panel (Completed)    Comprehensive metabolic panel (Completed)    Hemoglobin A1c (Completed)    Hepatitis C antibody (Completed)    HIV antibody (with reflex) (Completed)    Ambulatory referral to Gastroenterology     Colon cancer screening        Relevant Orders    CBC (Completed)    TSH (Completed)    Lipid panel (Completed)    Comprehensive metabolic panel (Completed)    Hemoglobin A1c (Completed)    Hepatitis C antibody (Completed)    HIV antibody (with reflex) (Completed)    Ambulatory referral to Gastroenterology    Acute bronchitis, unspecified organism        greatly improved with course of azithromycin and Medrol which she has just finished. she will report if symptoms return.       I have discontinued Ms. Capurro's nitrofurantoin (macrocrystal-monohydrate), doxycycline, benzonatate, azithromycin, methylPREDNISolone, and HYDROcodone-homatropine. I have also changed her atenolol. Additionally, I am having her maintain her ibuprofen, nitroGLYCERIN, montelukast, Esomeprazole Magnesium (NEXIUM PO), albuterol, Suvorexant, ipratropium, clonazePAM, and escitalopram.  Meds ordered this encounter  Medications  . atenolol (TENORMIN) 25 MG tablet    Sig: Take 1 tablet (25 mg total) by mouth daily.    Dispense:  30 tablet    Refill:  6  . clonazePAM (KLONOPIN) 0.5 MG tablet    Sig: TAKE 1 TABLET BY MOUTH 3 TIMES A DAY AS NEEDED FOR ANXIETY    Dispense:  90 tablet    Refill:  0    This request is for a new prescription for a controlled substance as required by Federal/State law..  . escitalopram (LEXAPRO) 20 MG tablet    Sig: Take 2 tablets (40 mg total) by mouth daily.    Dispense:  30 tablet    Refill:  3     Danise Edge, MD

## 2015-03-28 NOTE — Assessment & Plan Note (Signed)
hgba1c acceptable, minimize simple carbs. Increase exercise as tolerated.  

## 2015-03-28 NOTE — Assessment & Plan Note (Signed)
Encouraged DASH diet, decrease po intake and increase exercise as tolerated. Needs 7-8 hours of sleep nightly. Avoid trans fats, eat small, frequent meals every 4-5 hours with lean proteins, complex carbs and healthy fats. Minimize simple carbs 

## 2015-03-31 ENCOUNTER — Encounter: Payer: Self-pay | Admitting: Family Medicine

## 2015-04-04 ENCOUNTER — Encounter: Payer: Self-pay | Admitting: Family Medicine

## 2015-04-05 NOTE — Telephone Encounter (Signed)
We are full today-- can she be seen in another office?

## 2015-04-06 ENCOUNTER — Ambulatory Visit (INDEPENDENT_AMBULATORY_CARE_PROVIDER_SITE_OTHER): Payer: BLUE CROSS/BLUE SHIELD | Admitting: Family Medicine

## 2015-04-06 ENCOUNTER — Encounter: Payer: Self-pay | Admitting: Family Medicine

## 2015-04-06 ENCOUNTER — Ambulatory Visit (INDEPENDENT_AMBULATORY_CARE_PROVIDER_SITE_OTHER): Payer: BLUE CROSS/BLUE SHIELD

## 2015-04-06 VITALS — BP 122/85 | HR 72 | Temp 97.8°F | Resp 16 | Ht 66.0 in | Wt 190.5 lb

## 2015-04-06 DIAGNOSIS — J441 Chronic obstructive pulmonary disease with (acute) exacerbation: Secondary | ICD-10-CM

## 2015-04-06 DIAGNOSIS — R05 Cough: Secondary | ICD-10-CM

## 2015-04-06 DIAGNOSIS — Z720 Tobacco use: Secondary | ICD-10-CM | POA: Diagnosis not present

## 2015-04-06 MED ORDER — HYDROCODONE-HOMATROPINE 5-1.5 MG/5ML PO SYRP: ORAL_SOLUTION | ORAL | Status: DC

## 2015-04-06 MED ORDER — IPRATROPIUM BROMIDE 0.02 % IN SOLN
0.5000 mg | Freq: Once | RESPIRATORY_TRACT | Status: AC
  Administered 2015-04-06: 0.5 mg via RESPIRATORY_TRACT

## 2015-04-06 MED ORDER — PREDNISONE 20 MG PO TABS: ORAL_TABLET | ORAL | Status: DC

## 2015-04-06 NOTE — Progress Notes (Signed)
OFFICE VISIT  04/06/2015   CC:  Chief Complaint  Patient presents with  . Cough    x 1 month   HPI:    Patient is a 54 y.o. Caucasian female who presents for respiratory symptoms.  Upper resp tract sx's dominated at first, but then cough prominent--first "episode" of this was 2 mo ago. Had some subjective fevers with this illness.  Got better then sx's recurred about 1 mo ago and have not gotten better.  Cough is productive/rattly.  Albuterol gives her great relief: she is using this a bit more than q 4 hours lately.  Says she hears/feels wheezing. CXR 03/21/15 showed RML atelectasis vs infiltrate + some COPD changes. Doxy tried--diarrhea occurred so she stopped it early. Cefdinir tried but got hives the next day so stopped this. Prednisone and Z pack then rx'd 03/27/15--pt felt better for a few days then sx's returned. Pt is a smoker.  Taking hycodan hs.  Past Medical History  Diagnosis Date  . PVC (premature ventricular contraction)     takes Atenolol   . Anxiety     panic attacks  . Depression   . 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 Osteoarthritis of knee     right  . 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 Perimenopausal   . Dental infection     will finish antibiotic 05/01/2012  . Complication of anesthesia     hard to wake up post-op  . Dental bridge present     upper  . Acute bronchitis 08/24/2010  . Insomnia 11/14/2014  . TOBACCO ABUSE 11/30/2009    Qualifier: Diagnosis of  By: Jens Somrenshaw, MD, Lyn HollingsheadFACC, Brian Saunders     Past Surgical History  Procedure Laterality Date  . Cholecystectomy  2007  . Knee arthroscopy with lateral release  05/02/2012    Procedure: KNEE ARTHROSCOPY WITH LATERAL RELEASE;  Surgeon: Harvie JuniorJohn L Graves, MD;  Location: [facility];  Service: Orthopedics;  Laterality: Right;  chondroplasty with lateral release    Outpatient Prescriptions Prior to Visit  Medication Sig Dispense Refill  .  albuterol (PROVENTIL HFA;VENTOLIN HFA) 108 (90 BASE) MCG/ACT inhaler Inhale 2 puffs into the lungs every 6 (six) hours as needed for wheezing or shortness of breath. 1 Inhaler 2  . atenolol (TENORMIN) 25 MG tablet Take 1 tablet (25 mg total) by mouth daily. 30 tablet 6  . clonazePAM (KLONOPIN) 0.5 MG tablet TAKE 1 TABLET BY MOUTH 3 TIMES A DAY AS NEEDED FOR ANXIETY 90 tablet 0  . escitalopram (LEXAPRO) 20 MG tablet Take 2 tablets (40 mg total) by mouth daily. 30 tablet 3  . Esomeprazole Magnesium (NEXIUM PO) Take by mouth.    Marland Kitchen. ibuprofen (ADVIL,MOTRIN) 200 MG tablet Take 600 mg by mouth every 6 (six) hours as needed for pain.    Marland Kitchen. ipratropium (ATROVENT HFA) 17 MCG/ACT inhaler Inhale 2 puffs into the lungs every 6 (six) hours as needed for wheezing. 1 Inhaler 1  . montelukast (SINGULAIR) 10 MG tablet Take 1 tablet (10 mg total) by mouth at bedtime. (Patient not taking: Reported on 04/06/2015) 30 tablet 1  . nitroGLYCERIN (NITROSTAT) 0.4 MG SL tablet Place 1 tablet (0.4 mg total) under the tongue every 5 (five) minutes as needed for chest pain. (Patient not taking: Reported on 04/06/2015) 25 tablet 3  . Suvorexant (BELSOMRA) 5 MG TABS Take 5 mg by mouth at bedtime. (Patient not taking: Reported on 04/06/2015) 10 tablet 1   No  facility-administered medications prior to visit.    Allergies  Allergen Reactions  . Cefdinir Hives  . Doxycycline Diarrhea  . Prednisone Other (See Comments)    REACTION: REDNESS OF FACE    ROS As per HPI  PE: Blood pressure 122/85, pulse 72, temperature 97.8 F (36.6 C), temperature source Oral, resp. rate 16, height  (1.676 m), weight 190 lb 8 oz (86.41 kg), SpO2 94 %. VS: noted--normal. Gen: alert, NAD, NONTOXIC APPEARING. HEENT: eyes without injection, drainage, or swelling.  Ears: EACs clear, TMs with normal light reflex and landmarks.  Nose: Clear rhinorrhea, with some dried, crusty exudate adherent to mildly injected mucosa.  No purulent d/c.  No  paranasal sinus TTP.  No facial swelling.  Throat and mouth without focal lesion.  No pharyngial swelling, erythema, or exudate.   Neck: supple, no LAD.   LUNGS: diffuse coarse insp/exp rhonchi, prolonged exp phase, nonlabored resps.   CV: RRR, no m/r/g. EXT: no c/c/e SKIN: no rash  LABS:  none  IMPRESSION AND PLAN:  Prolonged acute exacerbation of COPD. Needs to start atrovent tid that she was rx'd earlier this month. Alb/atr neb in office today: breath sounds minimally improved, slightly less prolongation of exp phase. Prednisone  qd x 5d, then  qd x 5d, then  qd x 5d. Continue albut HFA q4h prn. Continue hycodan hs and mucinex DM daytime. Encouraged pt to stop smoking. Repeat CXR ordered today.  An After Visit Summary was printed and given to the patient.   FOLLOW UP: No Follow-up on file.

## 2015-04-06 NOTE — Progress Notes (Signed)
Pre visit review using our clinic review tool, if applicable. No additional management support is needed unless otherwise documented below in the visit note. 

## 2015-05-05 LAB — HM COLONOSCOPY

## 2015-05-09 ENCOUNTER — Other Ambulatory Visit: Payer: BLUE CROSS/BLUE SHIELD

## 2015-05-10 ENCOUNTER — Other Ambulatory Visit: Payer: BLUE CROSS/BLUE SHIELD

## 2015-05-10 ENCOUNTER — Encounter: Payer: Self-pay | Admitting: Family Medicine

## 2015-05-12 ENCOUNTER — Encounter: Payer: Self-pay | Admitting: Family Medicine

## 2015-05-16 ENCOUNTER — Encounter: Payer: BLUE CROSS/BLUE SHIELD | Admitting: Family Medicine

## 2015-07-23 ENCOUNTER — Other Ambulatory Visit: Payer: Self-pay | Admitting: Family Medicine

## 2015-07-25 NOTE — Telephone Encounter (Signed)
[facility] [place]

## 2015-07-28 ENCOUNTER — Ambulatory Visit: Payer: BLUE CROSS/BLUE SHIELD | Admitting: Family Medicine

## 2015-08-04 ENCOUNTER — Ambulatory Visit: Payer: Self-pay | Admitting: Family Medicine

## 2015-09-08 ENCOUNTER — Other Ambulatory Visit: Payer: Self-pay | Admitting: Family Medicine

## 2015-09-08 DIAGNOSIS — Z1231 Encounter for screening mammogram for malignant neoplasm of breast: Secondary | ICD-10-CM

## 2015-09-14 ENCOUNTER — Ambulatory Visit (INDEPENDENT_AMBULATORY_CARE_PROVIDER_SITE_OTHER): Payer: BLUE CROSS/BLUE SHIELD

## 2015-09-14 DIAGNOSIS — Z1231 Encounter for screening mammogram for malignant neoplasm of breast: Secondary | ICD-10-CM

## 2015-11-08 ENCOUNTER — Other Ambulatory Visit: Payer: Self-pay | Admitting: Family Medicine

## 2015-11-10 MED ORDER — CLONAZEPAM 0.5 MG PO TABS: 0.5000 mg | ORAL_TABLET | Freq: Three times a day (TID) | ORAL | 0 refills | Status: DC | PRN

## 2015-11-10 NOTE — Telephone Encounter (Signed)
Rx printed, awaiting MD signature.  

## 2015-11-10 NOTE — Telephone Encounter (Signed)
Rx faxed to [facility] pharmacy.  

## 2015-12-15 ENCOUNTER — Other Ambulatory Visit: Payer: Self-pay | Admitting: Family Medicine

## 2015-12-20 ENCOUNTER — Other Ambulatory Visit: Payer: Self-pay | Admitting: Family Medicine

## 2015-12-20 DIAGNOSIS — F329 Major depressive disorder, single episode, unspecified: Secondary | ICD-10-CM

## 2015-12-20 DIAGNOSIS — F419 Anxiety disorder, unspecified: Principal | ICD-10-CM

## 2016-01-14 ENCOUNTER — Other Ambulatory Visit: Payer: Self-pay | Admitting: Family Medicine

## 2016-01-14 DIAGNOSIS — F419 Anxiety disorder, unspecified: Principal | ICD-10-CM

## 2016-01-14 DIAGNOSIS — F329 Major depressive disorder, single episode, unspecified: Secondary | ICD-10-CM

## 2016-01-14 NOTE — Telephone Encounter (Signed)
Have allowed her 6 months worth of refills but she will need to schedule a CPE to continue meds beyond that.

## 2016-03-19 ENCOUNTER — Other Ambulatory Visit: Payer: Self-pay | Admitting: Family Medicine

## 2016-03-27 ENCOUNTER — Ambulatory Visit (INDEPENDENT_AMBULATORY_CARE_PROVIDER_SITE_OTHER): Payer: BLUE CROSS/BLUE SHIELD | Admitting: Family Medicine

## 2016-03-27 ENCOUNTER — Ambulatory Visit: Payer: BLUE CROSS/BLUE SHIELD | Admitting: Family Medicine

## 2016-03-27 ENCOUNTER — Encounter: Payer: Self-pay | Admitting: Family Medicine

## 2016-03-27 VITALS — BP 120/78 | HR 76 | Temp 98.4°F | Ht 68.0 in | Wt 172.4 lb

## 2016-03-27 DIAGNOSIS — E559 Vitamin D deficiency, unspecified: Secondary | ICD-10-CM | POA: Diagnosis not present

## 2016-03-27 DIAGNOSIS — F17200 Nicotine dependence, unspecified, uncomplicated: Secondary | ICD-10-CM

## 2016-03-27 DIAGNOSIS — R05 Cough: Secondary | ICD-10-CM

## 2016-03-27 DIAGNOSIS — E785 Hyperlipidemia, unspecified: Secondary | ICD-10-CM

## 2016-03-27 DIAGNOSIS — F410 Panic disorder [episodic paroxysmal anxiety] without agoraphobia: Secondary | ICD-10-CM

## 2016-03-27 DIAGNOSIS — I10 Essential (primary) hypertension: Secondary | ICD-10-CM

## 2016-03-27 DIAGNOSIS — E782 Mixed hyperlipidemia: Secondary | ICD-10-CM

## 2016-03-27 DIAGNOSIS — R739 Hyperglycemia, unspecified: Secondary | ICD-10-CM | POA: Diagnosis not present

## 2016-03-27 DIAGNOSIS — J449 Chronic obstructive pulmonary disease, unspecified: Secondary | ICD-10-CM | POA: Insufficient documentation

## 2016-03-27 DIAGNOSIS — R059 Cough, unspecified: Secondary | ICD-10-CM

## 2016-03-27 DIAGNOSIS — Z23 Encounter for immunization: Secondary | ICD-10-CM | POA: Diagnosis not present

## 2016-03-27 DIAGNOSIS — Z87448 Personal history of other diseases of urinary system: Secondary | ICD-10-CM

## 2016-03-27 MED ORDER — ALBUTEROL SULFATE HFA 108 (90 BASE) MCG/ACT IN AERS: 2.0000 | INHALATION_SPRAY | Freq: Four times a day (QID) | RESPIRATORY_TRACT | 2 refills | Status: DC | PRN

## 2016-03-27 MED ORDER — HYDROCODONE-HOMATROPINE 5-1.5 MG/5ML PO SYRP: ORAL_SOLUTION | ORAL | 0 refills | Status: DC

## 2016-03-27 MED ORDER — CIPROFLOXACIN HCL 500 MG PO TABS: 500.0000 mg | ORAL_TABLET | Freq: Two times a day (BID) | ORAL | 0 refills | Status: DC

## 2016-03-27 MED ORDER — PREDNISONE 20 MG PO TABS: ORAL_TABLET | ORAL | 0 refills | Status: DC

## 2016-03-27 MED ORDER — CLONAZEPAM 0.5 MG PO TABS: 0.5000 mg | ORAL_TABLET | Freq: Three times a day (TID) | ORAL | 1 refills | Status: DC | PRN

## 2016-03-27 MED ORDER — CLONAZEPAM 0.5 MG PO TABS: 0.5000 mg | ORAL_TABLET | Freq: Three times a day (TID) | ORAL | 0 refills | Status: DC | PRN

## 2016-03-27 MED ORDER — ATENOLOL 25 MG PO TABS: 25.0000 mg | ORAL_TABLET | Freq: Every day | ORAL | 6 refills | Status: DC

## 2016-03-27 NOTE — Patient Instructions (Signed)

## 2016-03-27 NOTE — Progress Notes (Signed)
Pre visit review using our clinic review tool, if applicable. No additional management support is needed unless otherwise documented below in the visit note. 

## 2016-03-28 ENCOUNTER — Ambulatory Visit (HOSPITAL_BASED_OUTPATIENT_CLINIC_OR_DEPARTMENT_OTHER)
Admission: RE | Admit: 2016-03-28 | Discharge: 2016-03-28 | Disposition: A | Discharge status: Home / Self Care | Payer: BLUE CROSS/BLUE SHIELD | Source: Ambulatory Visit | Attending: Family Medicine | Admitting: Family Medicine

## 2016-03-28 ENCOUNTER — Other Ambulatory Visit (INDEPENDENT_AMBULATORY_CARE_PROVIDER_SITE_OTHER): Payer: BLUE CROSS/BLUE SHIELD

## 2016-03-28 DIAGNOSIS — E782 Mixed hyperlipidemia: Secondary | ICD-10-CM | POA: Diagnosis not present

## 2016-03-28 DIAGNOSIS — E559 Vitamin D deficiency, unspecified: Secondary | ICD-10-CM

## 2016-03-28 DIAGNOSIS — Z87448 Personal history of other diseases of urinary system: Secondary | ICD-10-CM | POA: Diagnosis not present

## 2016-03-28 DIAGNOSIS — J449 Chronic obstructive pulmonary disease, unspecified: Secondary | ICD-10-CM | POA: Insufficient documentation

## 2016-03-28 DIAGNOSIS — I10 Essential (primary) hypertension: Secondary | ICD-10-CM

## 2016-03-28 DIAGNOSIS — R05 Cough: Secondary | ICD-10-CM

## 2016-03-28 DIAGNOSIS — M4854XA Collapsed vertebra, not elsewhere classified, thoracic region, initial encounter for fracture: Secondary | ICD-10-CM | POA: Insufficient documentation

## 2016-03-28 DIAGNOSIS — R059 Cough, unspecified: Secondary | ICD-10-CM

## 2016-03-28 LAB — COMPREHENSIVE METABOLIC PANEL
ALT: 12 U/L (ref 0–35)
AST: 11 U/L (ref 0–37)
Albumin: 3.8 g/dL (ref 3.5–5.2)
Alkaline Phosphatase: 61 U/L (ref 39–117)
BUN: 13 mg/dL (ref 6–23)
CALCIUM: 8.9 mg/dL (ref 8.4–10.5)
CHLORIDE: 104 meq/L (ref 96–112)
CO2: 28 meq/L (ref 19–32)
Creatinine, Ser: 0.8 mg/dL (ref 0.40–1.20)
GFR: 78.43 mL/min (ref >60.00)
Glucose, Bld: 96 mg/dL (ref 70–99)
Potassium: 4.7 mEq/L (ref 3.5–5.1)
Sodium: 138 mEq/L (ref 135–145)
Total Bilirubin: 0.3 mg/dL (ref 0.2–1.2)
Total Protein: 6.9 g/dL (ref 6.0–8.3)

## 2016-03-28 LAB — URINALYSIS
BILIRUBIN URINE: NEGATIVE
HGB URINE DIPSTICK: NEGATIVE
KETONES UR: NEGATIVE
LEUKOCYTES UA: NEGATIVE
NITRITE: NEGATIVE
PH: 5.5 (ref 5.0–8.0)
SPECIFIC GRAVITY, URINE: 1.025 (ref 1.000–1.030)
Total Protein, Urine: NEGATIVE
URINE GLUCOSE: NEGATIVE
Urobilinogen, UA: 0.2 (ref 0.0–1.0)

## 2016-03-28 LAB — CBC
HEMATOCRIT: 41.2 % (ref 36.0–46.0)
HEMOGLOBIN: 13.9 g/dL (ref 12.0–15.0)
MCHC: 33.8 g/dL (ref 30.0–36.0)
MCV: 96.4 fl (ref 78.0–100.0)
PLATELETS: 289 10*3/uL (ref 150.0–400.0)
RBC: 4.28 Mil/uL (ref 3.87–5.11)
RDW: 13.1 % (ref 11.5–15.5)
WBC: 9.3 10*3/uL (ref 4.0–10.5)

## 2016-03-28 LAB — LIPID PANEL
CHOL/HDL RATIO: 4
Cholesterol: 153 mg/dL (ref 0–200)
HDL: 39.1 mg/dL (ref >39.00)
LDL CALC: 95 mg/dL (ref 0–99)
NonHDL: 113.73
TRIGLYCERIDES: 94 mg/dL (ref 0.0–149.0)
VLDL: 18.8 mg/dL (ref 0.0–40.0)

## 2016-03-28 LAB — TSH: TSH: 1.27 u[IU]/mL (ref 0.35–4.50)

## 2016-03-28 LAB — SEDIMENTATION RATE: Sed Rate: 11 mm/hr (ref 0–30)

## 2016-03-28 LAB — VITAMIN D 25 HYDROXY (VIT D DEFICIENCY, FRACTURES): VITD: 21.51 ng/mL — ABNORMAL LOW (ref 30.00–100.00)

## 2016-03-28 MED FILL — CIPROFLOXACIN HCL 500 MG TA: 500 | 14 days supply | Qty: 28 | Fill #0

## 2016-03-28 MED FILL — predniSONE 20 MG TABS: 20 | 16 days supply | Qty: 18 | Fill #0

## 2016-03-28 MED FILL — VENTOLIN HFA 90 MCG INHALER: 108 (90 BAS | 25 days supply | Qty: 18 | Fill #0

## 2016-03-28 MED FILL — clonazePAM 0.5 MG TABS: 0.5 | 30 days supply | Qty: 90 | Fill #0

## 2016-03-29 ENCOUNTER — Other Ambulatory Visit: Payer: Self-pay | Admitting: Family Medicine

## 2016-03-29 LAB — URINE CULTURE: Organism ID, Bacteria: NO GROWTH

## 2016-03-29 MED ORDER — VITAMIN D (ERGOCALCIFEROL) 1.25 MG (50000 UNIT) PO CAPS: 50000.0000 [IU] | ORAL_CAPSULE | ORAL | 0 refills | Status: DC

## 2016-04-11 NOTE — Assessment & Plan Note (Signed)
hgba1c acceptable, minimize simple carbs. Increase exercise as tolerated.  

## 2016-04-11 NOTE — Assessment & Plan Note (Signed)
Likely acute bronchitis. Is started on ciprofloxacin, probiotics and Mucinex

## 2016-04-11 NOTE — Progress Notes (Signed)
Patient ID: Andrea Bowman, female   DOB: Sep 07, 1960, 56 y.o.   MRN: [id number]   Subjective:    Patient ID: Andrea Bowman, female    DOB: 25-Jan-1961, 56 y.o.   MRN: [id number]  Chief Complaint  Patient presents with  . Follow-up    HPI Patient is in today for follow up. She has been struggling with increased respiratory concerns recently. She notes a cough productive of yellow phlegm, sinus pressure, PND, fatigue and malaise. denies fevers, chills but does note sneezing, right ear discomfort and sore throat. Denies CP/palp/SOB/fevers/GI or GU c/o. Taking meds as prescribed  Past Medical History:  Diagnosis Date  . Acute bronchitis 08/24/2010  . Anxiety    panic attacks  . 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 Complication of anesthesia    hard to wake up post-op  . Dental bridge present    upper  . Dental infection    will finish antibiotic 05/01/2012  . Depression   . Insomnia 11/14/2014  . Osteoarthritis of knee    right  . Perimenopausal   . PVC (premature ventricular contraction)    takes Atenolol   . TOBACCO ABUSE 11/30/2009   Qualifier: Diagnosis of  By: Jens Som, MD, Lyn Hollingshead   . 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 CHOLECYSTECTOMY  2007  . KNEE ARTHROSCOPY WITH LATERAL RELEASE  05/02/2012   Procedure: KNEE ARTHROSCOPY WITH LATERAL RELEASE;  Surgeon: Harvie Junior, MD;  Location: [facility];  Service: Orthopedics;  Laterality: Right;  chondroplasty with lateral release    Family History  Problem Relation Age of Onset  . Cancer Mother 31    Breast  . Hypertension Father   . Heart failure Father     CHF  . Depression Sister     previous history of  . Anxiety disorder Sister     Previous history of  . Cancer Sister     cancer, breast  . Depression Brother   . Anxiety disorder Brother   . Ulcers Brother     PUD  . Cancer Brother    skin  . Cancer Maternal Aunt     X several w/ breast cancer, various ages  . Cancer Paternal Uncle     X several w/ prostate cancer, 1 w/colon cancer  . Cancer Maternal Aunt     uterine  . Cancer Maternal Aunt     breast    Social History   Social History  . Marital status: Married    Spouse name: N/A  . Number of children: 1  . Years of education: N/A   Occupational History  .      home   Social History Main Topics  . Smoking status: Current Every Day Smoker    Years: 30.00    Types: Cigarettes  . Smokeless tobacco: Never Used     Comment: 3 cig./day and several e-cigarettes/day  . Alcohol use Yes     Comment: seldom  . Drug use: No  . Sexual activity: Yes    Partners: Male     Comment: is separted from husband, he is out of the country and seeing someone else   Other Topics Concern  . Not on file   Social History Narrative   Relocated from [place]. Lives with husband. Home maker.    Outpatient Medications Prior to Visit  Medication Sig Dispense Refill  .  escitalopram (LEXAPRO) 20 MG tablet TAKE 2 TABLETS (40 MG TOTAL) BY MOUTH DAILY. (PA) 30 tablet 5  . Esomeprazole Magnesium (NEXIUM PO) Take by mouth.    Marland Kitchen. ibuprofen (ADVIL,MOTRIN) 200 MG tablet Take 600 mg by mouth every 6 (six) hours as needed for pain.    Marland Kitchen. ipratropium (ATROVENT HFA) 17 MCG/ACT inhaler Inhale 2 puffs into the lungs every 6 (six) hours as needed for wheezing. 1 Inhaler 1  . albuterol (PROVENTIL HFA;VENTOLIN HFA) 108 (90 BASE) MCG/ACT inhaler Inhale 2 puffs into the lungs every 6 (six) hours as needed for wheezing or shortness of breath. 1 Inhaler 2  . atenolol (TENORMIN) 25 MG tablet TAKE 1 TABLET (25 MG TOTAL) BY MOUTH DAILY. 30 tablet 6  . clonazePAM (KLONOPIN) 0.5 MG tablet Take 1 tablet (0.5 mg total) by mouth 3 (three) times daily as needed for anxiety. 90 tablet 0  . HYDROcodone-homatropine (HYCODAN) 5-1.5 MG/5ML syrup 1-2 tsp po qhs prn cough 120 mL 0  . predniSONE (DELTASONE) 20 MG  tablet 2 tabs po qd x 5d, then 1 tab po qd x 5d, then 1/2 tab po qd x 6d (Patient not taking: Reported on 03/27/2016) 18 tablet 0   No facility-administered medications prior to visit.     Allergies  Allergen Reactions  . Cefdinir Hives  . Doxycycline Diarrhea  . Prednisone Other (See Comments)    REACTION: REDNESS OF FACE    Review of Systems  Constitutional: Positive for malaise/fatigue. Negative for fever.  HENT: Positive for congestion and sore throat.   Eyes: Negative for blurred vision.  Respiratory: Positive for cough and sputum production. Negative for shortness of breath.   Cardiovascular: Negative for chest pain, palpitations and leg swelling.  Gastrointestinal: Negative for abdominal pain, blood in stool and nausea.  Genitourinary: Negative for dysuria and frequency.  Musculoskeletal: Negative for falls.  Skin: Negative for rash.  Neurological: Negative for dizziness, loss of consciousness and headaches.  Endo/Heme/Allergies: Negative for environmental allergies.  Psychiatric/Behavioral: Negative for depression. The patient is not nervous/anxious.        Objective:    Physical Exam  Constitutional: She is oriented to person, place, and time. She appears well-developed and well-nourished. No distress.  HENT:  Head: Normocephalic and atraumatic.  Nose: Nose normal.  Oropharynx erythematous  Eyes: Right eye exhibits no discharge. Left eye exhibits no discharge.  Neck: Normal range of motion. Neck supple.  Cardiovascular: Normal rate and regular rhythm.   No murmur heard. Pulmonary/Chest: Effort normal and breath sounds normal.  Abdominal: Soft. Bowel sounds are normal. There is no tenderness.  Musculoskeletal: She exhibits no edema.  Lymphadenopathy:    She has cervical adenopathy.  Neurological: She is alert and oriented to person, place, and time.  Skin: Skin is warm and dry.  Psychiatric: She has a normal mood and affect.  Nursing note and vitals  reviewed.   BP 120/78 (BP Location: Left Arm, Patient Position: Sitting, Cuff Size: Normal)   Pulse 76   Temp 98.4 F (36.9 C) (Oral)   Ht 5\' 8"  (1.727 m)   Wt 172 lb 6 oz (78.2 kg)   SpO2 97%   BMI 26.21 kg/m  Wt Readings from Last 3 Encounters:  03/27/16 172 lb 6 oz (78.2 kg)  04/06/15 190 lb 8 oz (86.4 kg)  03/28/15 191 lb (86.6 kg)     Lab Results  Component Value Date   WBC 9.3 03/28/2016   HGB 13.9 03/28/2016   HCT 41.2 03/28/2016  PLT 289.0 03/28/2016   GLUCOSE 96 03/28/2016   CHOL 153 03/28/2016   TRIG 94.0 03/28/2016   HDL 39.10 03/28/2016   LDLDIRECT 144.2 01/30/2012   LDLCALC 95 03/28/2016   ALT 12 03/28/2016   AST 11 03/28/2016   NA 138 03/28/2016   K 4.7 03/28/2016   CL 104 03/28/2016   CREATININE 0.80 03/28/2016   BUN 13 03/28/2016   CO2 28 03/28/2016   TSH 1.27 03/28/2016   INR 1.0 06/16/2012   HGBA1C 5.9 03/28/2015    Lab Results  Component Value Date   TSH 1.27 03/28/2016   Lab Results  Component Value Date   WBC 9.3 03/28/2016   HGB 13.9 03/28/2016   HCT 41.2 03/28/2016   MCV 96.4 03/28/2016   PLT 289.0 03/28/2016   Lab Results  Component Value Date   NA 138 03/28/2016   K 4.7 03/28/2016   CO2 28 03/28/2016   GLUCOSE 96 03/28/2016   BUN 13 03/28/2016   CREATININE 0.80 03/28/2016   BILITOT 0.3 03/28/2016   ALKPHOS 61 03/28/2016   AST 11 03/28/2016   ALT 12 03/28/2016   PROT 6.9 03/28/2016   ALBUMIN 3.8 03/28/2016   CALCIUM 8.9 03/28/2016   GFR 78.43 03/28/2016   Lab Results  Component Value Date   CHOL 153 03/28/2016   Lab Results  Component Value Date   HDL 39.10 03/28/2016   Lab Results  Component Value Date   LDLCALC 95 03/28/2016   Lab Results  Component Value Date   TRIG 94.0 03/28/2016   Lab Results  Component Value Date   CHOLHDL 4 03/28/2016   Lab Results  Component Value Date   HGBA1C 5.9 03/28/2015       Assessment & Plan:   Problem List Items Addressed This Visit    TOBACCO ABUSE    Vitamin D deficiency   Relevant Orders   Urinalysis (Completed)   Urine culture (Completed)   Personal history of urinary disorder   Relevant Orders   Vitamin D (25 hydroxy) (Completed)   Hyperlipidemia - Primary    Encouraged heart healthy diet, increase exercise, avoid trans fats, consider a krill oil cap daily      Relevant Medications   atenolol (TENORMIN) 25 MG tablet   Hyperglycemia    hgba1c acceptable, minimize simple carbs. Increase exercise as tolerated.      Relevant Medications   albuterol (PROVENTIL HFA;VENTOLIN HFA) 108 (90 Base) MCG/ACT inhaler   Panic anxiety syndrome    She feels her current meds are helping, no new concerns      Relevant Medications   albuterol (PROVENTIL HFA;VENTOLIN HFA) 108 (90 Base) MCG/ACT inhaler   Cough    Likely acute bronchitis. Is started on ciprofloxacin, probiotics and Mucinex       Relevant Orders   Sedimentation rate (Completed)   DG Chest 2 View (Completed)    Other Visit Diagnoses    Hyperlipidemia, mixed       Relevant Medications   atenolol (TENORMIN) 25 MG tablet   albuterol (PROVENTIL HFA;VENTOLIN HFA) 108 (90 Base) MCG/ACT inhaler   Other Relevant Orders   Lipid panel (Completed)   Benign essential HTN       Relevant Medications   atenolol (TENORMIN) 25 MG tablet   albuterol (PROVENTIL HFA;VENTOLIN HFA) 108 (90 Base) MCG/ACT inhaler   Other Relevant Orders   Comprehensive metabolic panel (Completed)   CBC (Completed)   TSH (Completed)      I am having Ms. Andrea Bowman start  on ciprofloxacin. I am also having her maintain her ibuprofen, Esomeprazole Magnesium (NEXIUM PO), ipratropium, escitalopram, clonazePAM, predniSONE, atenolol, HYDROcodone-homatropine, and albuterol.  Meds ordered this encounter  Medications  . DISCONTD: clonazePAM (KLONOPIN) 0.5 MG tablet    Sig: Take 1 tablet (0.5 mg total) by mouth 3 (three) times daily as needed for anxiety.    Dispense:  90 tablet    Refill:  0  . clonazePAM  (KLONOPIN) 0.5 MG tablet    Sig: Take 1 tablet (0.5 mg total) by mouth 3 (three) times daily as needed for anxiety.    Dispense:  90 tablet    Refill:  1  . predniSONE (DELTASONE) 20 MG tablet    Sig: 2 tabs po qd x 5d, then 1 tab po qd x 5d, then 1/2 tab po qd x 6d    Dispense:  18 tablet    Refill:  0  . ciprofloxacin (CIPRO) 500 MG tablet    Sig: Take 1 tablet (500 mg total) by mouth 2 (two) times daily.    Dispense:  28 tablet    Refill:  0  . atenolol (TENORMIN) 25 MG tablet    Sig: Take 1 tablet (25 mg total) by mouth daily.    Dispense:  30 tablet    Refill:  6  . HYDROcodone-homatropine (HYCODAN) 5-1.5 MG/5ML syrup    Sig: 1-2 tsp po qhs prn cough    Dispense:  150 mL    Refill:  0  . albuterol (PROVENTIL HFA;VENTOLIN HFA) 108 (90 Base) MCG/ACT inhaler    Sig: Inhale 2 puffs into the lungs every 6 (six) hours as needed for wheezing or shortness of breath.    Dispense:  1 Inhaler    Refill:  2    Andrea Edge, MD

## 2016-04-11 NOTE — Assessment & Plan Note (Signed)
Encouraged heart healthy diet, increase exercise, avoid trans fats, consider a krill oil cap daily 

## 2016-04-11 NOTE — Assessment & Plan Note (Signed)
She feels her current meds are helping, no new concerns

## 2016-04-29 ENCOUNTER — Encounter: Payer: Self-pay | Admitting: Family Medicine

## 2016-05-15 ENCOUNTER — Other Ambulatory Visit: Payer: Self-pay | Admitting: Family Medicine

## 2016-05-16 NOTE — Telephone Encounter (Signed)
This appears to be a Dr. Abner GreenspanBlyth pt. If I am covering for her OK to refill.

## 2016-05-30 MED ORDER — ATENOLOL 25 MG PO TABS: 25.0000 mg | ORAL_TABLET | Freq: Every day | ORAL | 0 refills | Status: DC

## 2016-05-30 NOTE — Addendum Note (Signed)
Addended by: Kandace BlitzLONG, Febe Champa M on: 05/30/2016 01:58 PM   Modules accepted: Orders

## 2016-07-23 ENCOUNTER — Other Ambulatory Visit: Payer: Self-pay | Admitting: *Deleted

## 2016-07-23 MED ORDER — ATENOLOL 25 MG PO TABS: 25.0000 mg | ORAL_TABLET | Freq: Every day | ORAL | 1 refills | Status: DC

## 2016-07-23 NOTE — Telephone Encounter (Signed)
Rx sent to the pharmacy by e-script.//AB/CMA 

## 2016-08-17 MED FILL — clonazePAM 0.5 MG TABS: 0.5 | 30 days supply | Qty: 90 | Fill #1

## 2016-09-25 ENCOUNTER — Other Ambulatory Visit: Payer: Self-pay | Admitting: Family Medicine

## 2016-09-25 DIAGNOSIS — E782 Mixed hyperlipidemia: Secondary | ICD-10-CM

## 2016-09-25 DIAGNOSIS — I10 Essential (primary) hypertension: Secondary | ICD-10-CM

## 2016-09-25 DIAGNOSIS — F410 Panic disorder [episodic paroxysmal anxiety] without agoraphobia: Secondary | ICD-10-CM

## 2016-09-25 DIAGNOSIS — R739 Hyperglycemia, unspecified: Secondary | ICD-10-CM

## 2016-11-09 ENCOUNTER — Other Ambulatory Visit: Payer: Self-pay | Admitting: Family Medicine

## 2016-11-09 ENCOUNTER — Encounter: Payer: Self-pay | Admitting: Family Medicine

## 2016-11-09 ENCOUNTER — Telehealth: Payer: Self-pay | Admitting: Family Medicine

## 2016-11-09 DIAGNOSIS — L659 Nonscarring hair loss, unspecified: Secondary | ICD-10-CM

## 2016-11-09 NOTE — Telephone Encounter (Signed)
Pt called in to request a referral to dermatologist. She said that her hair is falling out. Pt will be going back over sees on 11/19/16, she would like to have apt by the time she leaves if possible.    Pt would like to see a provider in the [place] area if possible. She dont like to travel far.

## 2016-11-09 NOTE — Telephone Encounter (Signed)
Notify I have placed order with note for [place] and early August but she needs to know it is unlikely she will get a derm appt prior to 8/13 unless there is a cancellation

## 2016-11-09 NOTE — Telephone Encounter (Signed)
Patient informed of PCP instructions. 

## 2016-11-11 MED ORDER — CIPROFLOXACIN HCL 500 MG PO TABS: 500.0000 mg | ORAL_TABLET | Freq: Two times a day (BID) | ORAL | 0 refills | Status: DC

## 2016-11-12 ENCOUNTER — Other Ambulatory Visit: Payer: Self-pay | Admitting: Family Medicine

## 2016-11-12 DIAGNOSIS — L578 Other skin changes due to chronic exposure to nonionizing radiation: Secondary | ICD-10-CM

## 2016-11-12 MED ORDER — ATENOLOL 25 MG PO TABS: 25.0000 mg | ORAL_TABLET | Freq: Every day | ORAL | 1 refills | Status: DC

## 2016-11-12 MED ORDER — CLONAZEPAM 0.5 MG PO TABS: 0.5000 mg | ORAL_TABLET | Freq: Three times a day (TID) | ORAL | 0 refills | Status: DC | PRN

## 2016-11-12 MED FILL — CIPROFLOXACIN HCL 500 MG TA: 500 | 14 days supply | Qty: 28 | Fill #0

## 2016-11-12 MED FILL — clonazePAM 0.5 MG TABS: 0.5 | 30 days supply | Qty: 90 | Fill #0

## 2016-11-12 MED FILL — ATENOLOL 25 MG TABLET: 25 | 90 days supply | Qty: 90 | Fill #0

## 2016-12-12 ENCOUNTER — Encounter: Payer: Self-pay | Admitting: Family Medicine

## 2017-01-26 ENCOUNTER — Other Ambulatory Visit: Payer: Self-pay | Admitting: Family Medicine

## 2017-03-17 ENCOUNTER — Other Ambulatory Visit: Payer: Self-pay | Admitting: Family Medicine

## 2017-03-17 DIAGNOSIS — F419 Anxiety disorder, unspecified: Principal | ICD-10-CM

## 2017-03-17 DIAGNOSIS — F32A Depression, unspecified: Secondary | ICD-10-CM

## 2017-03-17 DIAGNOSIS — F329 Major depressive disorder, single episode, unspecified: Secondary | ICD-10-CM

## 2017-04-22 ENCOUNTER — Encounter: Payer: Self-pay | Admitting: Family Medicine

## 2017-04-22 ENCOUNTER — Ambulatory Visit (INDEPENDENT_AMBULATORY_CARE_PROVIDER_SITE_OTHER): Payer: PRIVATE HEALTH INSURANCE | Admitting: Family Medicine

## 2017-04-22 VITALS — BP 130/70 | HR 62 | Temp 98.6°F | Resp 18 | Ht 67.0 in | Wt 181.6 lb

## 2017-04-22 DIAGNOSIS — E785 Hyperlipidemia, unspecified: Secondary | ICD-10-CM | POA: Diagnosis not present

## 2017-04-22 DIAGNOSIS — R739 Hyperglycemia, unspecified: Secondary | ICD-10-CM | POA: Diagnosis not present

## 2017-04-22 DIAGNOSIS — F17200 Nicotine dependence, unspecified, uncomplicated: Secondary | ICD-10-CM

## 2017-04-22 DIAGNOSIS — E559 Vitamin D deficiency, unspecified: Secondary | ICD-10-CM

## 2017-04-22 DIAGNOSIS — R110 Nausea: Secondary | ICD-10-CM

## 2017-04-22 DIAGNOSIS — F341 Dysthymic disorder: Secondary | ICD-10-CM

## 2017-04-22 DIAGNOSIS — G4700 Insomnia, unspecified: Secondary | ICD-10-CM

## 2017-04-22 LAB — CBC
HEMATOCRIT: 43.7 % (ref 36.0–46.0)
Hemoglobin: 14.6 g/dL (ref 12.0–15.0)
MCHC: 33.3 g/dL (ref 30.0–36.0)
MCV: 99 fl (ref 78.0–100.0)
Platelets: 234 10*3/uL (ref 150.0–400.0)
RBC: 4.41 Mil/uL (ref 3.87–5.11)
RDW: 13.1 % (ref 11.5–15.5)
WBC: 7.6 10*3/uL (ref 4.0–10.5)

## 2017-04-22 LAB — COMPREHENSIVE METABOLIC PANEL
ALK PHOS: 50 U/L (ref 39–117)
ALT: 16 U/L (ref 0–35)
AST: 16 U/L (ref 0–37)
Albumin: 4.1 g/dL (ref 3.5–5.2)
BILIRUBIN TOTAL: 0.4 mg/dL (ref 0.2–1.2)
BUN: 11 mg/dL (ref 6–23)
CO2: 30 mEq/L (ref 19–32)
Calcium: 9.5 mg/dL (ref 8.4–10.5)
Chloride: 105 mEq/L (ref 96–112)
Creatinine, Ser: 0.75 mg/dL (ref 0.40–1.20)
GFR: 84.18 mL/min (ref >60.00)
Glucose, Bld: 84 mg/dL (ref 70–99)
POTASSIUM: 4.7 meq/L (ref 3.5–5.1)
SODIUM: 140 meq/L (ref 135–145)
TOTAL PROTEIN: 6.9 g/dL (ref 6.0–8.3)

## 2017-04-22 LAB — LIPID PANEL
CHOLESTEROL: 185 mg/dL (ref 0–200)
HDL: 61.6 mg/dL (ref >39.00)
LDL Cholesterol: 105 mg/dL — ABNORMAL HIGH (ref 0–99)
NONHDL: 123.69
Total CHOL/HDL Ratio: 3
Triglycerides: 94 mg/dL (ref 0.0–149.0)
VLDL: 18.8 mg/dL (ref 0.0–40.0)

## 2017-04-22 LAB — TSH: TSH: 2.02 u[IU]/mL (ref 0.35–4.50)

## 2017-04-22 LAB — HEMOGLOBIN A1C: Hgb A1c MFr Bld: 5.8 % (ref 4.6–6.5)

## 2017-04-22 LAB — VITAMIN D 25 HYDROXY (VIT D DEFICIENCY, FRACTURES): VITD: 41.54 ng/mL (ref 30.00–100.00)

## 2017-04-22 MED ORDER — CLONAZEPAM 0.5 MG PO TABS: 0.5000 mg | ORAL_TABLET | Freq: Three times a day (TID) | ORAL | 2 refills | Status: DC | PRN

## 2017-04-22 MED FILL — clonazePAM 0.5 MG TABS: 0.5 | 30 days supply | Qty: 90 | Fill #0

## 2017-04-22 NOTE — Assessment & Plan Note (Signed)
hgba1c acceptable, minimize simple carbs. Increase exercise as tolerated.  

## 2017-04-22 NOTE — Assessment & Plan Note (Signed)
Encouraged good sleep hygiene such as dark, quiet room. No blue/green glowing lights such as computer screens in bedroom. No alcohol or stimulants in evening. Cut down on caffeine as able. Regular exercise is helpful but not just prior to bed time.  

## 2017-04-22 NOTE — Assessment & Plan Note (Signed)
Takes Clonopin one daily and infrequently qhs, maybe once a month and for events like flying.t tolerates well and no concernng side effects or concerns. Doing well on Lexapro 20 mg daily

## 2017-04-22 NOTE — Assessment & Plan Note (Addendum)
Labs reveal deficiency. Start on Vitamin D [id number] IU caps, 1 cap po weekly x 12 weeks. Disp #4 with 4 rf. Also take daily Vitamin D over the counter. If already taking a daily supplement increase by 1000 IU daily and if not start Vitamin D 2000 IU daily. Recheck Vitamin D level

## 2017-04-22 NOTE — Assessment & Plan Note (Signed)
Encouraged heart healthy diet, increase exercise, avoid trans fats, consider a krill oil cap daily 

## 2017-04-22 NOTE — Patient Instructions (Signed)
Zinc, fatty acids like fish or krill oil and biotin Carbohydrate Counting for Diabetes Mellitus, Adult Carbohydrate counting is a method for keeping track of how many carbohydrates you eat. Eating carbohydrates naturally increases the amount of sugar (glucose) in the blood. Counting how many carbohydrates you eat helps keep your blood glucose within normal limits, which helps you manage your diabetes (diabetes mellitus). It is important to know how many carbohydrates you can safely have in each meal. This is different for every person. A diet and nutrition specialist (registered dietitian) can help you make a meal plan and calculate how many carbohydrates you should have at each meal and snack. Carbohydrates are found in the following foods:  Grains, such as breads and cereals.  Dried beans and soy products.  Starchy vegetables, such as potatoes, peas, and corn.  Fruit and fruit juices.  Milk and yogurt.  Sweets and snack foods, such as cake, cookies, candy, chips, and soft drinks.  How do I count carbohydrates? There are two ways to count carbohydrates in food. You can use either of the methods or a combination of both. Reading "Nutrition Facts" on packaged food The "Nutrition Facts" list is included on the labels of almost all packaged foods and beverages in the [place] It includes:  The serving size.  Information about nutrients in each serving, including the grams (g) of carbohydrate per serving.  To use the "Nutrition Facts":  Decide how many servings you will have.  Multiply the number of servings by the number of carbohydrates per serving.  The resulting number is the total amount of carbohydrates that you will be having.  Learning standard serving sizes of other foods When you eat foods containing carbohydrates that are not packaged or do not include "Nutrition Facts" on the label, you need to measure the servings in order to count the amount of carbohydrates:  Measure the  foods that you will eat with a food scale or measuring cup, if needed.  Decide how many standard-size servings you will eat.  Multiply the number of servings by 15. Most carbohydrate-rich foods have about 15 g of carbohydrates per serving. ? For example, if you eat 8 oz (170 g) of strawberries, you will have eaten 2 servings and 30 g of carbohydrates (2 servings x 15 g = 30 g).  For foods that have more than one food mixed, such as soups and casseroles, you must count the carbohydrates in each food that is included.  The following list contains standard serving sizes of common carbohydrate-rich foods. Each of these servings has about 15 g of carbohydrates:   hamburger bun or  English muffin.   oz (15 mL) syrup.   oz (14 g) jelly.  1 slice of bread.  1 six-inch tortilla.  3 oz (85 g) cooked rice or pasta.  4 oz (113 g) cooked dried beans.  4 oz (113 g) starchy vegetable, such as peas, corn, or potatoes.  4 oz (113 g) hot cereal.  4 oz (113 g) mashed potatoes or  of a large baked potato.  4 oz (113 g) canned or frozen fruit.  4 oz (120 mL) fruit juice.  4-6 crackers.  6 chicken nuggets.  6 oz (170 g) unsweetened dry cereal.  6 oz (170 g) plain fat-free yogurt or yogurt sweetened with artificial sweeteners.  8 oz (240 mL) milk.  8 oz (170 g) fresh fruit or one small piece of fruit.  24 oz (680 g) popped popcorn.  Example of carbohydrate  counting Sample meal  3 oz (85 g) chicken breast.  6 oz (170 g) brown rice.  4 oz (113 g) corn.  8 oz (240 mL) milk.  8 oz (170 g) strawberries with sugar-free whipped topping. Carbohydrate calculation 1. Identify the foods that contain carbohydrates: ? Rice. ? Corn. ? Milk. ? Strawberries. 2. Calculate how many servings you have of each food: ? 2 servings rice. ? 1 serving corn. ? 1 serving milk. ? 1 serving strawberries. 3. Multiply each number of servings by 15 g: ? 2 servings rice x 15 g = 30 g. ? 1  serving corn x 15 g = 15 g. ? 1 serving milk x 15 g = 15 g. ? 1 serving strawberries x 15 g = 15 g. 4. Add together all of the amounts to find the total grams of carbohydrates eaten: ? 30 g + 15 g + 15 g + 15 g = 75 g of carbohydrates total. This information is not intended to replace advice given to you by your health care provider. Make sure you discuss any questions you have with your health care provider. Document Released: 03/26/2005 Document Revised: 10/14/2015 Document Reviewed: 09/07/2015 Elsevier Interactive Patient Education  [employer].

## 2017-04-22 NOTE — Progress Notes (Signed)
Subjective:  I acted as a Neurosurgeon for [employer] Fuller Song, RMA   Patient ID: Andrea Bowman, female    DOB: 22-Nov-1960, 57 y.o.   MRN: [id number]  Chief Complaint  Patient presents with  . Rx refill    HPI  Patient is in today for Rx refill.she lives out of the country 1/2 the year in [place]. Is home and in need of refills the clonazepam helps with anxiety daily and rarely she uses it for sleep with good results. No recent febrile illness although she does note that her congestion returns when she returns to the states. Denies CP/palp/SOB/HA/fevers/GI or GU c/o. Taking meds as prescribed  Patient Care Team: Bradd Canary, MD as PCP - General (Family Medicine)   Past Medical History:  Diagnosis Date  . Acute bronchitis 08/24/2010  . Anxiety    panic attacks  . 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 Complication of anesthesia    hard to wake up post-op  . Dental bridge present    upper  . Dental infection    will finish antibiotic 05/01/2012  . Depression   . Insomnia 11/14/2014  . Osteoarthritis of knee    right  . Perimenopausal   . PVC (premature ventricular contraction)    takes Atenolol   . TOBACCO ABUSE 11/30/2009   Qualifier: Diagnosis of  By: Jens Som, MD, Lyn Hollingshead   . 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 CHOLECYSTECTOMY  2007  . KNEE ARTHROSCOPY WITH LATERAL RELEASE  05/02/2012   Procedure: KNEE ARTHROSCOPY WITH LATERAL RELEASE;  Surgeon: Harvie Junior, MD;  Location: [facility];  Service: Orthopedics;  Laterality: Right;  chondroplasty with lateral release    Family History  Problem Relation Age of Onset  . Cancer Mother 33       Breast  . Hypertension Father   . Heart failure Father        CHF  . Depression Sister        previous history of  . Anxiety disorder Sister        Previous history of  . Cancer Sister        cancer,  breast  . Depression Brother   . Anxiety disorder Brother   . Ulcers Brother        PUD  . Cancer Brother        skin  . Cancer Maternal Aunt        X several w/ breast cancer, various ages  . Cancer Paternal Uncle        X several w/ prostate cancer, 1 w/colon cancer  . Cancer Maternal Aunt        uterine  . Cancer Maternal Aunt        breast    Social History   Socioeconomic History  . Marital status: Married    Spouse name: Not on file  . Number of children: 1  . Years of education: Not on file  . Highest education level: Not on file  Social Needs  . Financial resource strain: Not on file  . Food insecurity - worry: Not on file  . Food insecurity - inability: Not on file  . Transportation needs - medical: Not on file  . Transportation needs - non-medical: Not on file  Occupational History    Comment: home  Tobacco Use  . Smoking status: Current Every Day Smoker  Years: 30.00    Types: Cigarettes  . Smokeless tobacco: Never Used  . Tobacco comment: 3 cig./day and several e-cigarettes/day  Substance and Sexual Activity  . Alcohol use: Yes    Comment: seldom  . Drug use: No  . Sexual activity: Yes    Partners: Male    Comment: is separted from husband, he is out of the country and seeing someone else  Other Topics Concern  . Not on file  Social History Narrative   Relocated from [place]. Lives with husband. Home maker.    Outpatient Medications Prior to Visit  Medication Sig Dispense Refill  . albuterol (PROVENTIL HFA;VENTOLIN HFA) 108 (90 Base) MCG/ACT inhaler Inhale 2 puffs into the lungs every 6 (six) hours as needed for wheezing or shortness of breath. 1 Inhaler 2  . atenolol (TENORMIN) 25 MG tablet TAKE 1 TABLET (25 MG TOTAL) BY MOUTH DAILY. 90 tablet 1  . cholecalciferol (VITAMIN D) 1000 units tablet Take 1,000 Units by mouth 2 (two) times daily.    Marland Kitchen. escitalopram (LEXAPRO) 20 MG tablet TAKE 2 TABLETS (40 MG TOTAL) BY MOUTH DAILY. (PA) 30 tablet 3  .  Esomeprazole Magnesium (NEXIUM PO) Take by mouth.    Marland Kitchen. ibuprofen (ADVIL,MOTRIN) 200 MG tablet Take 600 mg by mouth every 6 (six) hours as needed for pain.    . vitamin B-12 (CYANOCOBALAMIN) 1000 MCG tablet Take 1,000 mcg by mouth daily.    . Vitamin D, Ergocalciferol, (DRISDOL) 50000 units CAPS capsule Take 1 capsule (50,000 Units total) by mouth every 7 (seven) days. 12 capsule 0  . atenolol (TENORMIN) 25 MG tablet Take 1 tablet (25 mg total) by mouth daily. 90 tablet 1  . ciprofloxacin (CIPRO) 500 MG tablet Take 1 tablet (500 mg total) by mouth 2 (two) times daily. 28 tablet 0  . clonazePAM (KLONOPIN) 0.5 MG tablet Take 1 tablet (0.5 mg total) by mouth 3 (three) times daily as needed for anxiety. 90 tablet 0  . HYDROcodone-homatropine (HYCODAN) 5-1.5 MG/5ML syrup 1-2 tsp po qhs prn cough 150 mL 0  . PROAIR HFA 108 (90 Base) MCG/ACT inhaler INHALE 2 PUFFS INTO THE LUNGS EVERY 6 (SIX) HOURS AS NEEDED FOR WHEEZING OR SHORTNESS OF BREATH. 8.5 Inhaler 1  . ipratropium (ATROVENT HFA) 17 MCG/ACT inhaler Inhale 2 puffs into the lungs every 6 (six) hours as needed for wheezing. (Patient not taking: Reported on 04/22/2017) 1 Inhaler 1  . predniSONE (DELTASONE) 20 MG tablet 2 tabs po qd x 5d, then 1 tab po qd x 5d, then 1/2 tab po qd x 6d (Patient not taking: Reported on 04/22/2017) 18 tablet 0   No facility-administered medications prior to visit.     Allergies  Allergen Reactions  . Cefdinir Hives  . Doxycycline Diarrhea  . Prednisone Other (See Comments)    REACTION: REDNESS OF FACE    Review of Systems  Constitutional: Negative for fever and malaise/fatigue.  HENT: Positive for congestion.   Eyes: Negative for blurred vision.  Respiratory: Negative for shortness of breath.   Cardiovascular: Negative for chest pain, palpitations and leg swelling.  Gastrointestinal: Negative for abdominal pain, blood in stool and nausea.  Genitourinary: Negative for dysuria and frequency.  Musculoskeletal:  Negative for falls.  Skin: Negative for rash.  Neurological: Negative for dizziness, loss of consciousness and headaches.  Endo/Heme/Allergies: Negative for environmental allergies.  Psychiatric/Behavioral: Negative for depression. The patient is nervous/anxious and has insomnia.        Objective:    Physical  Exam  Constitutional: She is oriented to person, place, and time. She appears well-developed and well-nourished. No distress.  HENT:  Head: Normocephalic and atraumatic.  Nose: Nose normal.  Eyes: Right eye exhibits no discharge. Left eye exhibits no discharge.  Neck: Normal range of motion. Neck supple.  Cardiovascular: Normal rate and regular rhythm.  No murmur heard. Pulmonary/Chest: Effort normal and breath sounds normal.  Abdominal: Soft. Bowel sounds are normal. There is no tenderness.  Musculoskeletal: She exhibits no edema.  Neurological: She is alert and oriented to person, place, and time.  Skin: Skin is warm and dry.  Psychiatric: She has a normal mood and affect.  Nursing note and vitals reviewed.   BP 130/70 (BP Location: Left Arm, Patient Position: Sitting, Cuff Size: Normal)   Pulse 62   Temp 98.6 F (37 C) (Oral)   Resp 18   Ht 5\' 7"  (1.702 m)   Wt 181 lb 9.6 oz (82.4 kg)   SpO2 98%   BMI 28.44 kg/m  Wt Readings from Last 3 Encounters:  04/22/17 181 lb 9.6 oz (82.4 kg)  03/27/16 172 lb 6 oz (78.2 kg)  04/06/15 190 lb 8 oz (86.4 kg)   BP Readings from Last 3 Encounters:  04/22/17 130/70  03/27/16 120/78  04/06/15 122/85     Immunization History  Administered Date(s) Administered  . Influenza Split 01/08/2012  . Td 04/09/2005  . Tdap 03/27/2016    Health Maintenance  Topic Date Due  . PAP SMEAR  11/22/2015  . INFLUENZA VACCINE  12/23/2017 (Originally 11/07/2016)  . MAMMOGRAM  09/13/2017  . COLONOSCOPY  05/04/2025  . TETANUS/TDAP  03/27/2026  . Hepatitis C Screening  Completed  . HIV Screening  Completed    Lab Results  Component  Value Date   WBC 9.3 03/28/2016   HGB 13.9 03/28/2016   HCT 41.2 03/28/2016   PLT 289.0 03/28/2016   GLUCOSE 96 03/28/2016   CHOL 153 03/28/2016   TRIG 94.0 03/28/2016   HDL 39.10 03/28/2016   LDLDIRECT 144.2 01/30/2012   LDLCALC 95 03/28/2016   ALT 12 03/28/2016   AST 11 03/28/2016   NA 138 03/28/2016   K 4.7 03/28/2016   CL 104 03/28/2016   CREATININE 0.80 03/28/2016   BUN 13 03/28/2016   CO2 28 03/28/2016   TSH 1.27 03/28/2016   INR 1.0 06/16/2012   HGBA1C 5.9 03/28/2015    Lab Results  Component Value Date   TSH 1.27 03/28/2016   Lab Results  Component Value Date   WBC 9.3 03/28/2016   HGB 13.9 03/28/2016   HCT 41.2 03/28/2016   MCV 96.4 03/28/2016   PLT 289.0 03/28/2016   Lab Results  Component Value Date   NA 138 03/28/2016   K 4.7 03/28/2016   CO2 28 03/28/2016   GLUCOSE 96 03/28/2016   BUN 13 03/28/2016   CREATININE 0.80 03/28/2016   BILITOT 0.3 03/28/2016   ALKPHOS 61 03/28/2016   AST 11 03/28/2016   ALT 12 03/28/2016   PROT 6.9 03/28/2016   ALBUMIN 3.8 03/28/2016   CALCIUM 8.9 03/28/2016   GFR 78.43 03/28/2016   Lab Results  Component Value Date   CHOL 153 03/28/2016   Lab Results  Component Value Date   HDL 39.10 03/28/2016   Lab Results  Component Value Date   LDLCALC 95 03/28/2016   Lab Results  Component Value Date   TRIG 94.0 03/28/2016   Lab Results  Component Value Date   CHOLHDL 4 03/28/2016  Lab Results  Component Value Date   HGBA1C 5.9 03/28/2015         Assessment & Plan:   Problem List Items Addressed This Visit    ANXIETY DEPRESSION    Takes Clonopin one daily and infrequently qhs, maybe once a month and for events like flying.t tolerates well and no concernng side effects or concerns. Doing well on Lexapro 20 mg daily      Relevant Medications   clonazePAM (KLONOPIN) 0.5 MG tablet   TOBACCO ABUSE    Encouraged complete cessation. Discussed need to quit as relates to risk of numerous cancers, cardiac  and pulmonary disease as well as neurologic complications. Counseled for greater than 3 minutes      Vitamin D deficiency    Labs reveal deficiency. Start on Vitamin D 16109 IU caps, 1 cap po weekly x 12 weeks. Disp #4 with 4 rf. Also take daily Vitamin D over the counter. If already taking a daily supplement increase by 1000 IU daily and if not start Vitamin D 2000 IU daily. Recheck Vitamin D level      Relevant Orders   VITAMIN D 25 Hydroxy (Vit-D Deficiency, Fractures)   Hyperlipidemia    Encouraged heart healthy diet, increase exercise, avoid trans fats, consider a krill oil cap daily      Relevant Orders   Lipid panel   Hyperglycemia    hgba1c acceptable, minimize simple carbs. Increase exercise as tolerated.       Relevant Orders   Hemoglobin A1c   TSH   Insomnia    Encouraged good sleep hygiene such as dark, quiet room. No blue/green glowing lights such as computer screens in bedroom. No alcohol or stimulants in evening. Cut down on caffeine as able. Regular exercise is helpful but not just prior to bed time.        Other Visit Diagnoses    Nausea    -  Primary   Relevant Orders   CBC   Comprehensive metabolic panel   TSH      I have discontinued Raynelle Fanning A. Litaker's predniSONE, HYDROcodone-homatropine, PROAIR HFA, and ciprofloxacin. I am also having her maintain her ibuprofen, Esomeprazole Magnesium (NEXIUM PO), ipratropium, albuterol, Vitamin D (Ergocalciferol), atenolol, escitalopram, cholecalciferol, vitamin B-12, and clonazePAM.  Meds ordered this encounter  Medications  . DISCONTD: clonazePAM (KLONOPIN) 0.5 MG tablet    Sig: Take 1 tablet (0.5 mg total) by mouth 3 (three) times daily as needed for anxiety.    Dispense:  90 tablet    Refill:  2  . clonazePAM (KLONOPIN) 0.5 MG tablet    Sig: Take 1 tablet (0.5 mg total) by mouth 3 (three) times daily as needed for anxiety.    Dispense:  90 tablet    Refill:  2    CMA served as scribe during this visit.  History, Physical and Plan performed by medical provider. Documentation and orders reviewed and attested to.  Danise Edge, MD

## 2017-04-22 NOTE — Assessment & Plan Note (Signed)
Encouraged complete cessation. Discussed need to quit as relates to risk of numerous cancers, cardiac and pulmonary disease as well as neurologic complications. Counseled for greater than 3 minutes 

## 2017-04-30 ENCOUNTER — Other Ambulatory Visit: Payer: Self-pay | Admitting: Family Medicine

## 2017-04-30 DIAGNOSIS — Z1239 Encounter for other screening for malignant neoplasm of breast: Secondary | ICD-10-CM

## 2017-05-02 ENCOUNTER — Ambulatory Visit (INDEPENDENT_AMBULATORY_CARE_PROVIDER_SITE_OTHER): Payer: PRIVATE HEALTH INSURANCE

## 2017-05-02 DIAGNOSIS — Z1239 Encounter for other screening for malignant neoplasm of breast: Secondary | ICD-10-CM

## 2017-05-02 DIAGNOSIS — Z1231 Encounter for screening mammogram for malignant neoplasm of breast: Secondary | ICD-10-CM

## 2017-06-18 ENCOUNTER — Other Ambulatory Visit: Payer: Self-pay | Admitting: Family Medicine

## 2017-06-18 DIAGNOSIS — F419 Anxiety disorder, unspecified: Principal | ICD-10-CM

## 2017-06-18 DIAGNOSIS — F329 Major depressive disorder, single episode, unspecified: Secondary | ICD-10-CM

## 2017-07-18 ENCOUNTER — Other Ambulatory Visit: Payer: Self-pay

## 2017-07-18 NOTE — Telephone Encounter (Signed)
Received fax regarding refill for patients Escitalopram 90 day supply. Request not appropriate due to just sending in 90 day supply with one refill in march.

## 2017-08-21 MED FILL — clonazePAM 0.5 MG TABS: 0.5 | 30 days supply | Qty: 90 | Fill #1

## 2017-09-18 ENCOUNTER — Other Ambulatory Visit: Payer: Self-pay | Admitting: Family Medicine

## 2017-09-26 ENCOUNTER — Encounter: Payer: PRIVATE HEALTH INSURANCE | Admitting: Family Medicine

## 2017-10-28 ENCOUNTER — Telehealth: Payer: Self-pay | Admitting: Family Medicine

## 2017-10-28 ENCOUNTER — Encounter: Payer: Self-pay | Admitting: Family Medicine

## 2017-10-28 ENCOUNTER — Other Ambulatory Visit: Payer: Self-pay | Admitting: Family Medicine

## 2017-10-28 DIAGNOSIS — R739 Hyperglycemia, unspecified: Secondary | ICD-10-CM

## 2017-10-28 DIAGNOSIS — F341 Dysthymic disorder: Secondary | ICD-10-CM

## 2017-10-28 DIAGNOSIS — I10 Essential (primary) hypertension: Secondary | ICD-10-CM

## 2017-10-28 DIAGNOSIS — E782 Mixed hyperlipidemia: Secondary | ICD-10-CM

## 2017-10-28 DIAGNOSIS — F410 Panic disorder [episodic paroxysmal anxiety] without agoraphobia: Secondary | ICD-10-CM

## 2017-10-28 NOTE — Telephone Encounter (Signed)
Copied from CRM [id number]. Topic: Quick Communication - Rx Refill/Question >> Oct 28, 2017  5:37 PM Raquel SarnaHayes, Andrea G wrote: albuterol (PROVENTIL HFA;VENTOLIN HFA) 108 (90 Base) MCG/ACT inhaler clonazePAM (KLONOPIN) 0.5 MG tablet   Pt I needing refills and is going overseas and will not be back until the end of the year.  [facility] [place] - [id number] [place] [address] Phone: [phone] Fax: [phone]

## 2017-10-29 MED ORDER — ALBUTEROL SULFATE HFA 108 (90 BASE) MCG/ACT IN AERS: 2.0000 | INHALATION_SPRAY | Freq: Four times a day (QID) | RESPIRATORY_TRACT | 5 refills | Status: DC | PRN

## 2017-10-29 MED ORDER — IPRATROPIUM BROMIDE HFA 17 MCG/ACT IN AERS: 2.0000 | INHALATION_SPRAY | Freq: Four times a day (QID) | RESPIRATORY_TRACT | 5 refills | Status: DC | PRN

## 2017-10-29 MED FILL — clonazePAM 0.5 MG TABS: 0.5 | 30 days supply | Qty: 90 | Fill #0

## 2017-10-29 NOTE — Telephone Encounter (Signed)
I will provide one refill but she needs appt for more and a UDS and contract

## 2017-10-29 NOTE — Telephone Encounter (Signed)
Requesting:Klonopin Contract: no UDS:no Last OV:04/22/17 Next OV:not scheduled  Last Refill:04/22/17 #[id number] Database:   Please advise

## 2017-10-30 NOTE — Telephone Encounter (Signed)
Medication was sent to pharmacy on 10/29/17 with additional refills for the ATROVENT AND THE ALBUTEROL

## 2017-10-30 NOTE — Telephone Encounter (Signed)
Patient is requesting additional refills of Albuterol- she uses once/twice daily and Atrovent she uses as needed. Patient has picked up a 90 day supply of her Klonopin- she may need an additional 2 month supply if possible- she is aware that is a controled substance. Patient is leaving for [place]30[place]1 and will be returning in December before Christmas. Any assistance with her Rx will be appreciated.

## 2018-03-10 ENCOUNTER — Telehealth: Payer: Self-pay | Admitting: Family Medicine

## 2018-03-10 DIAGNOSIS — Z79899 Other long term (current) drug therapy: Secondary | ICD-10-CM

## 2018-03-10 DIAGNOSIS — F341 Dysthymic disorder: Secondary | ICD-10-CM

## 2018-03-11 NOTE — Telephone Encounter (Signed)
Requesting:klonopin Contract:no UDS:no Last OV:04/22/17 Next OV:not scheduled  Last Refill:10/29/17 #[id number] Database:   At last refill was advised she needed an appointment  Called patient left voicemail for patient to call the office to schedule.   Please advise

## 2018-03-12 MED FILL — clonazePAM 0.5 MG TABS: 0.5 | 30 days supply | Qty: 90 | Fill #0

## 2018-03-12 NOTE — Telephone Encounter (Signed)
Patient states she can not make appointment as she in in [place] until atleast the end of December.

## 2018-03-13 NOTE — Telephone Encounter (Signed)
Ok refill the meds she needs until she gets back

## 2018-03-13 NOTE — Telephone Encounter (Signed)
Please advise 

## 2018-03-14 NOTE — Telephone Encounter (Signed)
Medication Sent to pharmacy.

## 2018-04-14 ENCOUNTER — Other Ambulatory Visit: Payer: Self-pay | Admitting: Family Medicine

## 2018-04-21 ENCOUNTER — Other Ambulatory Visit: Payer: Self-pay | Admitting: Family Medicine

## 2018-04-21 DIAGNOSIS — Z1231 Encounter for screening mammogram for malignant neoplasm of breast: Secondary | ICD-10-CM

## 2018-05-07 ENCOUNTER — Ambulatory Visit (INDEPENDENT_AMBULATORY_CARE_PROVIDER_SITE_OTHER): Payer: PRIVATE HEALTH INSURANCE

## 2018-05-07 DIAGNOSIS — Z1231 Encounter for screening mammogram for malignant neoplasm of breast: Secondary | ICD-10-CM | POA: Diagnosis not present

## 2018-06-12 ENCOUNTER — Ambulatory Visit (HOSPITAL_BASED_OUTPATIENT_CLINIC_OR_DEPARTMENT_OTHER)
Admission: RE | Admit: 2018-06-12 | Discharge: 2018-06-12 | Disposition: A | Discharge status: Home / Self Care | Payer: 59 | Source: Ambulatory Visit | Attending: Family Medicine | Admitting: Family Medicine

## 2018-06-12 ENCOUNTER — Ambulatory Visit (INDEPENDENT_AMBULATORY_CARE_PROVIDER_SITE_OTHER): Payer: PRIVATE HEALTH INSURANCE | Admitting: Family Medicine

## 2018-06-12 ENCOUNTER — Encounter: Payer: Self-pay | Admitting: Family Medicine

## 2018-06-12 VITALS — BP 122/82 | HR 60 | Temp 98.2°F | Resp 18 | Ht 67.0 in | Wt 193.2 lb

## 2018-06-12 DIAGNOSIS — R739 Hyperglycemia, unspecified: Secondary | ICD-10-CM | POA: Diagnosis not present

## 2018-06-12 DIAGNOSIS — F341 Dysthymic disorder: Secondary | ICD-10-CM

## 2018-06-12 DIAGNOSIS — Z0000 Encounter for general adult medical examination without abnormal findings: Secondary | ICD-10-CM

## 2018-06-12 DIAGNOSIS — M546 Pain in thoracic spine: Secondary | ICD-10-CM

## 2018-06-12 DIAGNOSIS — Z124 Encounter for screening for malignant neoplasm of cervix: Secondary | ICD-10-CM | POA: Diagnosis not present

## 2018-06-12 DIAGNOSIS — E785 Hyperlipidemia, unspecified: Secondary | ICD-10-CM

## 2018-06-12 DIAGNOSIS — F329 Major depressive disorder, single episode, unspecified: Secondary | ICD-10-CM

## 2018-06-12 DIAGNOSIS — E559 Vitamin D deficiency, unspecified: Secondary | ICD-10-CM | POA: Diagnosis not present

## 2018-06-12 DIAGNOSIS — E2839 Other primary ovarian failure: Secondary | ICD-10-CM

## 2018-06-12 DIAGNOSIS — Z780 Asymptomatic menopausal state: Secondary | ICD-10-CM

## 2018-06-12 DIAGNOSIS — F32A Depression, unspecified: Secondary | ICD-10-CM

## 2018-06-12 DIAGNOSIS — G8929 Other chronic pain: Secondary | ICD-10-CM | POA: Insufficient documentation

## 2018-06-12 DIAGNOSIS — R059 Cough, unspecified: Secondary | ICD-10-CM

## 2018-06-12 DIAGNOSIS — Z79899 Other long term (current) drug therapy: Secondary | ICD-10-CM

## 2018-06-12 DIAGNOSIS — R05 Cough: Secondary | ICD-10-CM

## 2018-06-12 DIAGNOSIS — N959 Unspecified menopausal and perimenopausal disorder: Secondary | ICD-10-CM | POA: Diagnosis not present

## 2018-06-12 DIAGNOSIS — F17200 Nicotine dependence, unspecified, uncomplicated: Secondary | ICD-10-CM

## 2018-06-12 DIAGNOSIS — L578 Other skin changes due to chronic exposure to nonionizing radiation: Secondary | ICD-10-CM | POA: Diagnosis not present

## 2018-06-12 DIAGNOSIS — F419 Anxiety disorder, unspecified: Secondary | ICD-10-CM

## 2018-06-12 DIAGNOSIS — B001 Herpesviral vesicular dermatitis: Secondary | ICD-10-CM

## 2018-06-12 MED ORDER — IPRATROPIUM BROMIDE HFA 17 MCG/ACT IN AERS: 2.0000 | INHALATION_SPRAY | Freq: Four times a day (QID) | RESPIRATORY_TRACT | 5 refills | Status: AC | PRN

## 2018-06-12 MED ORDER — CLONAZEPAM 0.5 MG PO TABS: ORAL_TABLET | ORAL | 0 refills | Status: DC

## 2018-06-12 MED ORDER — ATENOLOL 25 MG PO TABS: 25.0000 mg | ORAL_TABLET | Freq: Every day | ORAL | 1 refills | Status: DC

## 2018-06-12 MED ORDER — ESCITALOPRAM OXALATE 20 MG PO TABS: ORAL_TABLET | ORAL | 1 refills | Status: DC

## 2018-06-12 MED ORDER — ACYCLOVIR 5 % EX CREA: 1.0000 "application " | TOPICAL_CREAM | CUTANEOUS | 2 refills | Status: AC

## 2018-06-12 MED FILL — ACYCLOVIR 5% OINTMENT: 5 | 10 days supply | Qty: 15 | Fill #0

## 2018-06-12 MED FILL — clonazePAM 0.5 MG TABS: 0.5 | 30 days supply | Qty: 90 | Fill #0

## 2018-06-12 NOTE — Assessment & Plan Note (Signed)
Encouraged heart healthy diet, increase exercise, avoid trans fats, consider a krill oil cap daily 

## 2018-06-12 NOTE — Assessment & Plan Note (Signed)
Supplement and monitor 

## 2018-06-12 NOTE — Assessment & Plan Note (Signed)
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 Doing well on escitalopram

## 2018-06-12 NOTE — Patient Instructions (Addendum)
shingrix is the new shingles shot 2 shots over 2-6 months, call insurance and confirm coverage and document then call for nurse appt to get shot  Preventive Care 40-64 Years, Female Preventive care refers to lifestyle choices and visits with your health care provider that can promote health and wellness. What does preventive care include?   A yearly physical exam. This is also called an annual well check.  Dental exams once or twice a year.  Routine eye exams. Ask your health care provider how often you should have your eyes checked.  Personal lifestyle choices, including: ? Daily care of your teeth and gums. ? Regular physical activity. ? Eating a healthy diet. ? Avoiding tobacco and drug use. ? Limiting alcohol use. ? Practicing safe sex. ? Taking low-dose aspirin daily starting at age 2. ? Taking vitamin and mineral supplements as recommended by your health care provider. What happens during an annual well check? The services and screenings done by your health care provider during your annual well check will depend on your age, overall health, lifestyle risk factors, and family history of disease. Counseling Your health care provider may ask you questions about your:  Alcohol use.  Tobacco use.  Drug use.  Emotional well-being.  Home and relationship well-being.  Sexual activity.  Eating habits.  Work and work Statistician.  Method of birth control.  Menstrual cycle.  Pregnancy history. Screening You may have the following tests or measurements:  Height, weight, and BMI.  Blood pressure.  Lipid and cholesterol levels. These may be checked every 5 years, or more frequently if you are over 27 years old.  Skin check.  Lung cancer screening. You may have this screening every year starting at age 70 if you have a 30-pack-year history of smoking and currently smoke or have quit within the past 15 years.  Colorectal cancer screening. All adults should have  this screening starting at age 69 and continuing until age 29. Your health care provider may recommend screening at age 23. You will have tests every 1-10 years, depending on your results and the type of screening test. People at increased risk should start screening at an earlier age. Screening tests may include: ? Guaiac-based fecal occult blood testing. ? Fecal immunochemical test (FIT). ? Stool DNA test. ? Virtual colonoscopy. ? Sigmoidoscopy. During this test, a flexible tube with a tiny camera (sigmoidoscope) is used to examine your rectum and lower colon. The sigmoidoscope is inserted through your anus into your rectum and lower colon. ? Colonoscopy. During this test, a long, thin, flexible tube with a tiny camera (colonoscope) is used to examine your entire colon and rectum.  Hepatitis C blood test.  Hepatitis B blood test.  Sexually transmitted disease (STD) testing.  Diabetes screening. This is done by checking your blood sugar (glucose) after you have not eaten for a while (fasting). You may have this done every 1-3 years.  Mammogram. This may be done every 1-2 years. Talk to your health care provider about when you should start having regular mammograms. This may depend on whether you have a family history of breast cancer.  BRCA-related cancer screening. This may be done if you have a family history of breast, ovarian, tubal, or peritoneal cancers.  Pelvic exam and Pap test. This may be done every 3 years starting at age 11. Starting at age 41, this may be done every 5 years if you have a Pap test in combination with an HPV test.  Bone density  scan. This is done to screen for osteoporosis. You may have this scan if you are at high risk for osteoporosis. Discuss your test results, treatment options, and if necessary, the need for more tests with your health care provider. Vaccines Your health care provider may recommend certain vaccines, such as:  Influenza vaccine. This is  recommended every year.  Tetanus, diphtheria, and acellular pertussis (Tdap, Td) vaccine. You may need a Td booster every 10 years.  Varicella vaccine. You may need this if you have not been vaccinated.  Zoster vaccine. You may need this after age 24.  Measles, mumps, and rubella (MMR) vaccine. You may need at least one dose of MMR if you were born in 1957 or later. You may also need a second dose.  Pneumococcal 13-valent conjugate (PCV13) vaccine. You may need this if you have certain conditions and were not previously vaccinated.  Pneumococcal polysaccharide (PPSV23) vaccine. You may need one or two doses if you smoke cigarettes or if you have certain conditions.  Meningococcal vaccine. You may need this if you have certain conditions.  Hepatitis A vaccine. You may need this if you have certain conditions or if you travel or work in places where you may be exposed to hepatitis A.  Hepatitis B vaccine. You may need this if you have certain conditions or if you travel or work in places where you may be exposed to hepatitis B.  Haemophilus influenzae type b (Hib) vaccine. You may need this if you have certain conditions. Talk to your health care provider about which screenings and vaccines you need and how often you need them. This information is not intended to replace advice given to you by your health care provider. Make sure you discuss any questions you have with your health care provider. Document Released: 04/22/2015 Document Revised: 05/16/2017 Document Reviewed: 01/25/2015 Elsevier Interactive Patient Education  2019 [employer].

## 2018-06-12 NOTE — Assessment & Plan Note (Addendum)
Patient encouraged to maintain heart healthy diet, regular exercise, adequate sleep. Consider daily probiotics. Take medications as prescribed. Labs and reviewed

## 2018-06-12 NOTE — Assessment & Plan Note (Signed)
hgba1c acceptable, minimize simple carbs. Increase exercise as tolerated.  

## 2018-06-12 NOTE — Assessment & Plan Note (Addendum)
1/2 ppd encouraged complete cessation she declines assistance at this time. Agrees to proceed with low dose CT scan. Understands that it is an annual scan

## 2018-06-13 ENCOUNTER — Other Ambulatory Visit: Payer: Self-pay | Admitting: Family Medicine

## 2018-06-13 ENCOUNTER — Ambulatory Visit (HOSPITAL_BASED_OUTPATIENT_CLINIC_OR_DEPARTMENT_OTHER)
Admission: RE | Admit: 2018-06-13 | Discharge: 2018-06-13 | Disposition: A | Discharge status: Home / Self Care | Payer: PRIVATE HEALTH INSURANCE | Source: Ambulatory Visit | Attending: Family Medicine | Admitting: Family Medicine

## 2018-06-13 ENCOUNTER — Encounter: Payer: Self-pay | Admitting: Family Medicine

## 2018-06-13 DIAGNOSIS — Z780 Asymptomatic menopausal state: Secondary | ICD-10-CM | POA: Insufficient documentation

## 2018-06-13 DIAGNOSIS — E2839 Other primary ovarian failure: Secondary | ICD-10-CM | POA: Diagnosis present

## 2018-06-13 MED ORDER — TIZANIDINE HCL 4 MG PO TABS: 2.0000 mg | ORAL_TABLET | Freq: Three times a day (TID) | ORAL | 1 refills | Status: DC | PRN

## 2018-06-13 MED FILL — tiZANidine HCL 4 MG TABS: 4 | 30 days supply | Qty: 90 | Fill #0

## 2018-06-15 DIAGNOSIS — M549 Dorsalgia, unspecified: Secondary | ICD-10-CM | POA: Insufficient documentation

## 2018-06-15 NOTE — Progress Notes (Signed)
Subjective:    Patient ID: Andrea Bowman, female    DOB: 11-19-1960, 58 y.o.   MRN: [id number]  No chief complaint on file.   HPI Patient is in today for annual preventative exam and follow up on chronic concerns. She feels well today. She spends most of the year in [place] and is home to visit her daughter and grand child. She is struggling with intermittent mid back pain. Worse with prolonged use or standing. No recent fall or trauma. No recent febrile illness or hospitalizations. She continues to smoke 1/2 ppd and does not feel ready to quit. She is staying active and tries to maintain a heart healthy diet. Is managing her activities of daily living. Denies CP/palp/SOB/HA/congestion/fevers/GI or GU c/o. Taking meds as prescribed  Past Medical History:  Diagnosis Date  . Acute bronchitis 08/24/2010  . Anxiety    panic attacks  . 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 Complication of anesthesia    hard to wake up post-op  . Dental bridge present    upper  . Dental infection    will finish antibiotic 05/01/2012  . Depression   . Insomnia 11/14/2014  . Osteoarthritis of knee    right  . Perimenopausal   . PVC (premature ventricular contraction)    takes Atenolol   . TOBACCO ABUSE 11/30/2009   Qualifier: Diagnosis of  By: Jens Som, MD, Lyn Hollingshead   . 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 CHOLECYSTECTOMY  2007  . KNEE ARTHROSCOPY WITH LATERAL RELEASE  05/02/2012   Procedure: KNEE ARTHROSCOPY WITH LATERAL RELEASE;  Surgeon: Harvie Junior, MD;  Location: [facility];  Service: Orthopedics;  Laterality: Right;  chondroplasty with lateral release    Family History  Problem Relation Age of Onset  . Cancer Mother 65       Breast  . Hypertension Father   . Heart failure Father        CHF  . Depression Sister        previous history of  . Anxiety disorder  Sister        Previous history of  . Cancer Sister        cancer, breast  . Depression Brother   . Anxiety disorder Brother   . Ulcers Brother        PUD  . Cancer Brother        skin  . Cancer Maternal Aunt        X several w/ breast cancer, various ages  . Cancer Paternal Uncle        X several w/ prostate cancer, 1 w/colon cancer  . Cancer Maternal Aunt        uterine  . Cancer Maternal Aunt        breast    Social History   Socioeconomic History  . Marital status: Married    Spouse name: Not on file  . Number of children: 1  . Years of education: Not on file  . Highest education level: Not on file  Occupational History    Comment: home  Social Needs  . Financial resource strain: Not on file  . Food insecurity:    Worry: Not on file    Inability: Not on file  . Transportation needs:    Medical: Not on file    Non-medical: Not on file  Tobacco Use  . Smoking status:  Current Every Day Smoker    Years: 30.00    Types: Cigarettes  . Smokeless tobacco: Never Used  . Tobacco comment: 3 cig./day and several e-cigarettes/day  Substance and Sexual Activity  . Alcohol use: Yes    Comment: seldom  . Drug use: No  . Sexual activity: Yes    Partners: Male    Comment: is separted from husband, he is out of the country and seeing someone else  Lifestyle  . Physical activity:    Days per week: Not on file    Minutes per session: Not on file  . Stress: Not on file  Relationships  . Social connections:    Talks on phone: Not on file    Gets together: Not on file    Attends religious service: Not on file    Active member of club or organization: Not on file    Attends meetings of clubs or organizations: Not on file    Relationship status: Not on file  . Intimate partner violence:    Fear of current or ex partner: Not on file    Emotionally abused: Not on file    Physically abused: Not on file    Forced sexual activity: Not on file  Other Topics Concern  . Not on  file  Social History Narrative   Relocated from [place]. Lives with husband. Home maker.    Outpatient Medications Prior to Visit  Medication Sig Dispense Refill  . albuterol (PROVENTIL HFA;VENTOLIN HFA) 108 (90 Base) MCG/ACT inhaler Inhale 2 puffs into the lungs every 6 (six) hours as needed for wheezing or shortness of breath. 18 g 5  . cholecalciferol (VITAMIN D) 1000 units tablet Take 1,000 Units by mouth 2 (two) times daily.    . Esomeprazole Magnesium (NEXIUM PO) Take by mouth.    Marland Kitchen. ibuprofen (ADVIL,MOTRIN) 200 MG tablet Take 600 mg by mouth every 6 (six) hours as needed for pain.    . vitamin B-12 (CYANOCOBALAMIN) 1000 MCG tablet Take 1,000 mcg by mouth daily.    Marland Kitchen. atenolol (TENORMIN) 25 MG tablet TAKE 1 TABLET (25 MG TOTAL) BY MOUTH DAILY. 90 tablet 1  . clonazePAM (KLONOPIN) 0.5 MG tablet TAKE 1 TABLET BY MOUTH THREE TIMES A DAY AS NEEDED FOR ANXIETY 90 tablet 0  . escitalopram (LEXAPRO) 20 MG tablet TAKE 2 TABLETS (40 MG TOTAL) BY MOUTH DAILY. (PA) 90 tablet 1  . ipratropium (ATROVENT HFA) 17 MCG/ACT inhaler Inhale 2 puffs into the lungs every 6 (six) hours as needed for wheezing. 12.9 g 5  . Vitamin D, Ergocalciferol, (DRISDOL) 50000 units CAPS capsule Take 1 capsule (50,000 Units total) by mouth every 7 (seven) days. 12 capsule 0   No facility-administered medications prior to visit.     Allergies  Allergen Reactions  . Cefdinir Hives  . Doxycycline Diarrhea  . Prednisone Other (See Comments)    REACTION: REDNESS OF FACE    Review of Systems  Constitutional: Negative for chills, fever and malaise/fatigue.  HENT: Negative for congestion and hearing loss.   Eyes: Negative for discharge.  Respiratory: Negative for cough, sputum production and shortness of breath.   Cardiovascular: Negative for chest pain, palpitations and leg swelling.  Gastrointestinal: Negative for abdominal pain, blood in stool, constipation, diarrhea, heartburn, nausea and vomiting.  Genitourinary:  Negative for dysuria, frequency, hematuria and urgency.  Musculoskeletal: Positive for back pain. Negative for falls and myalgias.  Skin: Negative for rash.  Neurological: Negative for dizziness, sensory change, loss of consciousness,  weakness and headaches.  Endo/Heme/Allergies: Negative for environmental allergies. Does not bruise/bleed easily.  Psychiatric/Behavioral: Negative for depression and suicidal ideas. The patient is nervous/anxious. The patient does not have insomnia.        Objective:    Physical Exam Constitutional:      General: She is not in acute distress.    Appearance: She is not diaphoretic.  HENT:     Head: Normocephalic and atraumatic.     Right Ear: External ear normal.     Left Ear: External ear normal.     Nose: Nose normal.     Mouth/Throat:     Pharynx: No oropharyngeal exudate.  Eyes:     General: No scleral icterus.       Right eye: No discharge.        Left eye: No discharge.     Conjunctiva/sclera: Conjunctivae normal.     Pupils: Pupils are equal, round, and reactive to light.  Neck:     Musculoskeletal: Normal range of motion and neck supple.     Thyroid: No thyromegaly.  Cardiovascular:     Rate and Rhythm: Normal rate and regular rhythm.     Heart sounds: Normal heart sounds. No murmur.  Pulmonary:     Effort: Pulmonary effort is normal. No respiratory distress.     Breath sounds: Normal breath sounds. No wheezing or rales.  Abdominal:     General: Bowel sounds are normal. There is no distension.     Palpations: Abdomen is soft. There is no mass.     Tenderness: There is no abdominal tenderness.  Musculoskeletal: Normal range of motion.        General: No tenderness.  Lymphadenopathy:     Cervical: No cervical adenopathy.  Skin:    General: Skin is warm and dry.     Findings: No rash.  Neurological:     Mental Status: She is alert and oriented to person, place, and time.     Cranial Nerves: No cranial nerve deficit.      Coordination: Coordination normal.     Deep Tendon Reflexes: Reflexes are normal and symmetric. Reflexes normal.     BP 122/82 (BP Location: Left Arm, Patient Position: Sitting, Cuff Size: Normal)   Pulse 60   Temp 98.2 F (36.8 C) (Oral)   Resp 18   Ht 5\' 7"  (1.702 m)   Wt 193 lb 3.2 oz (87.6 kg)   SpO2 97%   BMI 30.26 kg/m  Wt Readings from Last 3 Encounters:  06/12/18 193 lb 3.2 oz (87.6 kg)  04/22/17 181 lb 9.6 oz (82.4 kg)  03/27/16 172 lb 6 oz (78.2 kg)     Lab Results  Component Value Date   WBC 7.6 04/22/2017   HGB 14.6 04/22/2017   HCT 43.7 04/22/2017   PLT 234.0 04/22/2017   GLUCOSE 84 04/22/2017   CHOL 185 04/22/2017   TRIG 94.0 04/22/2017   HDL 61.60 04/22/2017   LDLDIRECT 144.2 01/30/2012   LDLCALC 105 (H) 04/22/2017   ALT 16 04/22/2017   AST 16 04/22/2017   NA 140 04/22/2017   K 4.7 04/22/2017   CL 105 04/22/2017   CREATININE 0.75 04/22/2017   BUN 11 04/22/2017   CO2 30 04/22/2017   TSH 2.02 04/22/2017   INR 1.0 06/16/2012   HGBA1C 5.8 04/22/2017    Lab Results  Component Value Date   TSH 2.02 04/22/2017   Lab Results  Component Value Date   WBC 7.6 04/22/2017  HGB 14.6 04/22/2017   HCT 43.7 04/22/2017   MCV 99.0 04/22/2017   PLT 234.0 04/22/2017   Lab Results  Component Value Date   NA 140 04/22/2017   K 4.7 04/22/2017   CO2 30 04/22/2017   GLUCOSE 84 04/22/2017   BUN 11 04/22/2017   CREATININE 0.75 04/22/2017   BILITOT 0.4 04/22/2017   ALKPHOS 50 04/22/2017   AST 16 04/22/2017   ALT 16 04/22/2017   PROT 6.9 04/22/2017   ALBUMIN 4.1 04/22/2017   CALCIUM 9.5 04/22/2017   GFR 84.18 04/22/2017   Lab Results  Component Value Date   CHOL 185 04/22/2017   Lab Results  Component Value Date   HDL 61.60 04/22/2017   Lab Results  Component Value Date   LDLCALC 105 (H) 04/22/2017   Lab Results  Component Value Date   TRIG 94.0 04/22/2017   Lab Results  Component Value Date   CHOLHDL 3 04/22/2017   Lab Results    Component Value Date   HGBA1C 5.8 04/22/2017       Assessment & Plan:   Problem List Items Addressed This Visit    ANXIETY DEPRESSION    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 Doing well on escitalopram      Relevant Medications   escitalopram (LEXAPRO) 20 MG tablet   clonazePAM (KLONOPIN) 0.5 MG tablet   TOBACCO ABUSE    1/2 ppd encouraged complete cessation she declines assistance at this time.      Vitamin D deficiency    Supplement and monitor      Relevant Orders   VITAMIN D 25 Hydroxy (Vit-D Deficiency, Fractures)   Menopausal and postmenopausal disorder    Dexa scan run due to history of vertebral fracture but her scan was normal.      Herpes labialis   Relevant Medications   acyclovir cream (ZOVIRAX) 5 %   Hyperlipidemia    Encouraged heart healthy diet, increase exercise, avoid trans fats, consider a krill oil cap daily      Relevant Medications   atenolol (TENORMIN) 25 MG tablet   Other Relevant Orders   Lipid panel   TSH   Preventative health care    Patient encouraged to maintain heart healthy diet, regular exercise, adequate sleep. Consider daily probiotics. Take medications as prescribed. Labs and reviewed      Hyperglycemia    hgba1c acceptable, minimize simple carbs. Increase exercise as tolerated.      Relevant Orders   Hemoglobin A1c   Comprehensive metabolic panel   TSH   Cough   Relevant Orders   CBC   DG Chest 2 View (Completed)   Back pain    Burns and hurts with prolonged activity and standing. Allowed a course of Tizanidine to see if that is helpful. Encouraged moist heat and gentle stretching as tolerated. May try NSAIDs and prescription meds as directed and report if symptoms worsen or seek immediate care      Relevant Orders   DG Lumbar Spine Complete (Completed)    Other Visit Diagnoses    High risk medication use    -  Primary   Relevant Orders   Pain  Mgmt, Profile 8 w/Conf, U   Pain Mgmt, Profile 8 w/Conf, U   Anxiety and depression       Relevant Medications   escitalopram (LEXAPRO) 20 MG tablet   Estrogen deficiency  Relevant Orders   DG Bone Density (Completed)   Post-menopausal       Relevant Orders   DG Bone Density (Completed)   Cervical cancer screening       Relevant Orders   Ambulatory referral to Obstetrics / Gynecology   Sun-damaged skin       Relevant Orders   Ambulatory referral to Dermatology      I have discontinued Raynelle Fanning A. Gunner's Vitamin D (Ergocalciferol). I am also having her maintain her ibuprofen, Esomeprazole Magnesium (NEXIUM PO), cholecalciferol, vitamin B-12, albuterol, escitalopram, ipratropium, atenolol, clonazePAM, and acyclovir cream.  Meds ordered this encounter  Medications  . escitalopram (LEXAPRO) 20 MG tablet    Sig: TAKE 2 TABLETS (40 MG TOTAL) BY MOUTH DAILY. (PA)    Dispense:  90 tablet    Refill:  1  . ipratropium (ATROVENT HFA) 17 MCG/ACT inhaler    Sig: Inhale 2 puffs into the lungs every 6 (six) hours as needed for wheezing.    Dispense:  12.9 g    Refill:  5  . atenolol (TENORMIN) 25 MG tablet    Sig: Take 1 tablet (25 mg total) by mouth daily.    Dispense:  90 tablet    Refill:  1  . clonazePAM (KLONOPIN) 0.5 MG tablet    Sig: TAKE 1 TABLET BY MOUTH THREE TIMES A DAY AS NEEDED FOR ANXIETY    Dispense:  90 tablet    Refill:  0  . acyclovir cream (ZOVIRAX) 5 %    Sig: Apply 1 application topically every 3 (three) hours.    Dispense:  5 g    Refill:  2     Danise Edge, MD

## 2018-06-15 NOTE — Assessment & Plan Note (Signed)
Dexa scan run due to history of vertebral fracture but her scan was normal.

## 2018-06-15 NOTE — Assessment & Plan Note (Signed)
Burns and hurts with prolonged activity and standing. Allowed a course of Tizanidine to see if that is helpful. Encouraged moist heat and gentle stretching as tolerated. May try NSAIDs and prescription meds as directed and report if symptoms worsen or seek immediate care

## 2018-06-16 ENCOUNTER — Other Ambulatory Visit: Payer: PRIVATE HEALTH INSURANCE

## 2018-06-19 ENCOUNTER — Other Ambulatory Visit: Payer: PRIVATE HEALTH INSURANCE

## 2018-07-09 ENCOUNTER — Other Ambulatory Visit: Payer: Self-pay | Admitting: Family Medicine

## 2018-07-09 DIAGNOSIS — F419 Anxiety disorder, unspecified: Principal | ICD-10-CM

## 2018-07-09 DIAGNOSIS — F329 Major depressive disorder, single episode, unspecified: Secondary | ICD-10-CM

## 2018-07-29 ENCOUNTER — Ambulatory Visit (INDEPENDENT_AMBULATORY_CARE_PROVIDER_SITE_OTHER): Payer: PRIVATE HEALTH INSURANCE | Admitting: Family Medicine

## 2018-07-29 ENCOUNTER — Encounter: Payer: Self-pay | Admitting: Family Medicine

## 2018-07-29 ENCOUNTER — Other Ambulatory Visit: Payer: Self-pay

## 2018-07-29 DIAGNOSIS — R300 Dysuria: Secondary | ICD-10-CM | POA: Diagnosis not present

## 2018-07-29 MED ORDER — PHENAZOPYRIDINE HCL 200 MG PO TABS: 200.0000 mg | ORAL_TABLET | Freq: Three times a day (TID) | ORAL | 0 refills | Status: DC | PRN

## 2018-07-29 MED ORDER — NITROFURANTOIN MONOHYD MACRO 100 MG PO CAPS: 100.0000 mg | ORAL_CAPSULE | Freq: Two times a day (BID) | ORAL | 0 refills | Status: DC

## 2018-07-29 NOTE — Progress Notes (Signed)
Virtual Visit via Video Note  I connected with Andrea Bowman on 07/29/18 at 11:00 AM EDT by a video enabled telemedicine application and verified that I am speaking with the correct person using two identifiers.   I discussed the limitations of evaluation and management by telemedicine and the availability of in person appointments. The patient expressed understanding and agreed to proceed.  History of Present Illness: Pt is home c/o uti symptoms   Pt has a lot of urgency and frequency and dysuria   Pt has a hx of uti and it feels the same.  No fevers ,  No abd pain or flank pain.     Observations/Objective: Unable to get  Vs due to web visit   Assessment and Plan: 1. Dysuria abx and pyridium per orders If no better pt agrees to come in to leave a urine for ua and culture - nitrofurantoin, macrocrystal-monohydrate, (MACROBID) 100 MG capsule; Take 1 capsule (100 mg total) by mouth 2 (two) times daily.  Dispense: 14 capsule; Refill: 0 - phenazopyridine (PYRIDIUM) 200 MG tablet; Take 1 tablet (200 mg total) by mouth 3 (three) times daily as needed for pain.  Dispense: 6 tablet; Refill: 0   Follow Up Instructions:    I discussed the assessment and treatment plan with the patient. The patient was provided an opportunity to ask questions and all were answered. The patient agreed with the plan and demonstrated an understanding of the instructions.   The patient was advised to call back or seek an in-person evaluation if the symptoms worsen or if the condition fails to improve as anticipated.  I provided15 minutes of non-face-to-face time during this encounter.   Donato Schultz, DO

## 2018-07-29 NOTE — Telephone Encounter (Signed)
Made appt with Dr. Laury Axon

## 2018-07-31 ENCOUNTER — Ambulatory Visit: Payer: Self-pay | Admitting: Family Medicine

## 2018-07-31 ENCOUNTER — Other Ambulatory Visit: Payer: Self-pay

## 2018-07-31 ENCOUNTER — Ambulatory Visit (INDEPENDENT_AMBULATORY_CARE_PROVIDER_SITE_OTHER): Payer: PRIVATE HEALTH INSURANCE | Admitting: Family Medicine

## 2018-07-31 VITALS — Wt 190.0 lb

## 2018-07-31 DIAGNOSIS — R6889 Other general symptoms and signs: Secondary | ICD-10-CM | POA: Diagnosis not present

## 2018-07-31 DIAGNOSIS — Z20822 Contact with and (suspected) exposure to covid-19: Secondary | ICD-10-CM

## 2018-07-31 DIAGNOSIS — J989 Respiratory disorder, unspecified: Secondary | ICD-10-CM

## 2018-07-31 DIAGNOSIS — E559 Vitamin D deficiency, unspecified: Secondary | ICD-10-CM | POA: Diagnosis not present

## 2018-07-31 DIAGNOSIS — R509 Fever, unspecified: Secondary | ICD-10-CM

## 2018-07-31 DIAGNOSIS — F410 Panic disorder [episodic paroxysmal anxiety] without agoraphobia: Secondary | ICD-10-CM | POA: Diagnosis not present

## 2018-07-31 DIAGNOSIS — F17200 Nicotine dependence, unspecified, uncomplicated: Secondary | ICD-10-CM | POA: Diagnosis not present

## 2018-07-31 MED ORDER — ALBUTEROL SULFATE HFA 108 (90 BASE) MCG/ACT IN AERS: 2.0000 | INHALATION_SPRAY | Freq: Four times a day (QID) | RESPIRATORY_TRACT | 0 refills | Status: DC | PRN

## 2018-07-31 MED ORDER — HYDROCODONE-HOMATROPINE 5-1.5 MG/5ML PO SYRP: 5.0000 mL | ORAL_SOLUTION | Freq: Four times a day (QID) | ORAL | 0 refills | Status: DC | PRN

## 2018-07-31 NOTE — Progress Notes (Signed)
Currently having; cough, body aches, chest congestion,  fever 100.5 last night, no fever now, starting to eat better now.

## 2018-07-31 NOTE — Telephone Encounter (Signed)
Made appt w/ PCP for today

## 2018-07-31 NOTE — Telephone Encounter (Signed)
Pt. Reports during the night she developed a cough, some shortness of breath and temp. Of 100.5. Feels better this morning - temp. 99. Still " a little short of breath.I have an inhaler I use."  Concerned about corona virus. Warm transfer to [facility] in the practice for possible virtual visit. Answer Assessment - Initial Assessment Questions 1. ONSET: "When did the cough begin?"      Started last night 2. SEVERITY: "How bad is the cough today?"      Moderate 3. RESPIRATORY DISTRESS: "Describe your breathing."      Some shortness of breath 4. FEVER: "Do you have a fever?" If so, ask: "What is your temperature, how was it measured, and when did it start?"     Had temp. Last night 100.5 5. HEMOPTYSIS: "Are you coughing up any blood?" If so ask: "How much?" (flecks, streaks, tablespoons, etc.)     No 6. TREATMENT: "What have you done so far to treat the cough?" (e.g., meds, fluids, humidifier)     Used an inhaler 7. CARDIAC HISTORY: "Do you have any history of heart disease?" (e.g., heart attack, congestive heart failure)      No 8. LUNG HISTORY: "Do you have any history of lung disease?"  (e.g., pulmonary embolus, asthma, emphysema)     H/o bronchitis, COPD 9. PE RISK FACTORS: "Do you have a history of blood clots?" (or: recent major surgery, recent prolonged travel, bedridden)     No 10. OTHER SYMPTOMS: "Do you have any other symptoms? (e.g., runny nose, wheezing, chest pain)       No 11. PREGNANCY: "Is there any chance you are pregnant?" "When was your last menstrual period?"       No 12. TRAVEL: "Have you traveled out of the country in the last month?" (e.g., travel history, exposures)       No  Protocols used: COUGH - ACUTE NON-PRODUCTIVE-A-AH

## 2018-08-02 ENCOUNTER — Encounter: Payer: Self-pay | Admitting: Family Medicine

## 2018-08-03 DIAGNOSIS — R509 Fever, unspecified: Secondary | ICD-10-CM

## 2018-08-03 DIAGNOSIS — J989 Respiratory disorder, unspecified: Secondary | ICD-10-CM | POA: Insufficient documentation

## 2018-08-03 NOTE — Progress Notes (Signed)
Virtual Visit via Video Note  I connected with Andrea ShadeJulie A Handlin on 08/03/18 at  2:20 PM EDT by a video enabled telemedicine application and verified that I am speaking with the correct person using two identifiers.   I discussed the limitations of evaluation and management by telemedicine and the availability of in person appointments. The patient expressed understanding and agreed to proceed. Crissie SicklesPrincess Carter, CMA was able to get patient set up on video visit platform    Subjective:    Patient ID: Andrea ShadeJulie A Bowman, female    DOB: Feb 04, 1961, 58 y.o.   MRN: [id number]  No chief complaint on file.   HPI Patient is in today for evaluation of respiratory symptoms and chronic medical concerns. She has not been feeling well this week. Has not been feel well with cough, myalgias, chest congestion and fever. Max fever 100.5 last night. Does feel some better today than yesterday but is concerned about Covid and notes her cough is dry and significant. She has not smoked in a couple of days due to cough. Denies CP/palp/HA/GI or GU c/o. Taking meds as prescribed  Past Medical History:  Diagnosis Date  . Acute bronchitis 08/24/2010  . Anxiety    panic attacks  . 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 Complication of anesthesia    hard to wake up post-op  . Dental bridge present    upper  . Dental infection    will finish antibiotic 05/01/2012  . Depression   . Insomnia 11/14/2014  . Osteoarthritis of knee    right  . Perimenopausal   . PVC (premature ventricular contraction)    takes Atenolol   . TOBACCO ABUSE 11/30/2009   Qualifier: Diagnosis of  By: Jens Somrenshaw, MD, Lyn HollingsheadFACC, Brian Saunders   . 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 CHOLECYSTECTOMY  2007  . KNEE ARTHROSCOPY WITH LATERAL RELEASE  05/02/2012   Procedure: KNEE ARTHROSCOPY WITH LATERAL RELEASE;  Surgeon: Harvie JuniorJohn L Graves, MD;  Location: [facility];  Service: Orthopedics;  Laterality: Right;  chondroplasty with lateral release    Family History  Problem Relation Age of Onset  . Cancer Mother [id number]       Breast  . Hypertension Father   . Heart failure Father        CHF  . Depression Sister        previous history of  . Anxiety disorder Sister        Previous history of  . Cancer Sister        cancer, breast  . Depression Brother   . Anxiety disorder Brother   . Ulcers Brother        PUD  . Cancer Brother        skin  . Cancer Maternal Aunt        X several w/ breast cancer, various ages  . Cancer Paternal Uncle        X several w/ prostate cancer, 1 w/colon cancer  . Cancer Maternal Aunt        uterine  . Cancer Maternal Aunt        breast    Social History   Socioeconomic History  . Marital status: Married    Spouse name: Not on file  . Number of children: 1  . Years of education: Not on file  . Highest education level: Not on file  Occupational History  Comment: home  Social Needs  . Financial resource strain: Not on file  . Food insecurity:    Worry: Not on file    Inability: Not on file  . Transportation needs:    Medical: Not on file    Non-medical: Not on file  Tobacco Use  . Smoking status: Current Every Day Smoker    Years: 30.00    Types: Cigarettes  . Smokeless tobacco: Never Used  . Tobacco comment: 3 cig./day and several e-cigarettes/day  Substance and Sexual Activity  . Alcohol use: Yes    Comment: seldom  . Drug use: No  . Sexual activity: Yes    Partners: Male    Comment: is separted from husband, he is out of the country and seeing someone else  Lifestyle  . Physical activity:    Days per week: Not on file    Minutes per session: Not on file  . Stress: Not on file  Relationships  . Social connections:    Talks on phone: Not on file    Gets together: Not on file    Attends religious service: Not on file    Active member of club or organization: Not on file     Attends meetings of clubs or organizations: Not on file    Relationship status: Not on file  . Intimate partner violence:    Fear of current or ex partner: Not on file    Emotionally abused: Not on file    Physically abused: Not on file    Forced sexual activity: Not on file  Other Topics Concern  . Not on file  Social History Narrative   Relocated from [place]. Lives with husband. Home maker.    Outpatient Medications Prior to Visit  Medication Sig Dispense Refill  . acyclovir cream (ZOVIRAX) 5 % Apply 1 application topically every 3 (three) hours. 5 g 2  . albuterol (PROVENTIL HFA;VENTOLIN HFA) 108 (90 Base) MCG/ACT inhaler Inhale 2 puffs into the lungs every 6 (six) hours as needed for wheezing or shortness of breath. 18 g 5  . atenolol (TENORMIN) 25 MG tablet Take 1 tablet (25 mg total) by mouth daily. 90 tablet 1  . cholecalciferol (VITAMIN D) 1000 units tablet Take 1,000 Units by mouth 2 (two) times daily.    . clonazePAM (KLONOPIN) 0.5 MG tablet TAKE 1 TABLET BY MOUTH THREE TIMES A DAY AS NEEDED FOR ANXIETY 90 tablet 0  . escitalopram (LEXAPRO) 20 MG tablet TAKE 2 TABLETS (40 MG TOTAL) BY MOUTH DAILY. (PA) 180 tablet 1  . Esomeprazole Magnesium (NEXIUM PO) Take by mouth.    Marland Kitchen ibuprofen (ADVIL,MOTRIN) 200 MG tablet Take 600 mg by mouth every 6 (six) hours as needed for pain.    Marland Kitchen ipratropium (ATROVENT HFA) 17 MCG/ACT inhaler Inhale 2 puffs into the lungs every 6 (six) hours as needed for wheezing. 12.9 g 5  . nitrofurantoin, macrocrystal-monohydrate, (MACROBID) 100 MG capsule Take 1 capsule (100 mg total) by mouth 2 (two) times daily. 14 capsule 0  . phenazopyridine (PYRIDIUM) 200 MG tablet Take 1 tablet (200 mg total) by mouth 3 (three) times daily as needed for pain. 6 tablet 0  . tiZANidine (ZANAFLEX) 4 MG tablet Take 0.5-1 tablets (2-4 mg total) by mouth every 8 (eight) hours as needed for muscle spasms. 90 tablet 1  . vitamin B-12 (CYANOCOBALAMIN) 1000 MCG tablet Take 1,000  mcg by mouth daily.     No facility-administered medications prior to visit.  Allergies  Allergen Reactions  . Cefdinir Hives  . Doxycycline Diarrhea  . Prednisone Other (See Comments)    REACTION: REDNESS OF FACE    Review of Systems  Constitutional: Positive for fever and malaise/fatigue.  HENT: Positive for congestion.   Eyes: Negative for blurred vision.  Respiratory: Positive for cough and sputum production. Negative for shortness of breath.   Cardiovascular: Negative for chest pain, palpitations and leg swelling.  Gastrointestinal: Negative for abdominal pain, blood in stool and nausea.  Genitourinary: Negative for dysuria and frequency.  Musculoskeletal: Positive for myalgias. Negative for falls.  Skin: Negative for rash.  Neurological: Negative for dizziness, loss of consciousness and headaches.  Endo/Heme/Allergies: Negative for environmental allergies.  Psychiatric/Behavioral: Negative for depression. The patient is not nervous/anxious.        Objective:    Physical Exam Constitutional:      Appearance: Normal appearance. She is not ill-appearing.  HENT:     Head: Normocephalic and atraumatic.     Nose: Nose normal.  Pulmonary:     Effort: Pulmonary effort is normal.  Neurological:     Mental Status: She is alert and oriented to person, place, and time.  Psychiatric:        Mood and Affect: Mood normal.        Behavior: Behavior normal.     Wt 190 lb (86.2 kg)   BMI 29.76 kg/m  Wt Readings from Last 3 Encounters:  07/31/18 190 lb (86.2 kg)  06/12/18 193 lb 3.2 oz (87.6 kg)  04/22/17 181 lb 9.6 oz (82.4 kg)    Diabetic Foot Exam - Simple   No data filed     Lab Results  Component Value Date   WBC 7.6 04/22/2017   HGB 14.6 04/22/2017   HCT 43.7 04/22/2017   PLT 234.0 04/22/2017   GLUCOSE 84 04/22/2017   CHOL 185 04/22/2017   TRIG 94.0 04/22/2017   HDL 61.60 04/22/2017   LDLDIRECT 144.2 01/30/2012   LDLCALC 105 (H) 04/22/2017   ALT 16  04/22/2017   AST 16 04/22/2017   NA 140 04/22/2017   K 4.7 04/22/2017   CL 105 04/22/2017   CREATININE 0.75 04/22/2017   BUN 11 04/22/2017   CO2 30 04/22/2017   TSH 2.02 04/22/2017   INR 1.0 06/16/2012   HGBA1C 5.8 04/22/2017    Lab Results  Component Value Date   TSH 2.02 04/22/2017   Lab Results  Component Value Date   WBC 7.6 04/22/2017   HGB 14.6 04/22/2017   HCT 43.7 04/22/2017   MCV 99.0 04/22/2017   PLT 234.0 04/22/2017   Lab Results  Component Value Date   NA 140 04/22/2017   K 4.7 04/22/2017   CO2 30 04/22/2017   GLUCOSE 84 04/22/2017   BUN 11 04/22/2017   CREATININE 0.75 04/22/2017   BILITOT 0.4 04/22/2017   ALKPHOS 50 04/22/2017   AST 16 04/22/2017   ALT 16 04/22/2017   PROT 6.9 04/22/2017   ALBUMIN 4.1 04/22/2017   CALCIUM 9.5 04/22/2017   GFR 84.18 04/22/2017   Lab Results  Component Value Date   CHOL 185 04/22/2017   Lab Results  Component Value Date   HDL 61.60 04/22/2017   Lab Results  Component Value Date   LDLCALC 105 (H) 04/22/2017   Lab Results  Component Value Date   TRIG 94.0 04/22/2017   Lab Results  Component Value Date   CHOLHDL 3 04/22/2017   Lab Results  Component Value Date  HGBA1C 5.8 04/22/2017       Assessment & Plan:   Problem List Items Addressed This Visit    TOBACCO ABUSE    Encouraged complete cessation especially due to dangers posed by Covid.       Vitamin D deficiency    Supplement and monitor      Panic anxiety syndrome    Is managing fairly well given the current quarantine state.      Respiratory illness with fever    Has not been feel well with cough, myalgias, chest congestion and fever. Max fever 100.5 last night. Does feel some better today than yesterday but is concerned about Covid and notes her cough is dry and significant.        Other Visit Diagnoses    Suspected Covid-19 Virus Infection    -  Primary   Relevant Orders   MYCHART COVID-19 HOME MONITORING PROGRAM   Temperature  monitoring      I am having Andrea Bowman start on albuterol and HYDROcodone-homatropine. I am also having her maintain her ibuprofen, Esomeprazole Magnesium (NEXIUM PO), cholecalciferol, vitamin B-12, albuterol, ipratropium, atenolol, clonazePAM, acyclovir cream, tiZANidine, escitalopram, nitrofurantoin (macrocrystal-monohydrate), and phenazopyridine.  Meds ordered this encounter  Medications  . albuterol (VENTOLIN HFA) 108 (90 Base) MCG/ACT inhaler    Sig: Inhale 2 puffs into the lungs every 6 (six) hours as needed for wheezing or shortness of breath.    Dispense:  1 Inhaler    Refill:  0  . HYDROcodone-homatropine (HYDROMET) 5-1.5 MG/5ML syrup    Sig: Take 5 mLs by mouth every 6 (six) hours as needed for cough.    Dispense:  150 mL    Refill:  0     I discussed the assessment and treatment plan with the patient. The patient was provided an opportunity to ask questions and all were answered. The patient agreed with the plan and demonstrated an understanding of the instructions.   The patient was advised to call back or seek an in-person evaluation if the symptoms worsen or if the condition fails to improve as anticipated.  I provided 25 minutes of non-face-to-face time during this encounter.   Danise Edge, MD

## 2018-08-03 NOTE — Assessment & Plan Note (Signed)
Is managing fairly well given the current quarantine state.

## 2018-08-03 NOTE — Assessment & Plan Note (Signed)
Encouraged complete cessation especially due to dangers posed by Covid.

## 2018-08-03 NOTE — Assessment & Plan Note (Addendum)
Has not been feel well with cough, myalgias, chest congestion and fever. Max fever 100.5 last night. Does feel some better today than yesterday but is concerned about Covid and notes her cough is dry and significant. Launched Covid surveillance my chart program. She is encouraged to hydrate, rest but deep breath, vitamin C, zinc and call with any concerns

## 2018-08-03 NOTE — Assessment & Plan Note (Signed)
Supplement and monitor 

## 2018-08-04 ENCOUNTER — Other Ambulatory Visit: Payer: Self-pay | Admitting: Family Medicine

## 2018-08-04 ENCOUNTER — Telehealth: Payer: Self-pay | Admitting: Family Medicine

## 2018-08-04 ENCOUNTER — Encounter: Payer: Self-pay | Admitting: Family Medicine

## 2018-08-04 MED ORDER — HYDROCODONE-HOMATROPINE 5-1.5 MG/5ML PO SYRP: 5.0000 mL | ORAL_SOLUTION | Freq: Four times a day (QID) | ORAL | 0 refills | Status: DC | PRN

## 2018-08-04 MED ORDER — METHYLPREDNISOLONE 4 MG PO TABS: ORAL_TABLET | ORAL | 0 refills | Status: DC

## 2018-08-04 MED ORDER — SULFAMETHOXAZOLE-TRIMETHOPRIM 800-160 MG PO TABS: 1.0000 | ORAL_TABLET | Freq: Two times a day (BID) | ORAL | 0 refills | Status: DC

## 2018-08-04 NOTE — Telephone Encounter (Signed)
Copied from CRM [id number]. Topic: Quick Communication - Rx Refill/Question >> Aug 04, 2018 12:42 PM Rica Koyanagi, Barbee Cough wrote: Medication: HYDROcodone-homatropine (HYDROMET) 5-1.5 MG/5ML syrup  Has the patient contacted their pharmacy? Yes.   (Agent: If no, request that the patient contact the pharmacy for the refill.) (Agent: If yes, when and what did the pharmacy advise?)  Preferred Pharmacy (with phone number or street name): [facility] [address]  Pt said this was sent to wrong pharmacy They are not able to transfer  Please send to [facility] Thanks   Agent: Please be advised that RX refills may take up to 3 business days. We ask that you follow-up with your pharmacy.

## 2018-08-04 NOTE — Telephone Encounter (Signed)
Can you resend medication to [facility] in [place]

## 2018-08-08 ENCOUNTER — Ambulatory Visit (INDEPENDENT_AMBULATORY_CARE_PROVIDER_SITE_OTHER): Payer: PRIVATE HEALTH INSURANCE | Admitting: Family Medicine

## 2018-08-08 ENCOUNTER — Other Ambulatory Visit: Payer: Self-pay

## 2018-08-08 DIAGNOSIS — Z20828 Contact with and (suspected) exposure to other viral communicable diseases: Secondary | ICD-10-CM | POA: Diagnosis not present

## 2018-08-08 DIAGNOSIS — F17200 Nicotine dependence, unspecified, uncomplicated: Secondary | ICD-10-CM

## 2018-08-08 DIAGNOSIS — R6889 Other general symptoms and signs: Secondary | ICD-10-CM

## 2018-08-08 DIAGNOSIS — R739 Hyperglycemia, unspecified: Secondary | ICD-10-CM

## 2018-08-08 DIAGNOSIS — R079 Chest pain, unspecified: Secondary | ICD-10-CM

## 2018-08-08 DIAGNOSIS — E785 Hyperlipidemia, unspecified: Secondary | ICD-10-CM

## 2018-08-08 DIAGNOSIS — J449 Chronic obstructive pulmonary disease, unspecified: Secondary | ICD-10-CM

## 2018-08-08 DIAGNOSIS — Z20822 Contact with and (suspected) exposure to covid-19: Secondary | ICD-10-CM | POA: Insufficient documentation

## 2018-08-08 MED ORDER — SULFAMETHOXAZOLE-TRIMETHOPRIM 800-160 MG PO TABS: 1.0000 | ORAL_TABLET | Freq: Two times a day (BID) | ORAL | 0 refills | Status: DC

## 2018-08-08 MED ORDER — METHYLPREDNISOLONE 4 MG PO TABS: ORAL_TABLET | ORAL | 0 refills | Status: DC

## 2018-08-08 MED ORDER — BECLOMETHASONE DIPROP HFA 80 MCG/ACT IN AERB: 1.0000 | INHALATION_SPRAY | Freq: Two times a day (BID) | RESPIRATORY_TRACT | 1 refills | Status: DC

## 2018-08-10 NOTE — Progress Notes (Signed)
Virtual Visit via Video Note  I connected with Andrea Bowman on 08/10/18 at 10:20 AM EDT by a video enabled telemedicine application and verified that I am speaking with the correct person using two identifiers.  Location: Patient: home Provider: home   I discussed the limitations of evaluation and management by telemedicine and the availability of in person appointments. The patient expressed understanding and agreed to proceed. Crissie SicklesPrincess Carter, CMA was able to get patient set up on virtual video visit    Subjective:    Patient ID: Andrea Bowman, female    DOB: [birth year], 58 y.o.   MRN: [id number]  No chief complaint on file.   HPI Patient is in today for follow up on respiratory illness suspicious of COVID. She does note her respiratory symptoms have improved some since her last visit but she is noting occasional sense of sharp chest pain, no diaphoresis or sob. Denies palp/SOB/HA/congestion/fevers/GI or GU c/o. Taking meds as prescribed. Her chest pain is fleeing.   Past Medical History:  Diagnosis Date  . Acute bronchitis 08/24/2010  . Anxiety    panic attacks  . 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 Complication of anesthesia    hard to wake up post-op  . Dental bridge present    upper  . Dental infection    will finish antibiotic 05/01/2012  . Depression   . Insomnia 11/14/2014  . Osteoarthritis of knee    right  . Perimenopausal   . PVC (premature ventricular contraction)    takes Atenolol   . TOBACCO ABUSE 11/30/2009   Qualifier: Diagnosis of  By: Jens Somrenshaw, MD, Lyn HollingsheadFACC, Brian Saunders   . 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 CHOLECYSTECTOMY  2007  . KNEE ARTHROSCOPY WITH LATERAL RELEASE  05/02/2012   Procedure: KNEE ARTHROSCOPY WITH LATERAL RELEASE;  Surgeon: Harvie JuniorJohn L Graves, MD;  Location: [facility];  Service: Orthopedics;  Laterality: Right;   chondroplasty with lateral release    Family History  Problem Relation Age of Onset  . Cancer Mother [id number]       Breast  . Hypertension Father   . Heart failure Father        CHF  . Depression Sister        previous history of  . Anxiety disorder Sister        Previous history of  . Cancer Sister        cancer, breast  . Depression Brother   . Anxiety disorder Brother   . Ulcers Brother        PUD  . Cancer Brother        skin  . Cancer Maternal Aunt        X several w/ breast cancer, various ages  . Cancer Paternal Uncle        X several w/ prostate cancer, 1 w/colon cancer  . Cancer Maternal Aunt        uterine  . Cancer Maternal Aunt        breast    Social History   Socioeconomic History  . Marital status: Married    Spouse name: Not on file  . Number of children: 1  . Years of education: Not on file  . Highest education level: Not on file  Occupational History    Comment: home  Social Needs  . Financial resource strain: Not on file  . Food insecurity:  Worry: Not on file    Inability: Not on file  . Transportation needs:    Medical: Not on file    Non-medical: Not on file  Tobacco Use  . Smoking status: Current Every Day Smoker    Years: 30.00    Types: Cigarettes  . Smokeless tobacco: Never Used  . Tobacco comment: 3 cig./day and several e-cigarettes/day  Substance and Sexual Activity  . Alcohol use: Yes    Comment: seldom  . Drug use: No  . Sexual activity: Yes    Partners: Male    Comment: is separted from husband, he is out of the country and seeing someone else  Lifestyle  . Physical activity:    Days per week: Not on file    Minutes per session: Not on file  . Stress: Not on file  Relationships  . Social connections:    Talks on phone: Not on file    Gets together: Not on file    Attends religious service: Not on file    Active member of club or organization: Not on file    Attends meetings of clubs or organizations: Not on file     Relationship status: Not on file  . Intimate partner violence:    Fear of current or ex partner: Not on file    Emotionally abused: Not on file    Physically abused: Not on file    Forced sexual activity: Not on file  Other Topics Concern  . Not on file  Social History Narrative   Relocated from [place]. Lives with husband. Home maker.    Outpatient Medications Prior to Visit  Medication Sig Dispense Refill  . acyclovir cream (ZOVIRAX) 5 % Apply 1 application topically every 3 (three) hours. 5 g 2  . albuterol (PROVENTIL HFA;VENTOLIN HFA) 108 (90 Base) MCG/ACT inhaler Inhale 2 puffs into the lungs every 6 (six) hours as needed for wheezing or shortness of breath. 18 g 5  . albuterol (VENTOLIN HFA) 108 (90 Base) MCG/ACT inhaler Inhale 2 puffs into the lungs every 6 (six) hours as needed for wheezing or shortness of breath. 1 Inhaler 0  . atenolol (TENORMIN) 25 MG tablet Take 1 tablet (25 mg total) by mouth daily. 90 tablet 1  . cholecalciferol (VITAMIN D) 1000 units tablet Take 1,000 Units by mouth 2 (two) times daily.    . clonazePAM (KLONOPIN) 0.5 MG tablet TAKE 1 TABLET BY MOUTH THREE TIMES A DAY AS NEEDED FOR ANXIETY 90 tablet 0  . escitalopram (LEXAPRO) 20 MG tablet TAKE 2 TABLETS (40 MG TOTAL) BY MOUTH DAILY. (PA) 180 tablet 1  . Esomeprazole Magnesium (NEXIUM PO) Take by mouth.    Marland Kitchen HYDROcodone-homatropine (HYDROMET) 5-1.5 MG/5ML syrup Take 5 mLs by mouth every 6 (six) hours as needed for cough. 150 mL 0  . ibuprofen (ADVIL,MOTRIN) 200 MG tablet Take 600 mg by mouth every 6 (six) hours as needed for pain.    Marland Kitchen ipratropium (ATROVENT HFA) 17 MCG/ACT inhaler Inhale 2 puffs into the lungs every 6 (six) hours as needed for wheezing. 12.9 g 5  . phenazopyridine (PYRIDIUM) 200 MG tablet Take 1 tablet (200 mg total) by mouth 3 (three) times daily as needed for pain. 6 tablet 0  . tiZANidine (ZANAFLEX) 4 MG tablet Take 0.5-1 tablets (2-4 mg total) by mouth every 8 (eight) hours as needed for  muscle spasms. 90 tablet 1  . vitamin B-12 (CYANOCOBALAMIN) 1000 MCG tablet Take 1,000 mcg by mouth daily.    Marland Kitchen  methylPREDNISolone (MEDROL) 4 MG tablet 5 tab po qd X 1d then 4 tab po qd X 1d then 3 tab po qd X 1d then 2 tab po qd then 1 tab po qd 15 tablet 0  . sulfamethoxazole-trimethoprim (BACTRIM DS) 800-160 MG tablet Take 1 tablet by mouth 2 (two) times daily. 14 tablet 0   No facility-administered medications prior to visit.     Allergies  Allergen Reactions  . Cefdinir Hives  . Doxycycline Diarrhea  . Prednisone Other (See Comments)    REACTION: REDNESS OF FACE    Review of Systems  Constitutional: Positive for malaise/fatigue. Negative for fever.  HENT: Positive for congestion.   Eyes: Negative for blurred vision.  Respiratory: Positive for cough and shortness of breath.   Cardiovascular: Positive for chest pain. Negative for palpitations and leg swelling.  Gastrointestinal: Negative for abdominal pain, blood in stool and nausea.  Genitourinary: Negative for dysuria and frequency.  Musculoskeletal: Negative for falls.  Skin: Negative for rash.  Neurological: Negative for dizziness, loss of consciousness and headaches.  Endo/Heme/Allergies: Negative for environmental allergies.  Psychiatric/Behavioral: Negative for depression. The patient is not nervous/anxious.        Objective:    Physical Exam Constitutional:      Appearance: Normal appearance. She is not ill-appearing.  HENT:     Head: Normocephalic and atraumatic.     Nose: Nose normal.  Neurological:     Mental Status: She is alert and oriented to person, place, and time.  Psychiatric:        Mood and Affect: Mood normal.        Behavior: Behavior normal.     There were no vitals taken for this visit. Wt Readings from Last 3 Encounters:  07/31/18 190 lb (86.2 kg)  06/12/18 193 lb 3.2 oz (87.6 kg)  04/22/17 181 lb 9.6 oz (82.4 kg)    Diabetic Foot Exam - Simple   No data filed     Lab Results   Component Value Date   WBC 7.6 04/22/2017   HGB 14.6 04/22/2017   HCT 43.7 04/22/2017   PLT 234.0 04/22/2017   GLUCOSE 84 04/22/2017   CHOL 185 04/22/2017   TRIG 94.0 04/22/2017   HDL 61.60 04/22/2017   LDLDIRECT 144.2 01/30/2012   LDLCALC 105 (H) 04/22/2017   ALT 16 04/22/2017   AST 16 04/22/2017   NA 140 04/22/2017   K 4.7 04/22/2017   CL 105 04/22/2017   CREATININE 0.75 04/22/2017   BUN 11 04/22/2017   CO2 30 04/22/2017   TSH 2.02 04/22/2017   INR 1.0 06/16/2012   HGBA1C 5.8 04/22/2017    Lab Results  Component Value Date   TSH 2.02 04/22/2017   Lab Results  Component Value Date   WBC 7.6 04/22/2017   HGB 14.6 04/22/2017   HCT 43.7 04/22/2017   MCV 99.0 04/22/2017   PLT 234.0 04/22/2017   Lab Results  Component Value Date   NA 140 04/22/2017   K 4.7 04/22/2017   CO2 30 04/22/2017   GLUCOSE 84 04/22/2017   BUN 11 04/22/2017   CREATININE 0.75 04/22/2017   BILITOT 0.4 04/22/2017   ALKPHOS 50 04/22/2017   AST 16 04/22/2017   ALT 16 04/22/2017   PROT 6.9 04/22/2017   ALBUMIN 4.1 04/22/2017   CALCIUM 9.5 04/22/2017   GFR 84.18 04/22/2017   Lab Results  Component Value Date   CHOL 185 04/22/2017   Lab Results  Component Value Date  HDL 61.60 04/22/2017   Lab Results  Component Value Date   LDLCALC 105 (H) 04/22/2017   Lab Results  Component Value Date   TRIG 94.0 04/22/2017   Lab Results  Component Value Date   CHOLHDL 3 04/22/2017   Lab Results  Component Value Date   HGBA1C 5.8 04/22/2017       Assessment & Plan:   Problem List Items Addressed This Visit    TOBACCO ABUSE    Has done a good job of abstaining from cigarettes since this started.       Relevant Orders   Ambulatory referral to Cardiology   Hyperlipidemia    Encouraged heart healthy diet, increase exercise, avoid trans fats      Relevant Orders   Ambulatory referral to Cardiology   Hyperglycemia    hgba1c acceptable, minimize simple carbs. Increase exercise as  tolerated.       Relevant Orders   Ambulatory referral to Cardiology   COPD (chronic obstructive pulmonary disease) (HCC)   Relevant Medications   methylPREDNISolone (MEDROL) 4 MG tablet   beclomethasone (QVAR REDIHALER) 80 MCG/ACT inhaler   Other Relevant Orders   Ambulatory referral to Cardiology   Suspected Covid-19 Virus Infection    Encouraged increased rest and hydration, add probiotics, zinc such as Coldeze or Xicam. Treat fevers as needed. Vitamin C, Zinc, sodium bicarb, deep breathing, stay as active as tolerated. Her respiratory symptoms are greatly improved since the last visit with the addition of some inhalers but she is now experiencing some infrequent atypical chest pains and irregular heart beats so have referred her to cardiology for a virtual consultation.        Other Visit Diagnoses    Chest pain, unspecified type    -  Primary   Relevant Orders   Ambulatory referral to Cardiology      I am having Raynelle Fanning A. Gange start on beclomethasone. I am also having her maintain her ibuprofen, Esomeprazole Magnesium (NEXIUM PO), cholecalciferol, vitamin B-12, albuterol, ipratropium, atenolol, clonazePAM, acyclovir cream, tiZANidine, escitalopram, phenazopyridine, albuterol, HYDROcodone-homatropine, methylPREDNISolone, and sulfamethoxazole-trimethoprim.  Meds ordered this encounter  Medications  . methylPREDNISolone (MEDROL) 4 MG tablet    Sig: 5 tab po qd X 1d then 4 tab po qd X 1d then 3 tab po qd X 1d then 2 tab po qd then 1 tab po qd    Dispense:  15 tablet    Refill:  0  . sulfamethoxazole-trimethoprim (BACTRIM DS) 800-160 MG tablet    Sig: Take 1 tablet by mouth 2 (two) times daily.    Dispense:  14 tablet    Refill:  0  . beclomethasone (QVAR REDIHALER) 80 MCG/ACT inhaler    Sig: Inhale 1 puff into the lungs 2 (two) times daily.    Dispense:  1 Inhaler    Refill:  1     I discussed the assessment and treatment plan with the patient. The patient was provided an  opportunity to ask questions and all were answered. The patient agreed with the plan and demonstrated an understanding of the instructions.   The patient was advised to call back or seek an in-person evaluation if the symptoms worsen or if the condition fails to improve as anticipated.  I provided 25 minutes of non-face-to-face time during this encounter.   Danise Edge, MD

## 2018-08-10 NOTE — Assessment & Plan Note (Signed)
Encouraged heart healthy diet, increase exercise, avoid trans fats 

## 2018-08-10 NOTE — Assessment & Plan Note (Signed)
hgba1c acceptable, minimize simple carbs. Increase exercise as tolerated.  

## 2018-08-10 NOTE — Assessment & Plan Note (Signed)
Has done a good job of abstaining from cigarettes since this started.

## 2018-08-10 NOTE — Assessment & Plan Note (Addendum)
Encouraged increased rest and hydration, add probiotics, zinc such as Coldeze or Xicam. Treat fevers as needed. Vitamin C, Zinc, sodium bicarb, deep breathing, stay as active as tolerated. Her respiratory symptoms are greatly improved since the last visit with the addition of some inhalers but she is now experiencing some infrequent atypical chest pains and irregular heart beats so have referred her to cardiology for a virtual consultation.

## 2018-08-11 ENCOUNTER — Telehealth: Payer: Self-pay | Admitting: Cardiology

## 2018-08-11 NOTE — Telephone Encounter (Signed)
Consent thru my chart.

## 2018-08-12 ENCOUNTER — Encounter (INDEPENDENT_AMBULATORY_CARE_PROVIDER_SITE_OTHER): Payer: Self-pay

## 2018-08-19 NOTE — Progress Notes (Deleted)
Virtual Visit via Video Note   This visit type was conducted due to national recommendations for restrictions regarding the COVID-19 Pandemic (e.g. social distancing) in an effort to limit this patient's exposure and mitigate transmission in our community.  Due to her co-morbid illnesses, this patient is at least at moderate risk for complications without adequate follow up.  This format is felt to be most appropriate for this patient at this time.  All issues noted in this document were discussed and addressed.  A limited physical exam was performed with this format.  Please refer to the patient's chart for her consent to telehealth for [facility].   Date:  08/19/2018   ID:  Andrea Bowman, DOB [birth year], MRN [id number]  Patient Location: Home Provider Location: Home  PCP:  Bradd Canary, MD  Cardiologist:  New Dr Jens Som  Evaluation Performed:  Consultation - Andrea Bowman was referred by Danise Edge, MD for the evaluation of chest pain.  Chief Complaint: Chest pain  History of Present Illness:    Andrea Bowman is a 58 y.o. female with with past medical history of PVCs, tobacco abuse for evaluation of chest pain.  Seen previously in this office but not since 2014.  Had normal stress echocardiogram October 2011.  Cardiac catheterization in 2014 showed no obstructive coronary disease.  Chest x-ray March 2020 showed COPD and no active disease.  The patient does not have symptoms concerning for COVID-19 infection (fever, chills, cough, or new shortness of breath).    Past Medical History:  Diagnosis Date  . Acute bronchitis 08/24/2010  . Anxiety    panic attacks  . 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 Complication of anesthesia    hard to wake up post-op  . Dental bridge present    upper  . Dental infection    will finish antibiotic 05/01/2012  . Depression   . Insomnia 11/14/2014  . Osteoarthritis of knee    right  . Perimenopausal   . PVC (premature  ventricular contraction)    takes Atenolol   . TOBACCO ABUSE 11/30/2009   Qualifier: Diagnosis of  By: Jens Som, MD, Lyn Hollingshead   . Traumatic degenerative arthritis of carpometacarpal joint of thumb 08/24/2010   Past Surgical History:  Procedure Laterality Date  . CHOLECYSTECTOMY  2007  . KNEE ARTHROSCOPY WITH LATERAL RELEASE  05/02/2012   Procedure: KNEE ARTHROSCOPY WITH LATERAL RELEASE;  Surgeon: Harvie Junior, MD;  Location: [facility];  Service: Orthopedics;  Laterality: Right;  chondroplasty with lateral release     No outpatient medications have been marked as taking for the 08/20/18 encounter (Appointment) with Lewayne Bunting, MD.     Allergies:   Cefdinir; Doxycycline; and Prednisone   Social History   Tobacco Use  . Smoking status: Current Every Day Smoker    Years: 30.00    Types: Cigarettes  . Smokeless tobacco: Never Used  . Tobacco comment: 3 cig./day and several e-cigarettes/day  Substance Use Topics  . Alcohol use: Yes    Comment: seldom  . Drug use: No     Family Hx: The patient's family history includes Anxiety disorder in her brother and sister; Cancer in her brother, maternal aunt, maternal aunt, maternal aunt, paternal uncle, and sister; Cancer (age of onset: 8) in her mother; Depression in her brother and sister; Heart failure in her father; Hypertension in her father; Ulcers in her brother.  ROS:   Please see the history of  present illness.    No fevers, chills or productive cough. All other systems reviewed and are negative.   Recent Lipid Panel Lab Results  Component Value Date/Time   CHOL 185 04/22/2017 12:18 PM   TRIG 94.0 04/22/2017 12:18 PM   HDL 61.60 04/22/2017 12:18 PM   CHOLHDL 3 04/22/2017 12:18 PM   LDLCALC 105 (H) 04/22/2017 12:18 PM   LDLDIRECT 144.2 01/30/2012 08:05 AM    Wt Readings from Last 3 Encounters:  07/31/18 190 lb (86.2 kg)  06/12/18 193 lb 3.2 oz (87.6 kg)  04/22/17 181 lb 9.6 oz (82.4 kg)      Objective:    Vital Signs:  There were no vitals taken for this visit.   VITAL SIGNS:  reviewed  No acute distress Answers questions appropriately Normal affect Remainder of physical examination not performed (telehealth visit; coronavirus pandemic)  ASSESSMENT & PLAN:    1. Chest pain- 2. PVCs- 3. Tobacco abuse-patient counseled on discontinuing.  COVID-19 Education: The importance of social distancing was discussed today.  Time:   Today, I have spent *** minutes with the patient with telehealth technology discussing the above problems.     Medication Adjustments/Labs and Tests Ordered: Current medicines are reviewed at length with the patient today.  Concerns regarding medicines are outlined above.   Tests Ordered: No orders of the defined types were placed in this encounter.   Medication Changes: No orders of the defined types were placed in this encounter.   Disposition:  Follow up {follow up:15908}  Signed, Olga MillersBrian Crenshaw, MD  08/19/2018 4:51 PM    [facility]

## 2018-08-20 ENCOUNTER — Other Ambulatory Visit: Payer: Self-pay

## 2018-08-20 ENCOUNTER — Telehealth: Payer: PRIVATE HEALTH INSURANCE | Admitting: Cardiology

## 2018-08-29 ENCOUNTER — Encounter: Payer: Self-pay | Admitting: Family Medicine

## 2018-09-03 ENCOUNTER — Encounter: Payer: PRIVATE HEALTH INSURANCE | Admitting: Obstetrics and Gynecology

## 2018-09-28 ENCOUNTER — Encounter: Payer: Self-pay | Admitting: Family Medicine

## 2018-10-02 ENCOUNTER — Ambulatory Visit: Payer: PRIVATE HEALTH INSURANCE | Admitting: Family Medicine

## 2018-10-14 ENCOUNTER — Encounter: Payer: Self-pay | Admitting: Family Medicine

## 2018-10-31 ENCOUNTER — Other Ambulatory Visit: Payer: Self-pay | Admitting: Family Medicine

## 2018-10-31 DIAGNOSIS — F341 Dysthymic disorder: Secondary | ICD-10-CM

## 2018-10-31 MED ORDER — CLONAZEPAM 0.5 MG PO TABS: ORAL_TABLET | ORAL | 0 refills | Status: DC

## 2018-10-31 MED FILL — clonazePAM 0.5 MG TABS: 0.5 | 30 days supply | Qty: 90 | Fill #0

## 2018-11-25 ENCOUNTER — Other Ambulatory Visit: Payer: Self-pay | Admitting: Family Medicine

## 2019-01-26 ENCOUNTER — Other Ambulatory Visit: Payer: Self-pay | Admitting: Family Medicine

## 2019-01-26 DIAGNOSIS — F329 Major depressive disorder, single episode, unspecified: Secondary | ICD-10-CM

## 2019-01-26 DIAGNOSIS — F419 Anxiety disorder, unspecified: Secondary | ICD-10-CM

## 2019-02-08 ENCOUNTER — Other Ambulatory Visit: Payer: Self-pay | Admitting: Family Medicine

## 2019-02-23 ENCOUNTER — Other Ambulatory Visit: Payer: Self-pay | Admitting: Family Medicine

## 2019-02-23 DIAGNOSIS — F341 Dysthymic disorder: Secondary | ICD-10-CM

## 2019-02-23 MED ORDER — CLONAZEPAM 0.5 MG PO TABS: ORAL_TABLET | ORAL | 0 refills | Status: DC

## 2019-02-23 NOTE — Telephone Encounter (Signed)
She has been six months since seen she needs a follow up visit to keep prescription active. Have sent in just #20 to hold her til seen

## 2019-03-18 ENCOUNTER — Other Ambulatory Visit: Payer: Self-pay | Admitting: Family Medicine

## 2019-03-18 DIAGNOSIS — F341 Dysthymic disorder: Secondary | ICD-10-CM

## 2019-03-18 MED ORDER — CLONAZEPAM 0.5 MG PO TABS: ORAL_TABLET | ORAL | 0 refills | Status: DC

## 2019-03-31 ENCOUNTER — Other Ambulatory Visit: Payer: Self-pay | Admitting: Family Medicine

## 2019-04-07 ENCOUNTER — Encounter: Payer: Self-pay | Admitting: Family Medicine

## 2019-04-07 ENCOUNTER — Ambulatory Visit: Payer: PRIVATE HEALTH INSURANCE | Admitting: Family Medicine

## 2019-04-07 ENCOUNTER — Other Ambulatory Visit: Payer: Self-pay | Admitting: Family Medicine

## 2019-04-07 DIAGNOSIS — F341 Dysthymic disorder: Secondary | ICD-10-CM

## 2019-04-07 NOTE — Telephone Encounter (Signed)
She has already been told no more til seen and no appt in on the books. This has to be arranged before I will consider giving her 10 to hold her over

## 2019-04-07 NOTE — Telephone Encounter (Signed)
Requesting:Clonazepam Contract:n/a UDS:yes Last OV:08/08/18 Next OV:04/09/19 Last Refill:03/18/19 (20 tab) Database:   Please advise

## 2019-04-08 MED ORDER — CLONAZEPAM 0.5 MG PO TABS: ORAL_TABLET | ORAL | 0 refills | Status: DC

## 2019-04-09 ENCOUNTER — Encounter: Payer: Self-pay | Admitting: Family Medicine

## 2019-04-09 ENCOUNTER — Ambulatory Visit (INDEPENDENT_AMBULATORY_CARE_PROVIDER_SITE_OTHER): Payer: PRIVATE HEALTH INSURANCE | Admitting: Family Medicine

## 2019-04-09 ENCOUNTER — Other Ambulatory Visit: Payer: Self-pay

## 2019-04-09 VITALS — HR 71 | Wt 191.0 lb

## 2019-04-09 DIAGNOSIS — E785 Hyperlipidemia, unspecified: Secondary | ICD-10-CM

## 2019-04-09 DIAGNOSIS — F341 Dysthymic disorder: Secondary | ICD-10-CM | POA: Diagnosis not present

## 2019-04-09 DIAGNOSIS — R739 Hyperglycemia, unspecified: Secondary | ICD-10-CM

## 2019-04-09 DIAGNOSIS — E559 Vitamin D deficiency, unspecified: Secondary | ICD-10-CM | POA: Diagnosis not present

## 2019-04-09 MED ORDER — CLONAZEPAM 0.5 MG PO TABS: ORAL_TABLET | ORAL | 5 refills | Status: DC

## 2019-04-09 NOTE — Progress Notes (Signed)
Wants a 3 month supply of the Klonopin moving for husbands work, want to find a doctor where she is going

## 2019-04-11 ENCOUNTER — Other Ambulatory Visit: Payer: Self-pay | Admitting: Family Medicine

## 2019-04-12 NOTE — Assessment & Plan Note (Signed)
Supplement and monitor 

## 2019-04-12 NOTE — Progress Notes (Signed)
Patient ID: Andrea Bowman, female   DOB: 1961-03-04, 59 y.o.   MRN: [id number] Virtual Visit via Video Note  I connected with [name] on 04/09/19 at  9:00 AM EST by a video enabled telemedicine application and verified that I am speaking with the correct person using two identifiers.  Location: Patient: home Provider: home   I discussed the limitations of evaluation and management by telemedicine and the availability of in person appointments. The patient expressed understanding and agreed to proceed. Crissie Sickles, CMA was able to get the patient set up on a visit, video   Subjective:    Patient ID: Andrea Bowman, female    DOB: [birth year], 59 y.o.   MRN: [id number]  No chief complaint on file.   HPI Patient is in today for follow up on chronic medical concerns including depression, anxiety, hyperlipidemia, and hyperglycemia. No recent febrile illness or hospitalizations. No polyuria or polydipsia. She is preparing to move to [place] for her husband's job. Denies CP/palp/SOB/HA/congestion/fevers/GI or GU c/o. Taking meds as prescribed  Past Medical History:  Diagnosis Date  . Acute bronchitis 08/24/2010  . Anxiety    panic attacks  . 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 Complication of anesthesia    hard to wake up post-op  . Dental bridge present    upper  . Dental infection    will finish antibiotic 05/01/2012  . Depression   . Insomnia 11/14/2014  . Osteoarthritis of knee    right  . Perimenopausal   . PVC (premature ventricular contraction)    takes Atenolol   . TOBACCO ABUSE 11/30/2009   Qualifier: Diagnosis of  By: Jens Som, MD, Lyn Hollingshead   . 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 CHOLECYSTECTOMY  2007  . KNEE ARTHROSCOPY WITH LATERAL RELEASE  05/02/2012   Procedure: KNEE ARTHROSCOPY WITH LATERAL RELEASE;  Surgeon: Harvie Junior, MD;  Location:  [facility];  Service: Orthopedics;  Laterality: Right;  chondroplasty with lateral release    Family History  Problem Relation Age of Onset  . Cancer Mother 70       Breast  . Hypertension Father   . Heart failure Father        CHF  . Depression Sister        previous history of  . Anxiety disorder Sister        Previous history of  . Cancer Sister        cancer, breast  . Depression Brother   . Anxiety disorder Brother   . Ulcers Brother        PUD  . Cancer Brother        skin  . Cancer Maternal Aunt        X several w/ breast cancer, various ages  . Cancer Paternal Uncle        X several w/ prostate cancer, 1 w/colon cancer  . Cancer Maternal Aunt        uterine  . Cancer Maternal Aunt        breast    Social History   Socioeconomic History  . Marital status: Married    Spouse name: Not on file  . Number of children: 1  . Years of education: Not on file  . Highest education level: Not on file  Occupational History    Comment: home  Tobacco Use  . Smoking  status: Current Every Day Smoker    Years: 30.00    Types: Cigarettes  . Smokeless tobacco: Never Used  . Tobacco comment: 3 cig./day and several e-cigarettes/day  Substance and Sexual Activity  . Alcohol use: Yes    Comment: seldom  . Drug use: No  . Sexual activity: Yes    Partners: Male    Comment: is separted from husband, he is out of the country and seeing someone else  Other Topics Concern  . Not on file  Social History Narrative   Relocated from [place]. Lives with husband. Home maker.   Social Determinants of Health   Financial Resource Strain:   . Difficulty of Paying Living Expenses: Not on file  Food Insecurity:   . Worried About Programme researcher, broadcasting/film/video in the Last Year: Not on file  . Ran Out of Food in the Last Year: Not on file  Transportation Needs:   . Lack of Transportation (Medical): Not on file  . Lack of Transportation (Non-Medical): Not on file  Physical Activity:    . Days of Exercise per Week: Not on file  . Minutes of Exercise per Session: Not on file  Stress:   . Feeling of Stress : Not on file  Social Connections:   . Frequency of Communication with Friends and Family: Not on file  . Frequency of Social Gatherings with Friends and Family: Not on file  . Attends Religious Services: Not on file  . Active Member of Clubs or Organizations: Not on file  . Attends Banker Meetings: Not on file  . Marital Status: Not on file  Intimate Partner Violence:   . Fear of Current or Ex-Partner: Not on file  . Emotionally Abused: Not on file  . Physically Abused: Not on file  . Sexually Abused: Not on file    Outpatient Medications Prior to Visit  Medication Sig Dispense Refill  . acyclovir cream (ZOVIRAX) 5 % Apply 1 application topically every 3 (three) hours. 5 g 2  . albuterol (PROVENTIL HFA;VENTOLIN HFA) 108 (90 Base) MCG/ACT inhaler Inhale 2 puffs into the lungs every 6 (six) hours as needed for wheezing or shortness of breath. 18 g 5  . albuterol (VENTOLIN HFA) 108 (90 Base) MCG/ACT inhaler Inhale 2 puffs into the lungs every 6 (six) hours as needed for wheezing or shortness of breath. 1 Inhaler 0  . atenolol (TENORMIN) 25 MG tablet TAKE 1 TABLET BY MOUTH EVERY DAY 90 tablet 0  . cholecalciferol (VITAMIN D) 1000 units tablet Take 1,000 Units by mouth 2 (two) times daily.    Marland Kitchen escitalopram (LEXAPRO) 20 MG tablet TAKE 2 TABLETS (40 MG TOTAL) BY MOUTH DAILY. 180 tablet 1  . Esomeprazole Magnesium (NEXIUM PO) Take by mouth.    Marland Kitchen HYDROcodone-homatropine (HYDROMET) 5-1.5 MG/5ML syrup Take 5 mLs by mouth every 6 (six) hours as needed for cough. 150 mL 0  . ibuprofen (ADVIL,MOTRIN) 200 MG tablet Take 600 mg by mouth every 6 (six) hours as needed for pain.    Marland Kitchen ipratropium (ATROVENT HFA) 17 MCG/ACT inhaler Inhale 2 puffs into the lungs every 6 (six) hours as needed for wheezing. 12.9 g 5  . methylPREDNISolone (MEDROL) 4 MG tablet 5 tab po qd X 1d  then 4 tab po qd X 1d then 3 tab po qd X 1d then 2 tab po qd then 1 tab po qd 15 tablet 0  . phenazopyridine (PYRIDIUM) 200 MG tablet Take 1 tablet (200 mg total) by  mouth 3 (three) times daily as needed for pain. 6 tablet 0  . sulfamethoxazole-trimethoprim (BACTRIM DS) 800-160 MG tablet Take 1 tablet by mouth 2 (two) times daily. 14 tablet 0  . tiZANidine (ZANAFLEX) 4 MG tablet Take 0.5-1 tablets (2-4 mg total) by mouth every 8 (eight) hours as needed for muscle spasms. 90 tablet 1  . vitamin B-12 (CYANOCOBALAMIN) 1000 MCG tablet Take 1,000 mcg by mouth daily.    . clonazePAM (KLONOPIN) 0.5 MG tablet TAKE 1 TABLET BY MOUTH THREE TIMES A DAY AS NEEDED FOR ANXIETY 20 tablet 0  . QVAR REDIHALER 80 MCG/ACT inhaler TAKE 1 PUFF BY MOUTH TWICE A DAY 10.6 g 1   No facility-administered medications prior to visit.    Allergies  Allergen Reactions  . Cefdinir Hives  . Doxycycline Diarrhea  . Prednisone Other (See Comments)    REACTION: REDNESS OF FACE    Review of Systems  Constitutional: Negative for fever and malaise/fatigue.  HENT: Negative for congestion.   Eyes: Negative for blurred vision.  Respiratory: Negative for shortness of breath.   Cardiovascular: Negative for chest pain, palpitations and leg swelling.  Gastrointestinal: Negative for abdominal pain, blood in stool and nausea.  Genitourinary: Negative for dysuria and frequency.  Musculoskeletal: Negative for falls.  Skin: Negative for rash.  Neurological: Negative for dizziness, loss of consciousness and headaches.  Endo/Heme/Allergies: Negative for environmental allergies.  Psychiatric/Behavioral: Positive for depression. The patient is nervous/anxious.        Objective:    Physical Exam Constitutional:      Appearance: Normal appearance. She is not ill-appearing.  HENT:     Head: Normocephalic and atraumatic.     Nose: Nose normal.  Pulmonary:     Effort: Pulmonary effort is normal.  Neurological:     Mental Status:  She is alert and oriented to person, place, and time.  Psychiatric:        Behavior: Behavior normal.     Pulse 71   Wt 191 lb (86.6 kg)   SpO2 97%   BMI 29.91 kg/m  Wt Readings from Last 3 Encounters:  04/09/19 191 lb (86.6 kg)  07/31/18 190 lb (86.2 kg)  06/12/18 193 lb 3.2 oz (87.6 kg)    Diabetic Foot Exam - Simple   No data filed     Lab Results  Component Value Date   WBC 7.6 04/22/2017   HGB 14.6 04/22/2017   HCT 43.7 04/22/2017   PLT 234.0 04/22/2017   GLUCOSE 84 04/22/2017   CHOL 185 04/22/2017   TRIG 94.0 04/22/2017   HDL 61.60 04/22/2017   LDLDIRECT 144.2 01/30/2012   LDLCALC 105 (H) 04/22/2017   ALT 16 04/22/2017   AST 16 04/22/2017   NA 140 04/22/2017   K 4.7 04/22/2017   CL 105 04/22/2017   CREATININE 0.75 04/22/2017   BUN 11 04/22/2017   CO2 30 04/22/2017   TSH 2.02 04/22/2017   INR 1.0 06/16/2012   HGBA1C 5.8 04/22/2017    Lab Results  Component Value Date   TSH 2.02 04/22/2017   Lab Results  Component Value Date   WBC 7.6 04/22/2017   HGB 14.6 04/22/2017   HCT 43.7 04/22/2017   MCV 99.0 04/22/2017   PLT 234.0 04/22/2017   Lab Results  Component Value Date   NA 140 04/22/2017   K 4.7 04/22/2017   CO2 30 04/22/2017   GLUCOSE 84 04/22/2017   BUN 11 04/22/2017   CREATININE 0.75 04/22/2017   BILITOT 0.4  04/22/2017   ALKPHOS 50 04/22/2017   AST 16 04/22/2017   ALT 16 04/22/2017   PROT 6.9 04/22/2017   ALBUMIN 4.1 04/22/2017   CALCIUM 9.5 04/22/2017   GFR 84.18 04/22/2017   Lab Results  Component Value Date   CHOL 185 04/22/2017   Lab Results  Component Value Date   HDL 61.60 04/22/2017   Lab Results  Component Value Date   LDLCALC 105 (H) 04/22/2017   Lab Results  Component Value Date   TRIG 94.0 04/22/2017   Lab Results  Component Value Date   CHOLHDL 3 04/22/2017   Lab Results  Component Value Date   HGBA1C 5.8 04/22/2017       Assessment & Plan:   Problem List Items Addressed This Visit    ANXIETY  DEPRESSION    She is moving soon for her husband's work and is managing fairly well. She is given a refill on Klonopin no changes to therapy      Relevant Medications   clonazePAM (KLONOPIN) 0.5 MG tablet   Vitamin D deficiency    Supplement and monitor.      Relevant Orders   TSH   Vitamin B12   Hyperlipidemia    Encouraged heart healthy diet, increase exercise, avoid trans fats, consider a krill oil cap daily      Relevant Orders   Lipid panel   TSH   Hyperglycemia - Primary    hgba1c acceptable, minimize simple carbs. Increase exercise as tolerated. Continue current meds      Relevant Orders   A1C   CBC   CMP   TSH      I am having Raynelle Fanning A. Loletha Grayer maintain her ibuprofen, Esomeprazole Magnesium (NEXIUM PO), cholecalciferol, vitamin B-12, albuterol, ipratropium, acyclovir cream, tiZANidine, phenazopyridine, albuterol, HYDROcodone-homatropine, methylPREDNISolone, sulfamethoxazole-trimethoprim, escitalopram, atenolol, and clonazePAM.  Meds ordered this encounter  Medications  . clonazePAM (KLONOPIN) 0.5 MG tablet    Sig: TAKE 1 TABLET BY MOUTH THREE TIMES A DAY AS NEEDED FOR ANXIETY    Dispense:  60 tablet    Refill:  5     I discussed the assessment and treatment plan with the patient. The patient was provided an opportunity to ask questions and all were answered. The patient agreed with the plan and demonstrated an understanding of the instructions.   The patient was advised to call back or seek an in-person evaluation if the symptoms worsen or if the condition fails to improve as anticipated.  I provided 25 minutes of non-face-to-face time during this encounter.   Danise Edge, MD

## 2019-04-12 NOTE — Assessment & Plan Note (Signed)
She is moving soon for her husband's work and is managing fairly well. She is given a refill on Klonopin no changes to therapy

## 2019-04-12 NOTE — Assessment & Plan Note (Signed)
Encouraged heart healthy diet, increase exercise, avoid trans fats, consider a krill oil cap daily 

## 2019-04-12 NOTE — Assessment & Plan Note (Signed)
hgba1c acceptable, minimize simple carbs. Increase exercise as tolerated. Continue current meds 

## 2019-04-17 ENCOUNTER — Other Ambulatory Visit: Payer: PRIVATE HEALTH INSURANCE

## 2019-04-23 ENCOUNTER — Other Ambulatory Visit (INDEPENDENT_AMBULATORY_CARE_PROVIDER_SITE_OTHER): Payer: Managed Care, Other (non HMO)

## 2019-04-23 ENCOUNTER — Other Ambulatory Visit: Payer: Self-pay

## 2019-04-23 DIAGNOSIS — E559 Vitamin D deficiency, unspecified: Secondary | ICD-10-CM

## 2019-04-23 DIAGNOSIS — R739 Hyperglycemia, unspecified: Secondary | ICD-10-CM | POA: Diagnosis not present

## 2019-04-23 DIAGNOSIS — E785 Hyperlipidemia, unspecified: Secondary | ICD-10-CM

## 2019-04-23 LAB — COMPREHENSIVE METABOLIC PANEL
ALT: 17 U/L (ref 0–35)
AST: 17 U/L (ref 0–37)
Albumin: 4.1 g/dL (ref 3.5–5.2)
Alkaline Phosphatase: 55 U/L (ref 39–117)
BUN: 15 mg/dL (ref 6–23)
CO2: 29 mEq/L (ref 19–32)
Calcium: 9.1 mg/dL (ref 8.4–10.5)
Chloride: 105 mEq/L (ref 96–112)
Creatinine, Ser: 0.79 mg/dL (ref 0.40–1.20)
GFR: 74.09 mL/min (ref >60.00)
Glucose, Bld: 104 mg/dL — ABNORMAL HIGH (ref 70–99)
Potassium: 4.5 mEq/L (ref 3.5–5.1)
Sodium: 138 mEq/L (ref 135–145)
Total Bilirubin: 0.4 mg/dL (ref 0.2–1.2)
Total Protein: 6.5 g/dL (ref 6.0–8.3)

## 2019-04-23 LAB — CBC
HCT: 43.9 % (ref 36.0–46.0)
Hemoglobin: 14.5 g/dL (ref 12.0–15.0)
MCHC: 33.1 g/dL (ref 30.0–36.0)
MCV: 97.6 fl (ref 78.0–100.0)
Platelets: 246 10*3/uL (ref 150.0–400.0)
RBC: 4.5 Mil/uL (ref 3.87–5.11)
RDW: 13.2 % (ref 11.5–15.5)
WBC: 6.2 10*3/uL (ref 4.0–10.5)

## 2019-04-23 LAB — LIPID PANEL
Cholesterol: 195 mg/dL (ref 0–200)
HDL: 54.7 mg/dL (ref >39.00)
LDL Cholesterol: 116 mg/dL — ABNORMAL HIGH (ref 0–99)
NonHDL: 140.04
Total CHOL/HDL Ratio: 4
Triglycerides: 120 mg/dL (ref 0.0–149.0)
VLDL: 24 mg/dL (ref 0.0–40.0)

## 2019-04-23 LAB — VITAMIN B12: Vitamin B-12: 1299 pg/mL — ABNORMAL HIGH (ref 211–911)

## 2019-04-23 LAB — HEMOGLOBIN A1C: Hgb A1c MFr Bld: 6 % (ref 4.6–6.5)

## 2019-04-23 LAB — TSH: TSH: 1.5 u[IU]/mL (ref 0.35–4.50)

## 2019-04-23 MED FILL — clonazePAM 0.5 MG TABS: 0.5 | 20 days supply | Qty: 60 | Fill #0

## 2019-05-18 ENCOUNTER — Encounter: Payer: Self-pay | Admitting: Family Medicine

## 2019-05-18 ENCOUNTER — Other Ambulatory Visit: Payer: Self-pay | Admitting: Family Medicine

## 2019-05-18 MED ORDER — ATENOLOL 25 MG PO TABS: 25.0000 mg | ORAL_TABLET | Freq: Every day | ORAL | 1 refills | Status: DC

## 2019-05-18 MED FILL — clonazePAM 0.5 MG TABS: 0.5 | 20 days supply | Qty: 60 | Fill #1

## 2019-05-19 ENCOUNTER — Ambulatory Visit: Payer: PRIVATE HEALTH INSURANCE | Admitting: Family Medicine

## 2019-06-01 ENCOUNTER — Other Ambulatory Visit: Payer: Self-pay | Admitting: Family Medicine

## 2019-06-01 DIAGNOSIS — F341 Dysthymic disorder: Secondary | ICD-10-CM

## 2019-06-01 MED ORDER — CLONAZEPAM 0.5 MG PO TABS: ORAL_TABLET | ORAL | 5 refills | Status: DC

## 2019-07-31 ENCOUNTER — Other Ambulatory Visit: Payer: Self-pay | Admitting: Family Medicine

## 2019-07-31 DIAGNOSIS — F419 Anxiety disorder, unspecified: Secondary | ICD-10-CM

## 2019-07-31 DIAGNOSIS — F329 Major depressive disorder, single episode, unspecified: Secondary | ICD-10-CM

## 2020-01-05 ENCOUNTER — Other Ambulatory Visit: Payer: Self-pay | Admitting: Family Medicine

## 2020-01-05 ENCOUNTER — Encounter: Payer: Self-pay | Admitting: Family Medicine

## 2020-01-05 DIAGNOSIS — F341 Dysthymic disorder: Secondary | ICD-10-CM

## 2020-01-05 DIAGNOSIS — F32A Depression, unspecified: Secondary | ICD-10-CM

## 2020-01-05 MED ORDER — ATENOLOL 25 MG PO TABS: 25.0000 mg | ORAL_TABLET | Freq: Every day | ORAL | 1 refills | Status: DC

## 2020-01-05 MED ORDER — CLONAZEPAM 0.5 MG PO TABS: ORAL_TABLET | ORAL | 0 refills | Status: DC

## 2020-01-05 MED ORDER — ESCITALOPRAM OXALATE 20 MG PO TABS: 40.0000 mg | ORAL_TABLET | Freq: Every day | ORAL | 1 refills | Status: DC

## 2020-01-05 MED FILL — ESCITALOPRAM 20 MG TABLET: 20 | 90 days supply | Qty: 180 | Fill #0

## 2020-01-05 MED FILL — ATENOLOL 25 MG TABLET: 25 | 90 days supply | Qty: 90 | Fill #0

## 2020-01-05 NOTE — Telephone Encounter (Signed)
Dr. Abner Greenspan- will you refill the clonazepam please?

## 2020-01-06 MED FILL — clonazePAM 0.5 MG TABS: 0.5 | 20 days supply | Qty: 60 | Fill #0

## 2020-03-21 ENCOUNTER — Other Ambulatory Visit: Payer: Self-pay | Admitting: Family Medicine

## 2020-03-21 DIAGNOSIS — F341 Dysthymic disorder: Secondary | ICD-10-CM

## 2020-03-22 ENCOUNTER — Other Ambulatory Visit: Payer: Self-pay | Admitting: Family Medicine

## 2020-03-22 DIAGNOSIS — F341 Dysthymic disorder: Secondary | ICD-10-CM

## 2020-03-22 MED ORDER — CLONAZEPAM 0.5 MG PO TABS: ORAL_TABLET | ORAL | 0 refills | Status: DC

## 2020-03-22 MED FILL — clonazePAM 0.5 MG TABS: 0.5 | 20 days supply | Qty: 60 | Fill #0

## 2020-04-05 ENCOUNTER — Other Ambulatory Visit: Payer: Self-pay | Admitting: Family Medicine

## 2020-04-05 MED ORDER — ATENOLOL 25 MG PO TABS: 25.0000 mg | ORAL_TABLET | Freq: Every day | ORAL | 1 refills | Status: DC

## 2020-04-05 MED FILL — ATENOLOL 25 MG TABLET: 25 | 90 days supply | Qty: 90 | Fill #0

## 2020-04-05 NOTE — Addendum Note (Signed)
Addended byConrad [place] D on: 04/05/2020 02:21 PM   Modules accepted: Orders

## 2020-05-12 ENCOUNTER — Other Ambulatory Visit: Payer: Self-pay | Admitting: Family Medicine

## 2020-05-12 DIAGNOSIS — F341 Dysthymic disorder: Secondary | ICD-10-CM

## 2020-05-13 ENCOUNTER — Other Ambulatory Visit: Payer: Self-pay | Admitting: Family Medicine

## 2020-05-13 MED ORDER — CLONAZEPAM 0.5 MG PO TABS: ORAL_TABLET | ORAL | 0 refills | Status: DC

## 2020-05-13 MED FILL — clonazePAM 0.5 MG TABS: 0.5 | 20 days supply | Qty: 60 | Fill #0

## 2020-05-13 NOTE — Telephone Encounter (Signed)
Last OV--04/09/2019 Next Scheduled OV--06/07/2020 Last Refill on 03/22/2020---#60 no refills No CSC/UDS

## 2020-05-18 ENCOUNTER — Encounter: Payer: Self-pay | Admitting: Family Medicine

## 2020-05-19 ENCOUNTER — Other Ambulatory Visit: Payer: Self-pay

## 2020-05-19 ENCOUNTER — Telehealth (INDEPENDENT_AMBULATORY_CARE_PROVIDER_SITE_OTHER): Payer: Managed Care, Other (non HMO) | Admitting: Family Medicine

## 2020-05-19 ENCOUNTER — Other Ambulatory Visit (HOSPITAL_BASED_OUTPATIENT_CLINIC_OR_DEPARTMENT_OTHER): Payer: Self-pay | Admitting: Family Medicine

## 2020-05-19 DIAGNOSIS — R739 Hyperglycemia, unspecified: Secondary | ICD-10-CM | POA: Diagnosis not present

## 2020-05-19 DIAGNOSIS — E782 Mixed hyperlipidemia: Secondary | ICD-10-CM | POA: Diagnosis not present

## 2020-05-19 DIAGNOSIS — F410 Panic disorder [episodic paroxysmal anxiety] without agoraphobia: Secondary | ICD-10-CM | POA: Diagnosis not present

## 2020-05-19 DIAGNOSIS — U071 COVID-19: Secondary | ICD-10-CM | POA: Diagnosis not present

## 2020-05-19 DIAGNOSIS — I10 Essential (primary) hypertension: Secondary | ICD-10-CM

## 2020-05-19 MED ORDER — METHYLPREDNISOLONE 4 MG PO TABS: ORAL_TABLET | ORAL | 0 refills | Status: DC

## 2020-05-19 MED ORDER — QVAR REDIHALER 40 MCG/ACT IN AERB: 2.0000 | INHALATION_SPRAY | Freq: Two times a day (BID) | RESPIRATORY_TRACT | 1 refills | Status: DC

## 2020-05-19 MED ORDER — ALBUTEROL SULFATE HFA 108 (90 BASE) MCG/ACT IN AERS
2.0000 | INHALATION_SPRAY | Freq: Four times a day (QID) | RESPIRATORY_TRACT | 1 refills | Status: DC | PRN
Start: 2020-05-19 — End: 2020-06-07

## 2020-05-19 MED FILL — ALBUTEROL SULFATE HFA 108 (: 108 (90 BAS | 16 days supply | Qty: 18 | Fill #0

## 2020-05-19 MED FILL — methylPREDNISolone 4 MG dos: 4 | 5 days supply | Qty: 15 | Fill #0

## 2020-05-19 MED FILL — QVAR REDIHALER 40 MCG/ACT A: 40 | 30 days supply | Qty: 11 | Fill #0

## 2020-05-19 NOTE — Progress Notes (Signed)
[facility] [address], [address] [place], [place] [address] [phone]  Date:  05/19/2020   Name:  Andrea Bowman   DOB:  08/11/1960   MRN:  [id number]  PCP:  Bradd Canary, MD    Chief Complaint: No chief complaint on file.   History of Present Illness:  Andrea Bowman is a 60 y.o. very pleasant female patient who presents with the following:  Primary patient of my partner Dr. Abner Greenspan, virtual visit today for illness  patient location is home, provider location is office Patient identity confirmed with 2 factors, she gives consent for virtual visit today.  Patient and myself are present on the call  I have not seen this patient myself previously She got sick this past Saturday- today is Thursday. Her sx are nasal congestion, headache, cough, dry mouth, nausea- nausea is now resolved She took her covid test Sunday- it was positive  She feels like she has "an extreme cold" She notes that she did have a possible fever 2 days ago- woke up sweaty.  She does have history of COPD obesity Smoker COVID-19 vaccine; 2 doses so far, did not get her booster yet   She has some qvar on hand that she has used-however, it is expired and she would like a new prescription She notes that "a cold will turn into bronchitis" for her and that she often will use an oral steroid in the situation She does note that she is wheezing with current illness She does have some albuterol on hand -also expired  She has an allergy listed to prednisone.  Asked patient about this, she says that her cheeks tend to turn red the day after her first dose, then this will resolve and she does not have further problems.  She does not notice any issue with inhaled Qvar, she also thinks that she has taken methylprednisolone without ill effect  Patient Active Problem List   Diagnosis Date Noted  . Suspected COVID-19 virus infection 08/08/2018  . Respiratory illness  with fever 08/03/2018  . Back pain 06/15/2018  . COPD (chronic obstructive pulmonary disease) (HCC) 03/27/2016  . Insomnia 11/14/2014  . Panic anxiety syndrome 08/09/2014  . Plantar wart, left foot 07/20/2014  . Acromioclavicular joint pain 07/20/2014  . Hyperglycemia 07/05/2013  . Obesity 02/27/2012  . Herpes labialis 02/06/2012  . Hyperlipidemia 02/06/2012  . Preventative health care 02/06/2012  . Thumb pain 08/26/2011  . Knee pain, right 08/20/2011  . Vitamin D deficiency 05/12/2010  . ALLERGIC RHINITIS, SEASONAL 05/12/2010  . Menopausal and postmenopausal disorder 05/12/2010  . MUMPS, HX OF 05/12/2010  . Personal history of urinary disorder 05/12/2010  . HEMATURIA, HX OF 05/12/2010  . CHICKENPOX, HX OF 05/12/2010  . PREMATURE VENTRICULAR CONTRACTIONS 03/15/2010  . TOBACCO ABUSE 11/30/2009  . ANXIETY DEPRESSION 11/29/2009    Past Medical History:  Diagnosis Date  . Acute bronchitis 08/24/2010  . Anxiety    panic attacks  . 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 Complication of anesthesia    hard to wake up post-op  . Dental bridge present    upper  . Dental infection    will finish antibiotic 05/01/2012  . Depression   . Insomnia 11/14/2014  . Osteoarthritis of knee    right  . Perimenopausal   . PVC (premature ventricular contraction)    takes Atenolol   . TOBACCO ABUSE 11/30/2009   Qualifier: Diagnosis of  ByJens Som, MD, Lyn Hollingshead   . 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 CHOLECYSTECTOMY  2007  . KNEE ARTHROSCOPY WITH LATERAL RELEASE  05/02/2012   Procedure: KNEE ARTHROSCOPY WITH LATERAL RELEASE;  Surgeon: Harvie Junior, MD;  Location: [facility];  Service: Orthopedics;  Laterality: Right;  chondroplasty with lateral release    Social History   Tobacco Use  . Smoking status: Current Every Day Smoker    Years: 30.00    Types:  Cigarettes  . Smokeless tobacco: Never Used  . Tobacco comment: 3 cig./day and several e-cigarettes/day  Substance Use Topics  . Alcohol use: Yes    Comment: seldom  . Drug use: No    Family History  Problem Relation Age of Onset  . Cancer Mother 53       Breast  . Hypertension Father   . Heart failure Father        CHF  . Depression Sister        previous history of  . Anxiety disorder Sister        Previous history of  . Cancer Sister        cancer, breast  . Depression Brother   . Anxiety disorder Brother   . Ulcers Brother        PUD  . Cancer Brother        skin  . Cancer Maternal Aunt        X several w/ breast cancer, various ages  . Cancer Paternal Uncle        X several w/ prostate cancer, 1 w/colon cancer  . Cancer Maternal Aunt        uterine  . Cancer Maternal Aunt        breast    Allergies  Allergen Reactions  . Cefdinir Hives  . Doxycycline Diarrhea  . Prednisone Other (See Comments)    REACTION: REDNESS OF FACE    Medication list has been reviewed and updated.  Current Outpatient Medications on File Prior to Visit  Medication Sig Dispense Refill  . acyclovir cream (ZOVIRAX) 5 % Apply 1 application topically every 3 (three) hours. 5 g 2  . albuterol (PROVENTIL HFA;VENTOLIN HFA) 108 (90 Base) MCG/ACT inhaler Inhale 2 puffs into the lungs every 6 (six) hours as needed for wheezing or shortness of breath. 18 g 5  . albuterol (VENTOLIN HFA) 108 (90 Base) MCG/ACT inhaler Inhale 2 puffs into the lungs every 6 (six) hours as needed for wheezing or shortness of breath. 1 Inhaler 0  . atenolol (TENORMIN) 25 MG tablet Take 1 tablet (25 mg total) by mouth daily. 90 tablet 1  . cholecalciferol (VITAMIN D) 1000 units tablet Take 1,000 Units by mouth 2 (two) times daily.    . clonazePAM (KLONOPIN) 0.5 MG tablet TAKE 1 TABLET BY MOUTH THREE TIMES A DAY AS NEEDED FOR ANXIETY 60 tablet 0  . escitalopram (LEXAPRO) 20 MG tablet Take 2 tablets (40 mg total) by mouth  daily. 180 tablet 1  . Esomeprazole Magnesium (NEXIUM PO) Take by mouth.    Marland Kitchen HYDROcodone-homatropine (HYDROMET) 5-1.5 MG/5ML syrup Take 5 mLs by mouth every 6 (six) hours as needed for cough. 150 mL 0  . ibuprofen (ADVIL,MOTRIN) 200 MG tablet Take 600 mg by mouth every 6 (six) hours as needed for pain.    Marland Kitchen ipratropium (ATROVENT HFA) 17 MCG/ACT inhaler Inhale 2 puffs into  the lungs every 6 (six) hours as needed for wheezing. 12.9 g 5  . methylPREDNISolone (MEDROL) 4 MG tablet 5 tab po qd X 1d then 4 tab po qd X 1d then 3 tab po qd X 1d then 2 tab po qd then 1 tab po qd 15 tablet 0  . phenazopyridine (PYRIDIUM) 200 MG tablet Take 1 tablet (200 mg total) by mouth 3 (three) times daily as needed for pain. 6 tablet 0  . QVAR REDIHALER 80 MCG/ACT inhaler TAKE 1 PUFF BY MOUTH TWICE A DAY 10.6 g 1  . sulfamethoxazole-trimethoprim (BACTRIM DS) 800-160 MG tablet Take 1 tablet by mouth 2 (two) times daily. 14 tablet 0  . tiZANidine (ZANAFLEX) 4 MG tablet Take 0.5-1 tablets (2-4 mg total) by mouth every 8 (eight) hours as needed for muscle spasms. 90 tablet 1  . vitamin B-12 (CYANOCOBALAMIN) 1000 MCG tablet Take 1,000 mcg by mouth daily.     No current facility-administered medications on file prior to visit.    Review of Systems:  As per HPI- otherwise negative.   Physical Examination: There were no vitals filed for this visit. There were no vitals filed for this visit. There is no height or weight on file to calculate BMI. Ideal Body Weight:    Her sats are about 94%- she notes that this is her baseline even when she is well  Connected with pt via video She looks well, no SOB or distress noted She is not checking other vitals at this time  Assessment and Plan: COVID-19 - Plan: albuterol (VENTOLIN HFA) 108 (90 Base) MCG/ACT inhaler, beclomethasone (QVAR REDIHALER) 40 MCG/ACT inhaler, methylPREDNISolone (MEDROL) 4 MG tablet  Panic anxiety syndrome  Hyperglycemia  Hyperlipidemia,  mixed  Benign essential HTN  Virtual visit today for concern of COVID-19.  Patient has been sick for about 5 days, tested positive for days ago.  Her symptoms are relatively minor, she is not in distress.  She is able to monitor her oxygen saturation I refilled her albuterol and Qvar inhalers.  Advised her to use these as needed for the next couple of weeks.  I did provide a prescription for Medrol oral steroid; advised patient that opinions are somewhat mixed on using steroids with milder COVID.  However, she would like to try this that is okay  I asked her to please let us know if she is getting worse at all, seek emergency care if any distress Video used for duration of visit today  Signed Abbe Amsterdam, MD

## 2020-06-07 ENCOUNTER — Telehealth (INDEPENDENT_AMBULATORY_CARE_PROVIDER_SITE_OTHER): Payer: Managed Care, Other (non HMO) | Admitting: Family Medicine

## 2020-06-07 ENCOUNTER — Other Ambulatory Visit: Payer: Self-pay

## 2020-06-07 ENCOUNTER — Other Ambulatory Visit: Payer: Self-pay | Admitting: Family Medicine

## 2020-06-07 ENCOUNTER — Encounter: Payer: Self-pay | Admitting: Family Medicine

## 2020-06-07 VITALS — HR 68

## 2020-06-07 DIAGNOSIS — F17200 Nicotine dependence, unspecified, uncomplicated: Secondary | ICD-10-CM

## 2020-06-07 DIAGNOSIS — E559 Vitamin D deficiency, unspecified: Secondary | ICD-10-CM

## 2020-06-07 DIAGNOSIS — U071 COVID-19: Secondary | ICD-10-CM

## 2020-06-07 DIAGNOSIS — G4700 Insomnia, unspecified: Secondary | ICD-10-CM

## 2020-06-07 DIAGNOSIS — F341 Dysthymic disorder: Secondary | ICD-10-CM

## 2020-06-07 DIAGNOSIS — E785 Hyperlipidemia, unspecified: Secondary | ICD-10-CM

## 2020-06-07 DIAGNOSIS — R739 Hyperglycemia, unspecified: Secondary | ICD-10-CM | POA: Diagnosis not present

## 2020-06-07 DIAGNOSIS — J301 Allergic rhinitis due to pollen: Secondary | ICD-10-CM

## 2020-06-07 DIAGNOSIS — E538 Deficiency of other specified B group vitamins: Secondary | ICD-10-CM | POA: Insufficient documentation

## 2020-06-07 MED ORDER — VENLAFAXINE HCL ER 75 MG PO CP24: 75.0000 mg | ORAL_CAPSULE | Freq: Every day | ORAL | 2 refills | Status: DC

## 2020-06-07 MED ORDER — CLONAZEPAM 0.5 MG PO TABS: ORAL_TABLET | ORAL | 2 refills | Status: DC

## 2020-06-07 MED ORDER — VENLAFAXINE HCL ER 37.5 MG PO CP24: 37.5000 mg | ORAL_CAPSULE | Freq: Every day | ORAL | 0 refills | Status: DC

## 2020-06-07 MED FILL — VENLAFAXINE HCL ER 75 MG CA: 75 | 30 days supply | Qty: 30 | Fill #0

## 2020-06-07 MED FILL — VENLAFAXINE HCL ER 37.5 MG: 37.5 | 7 days supply | Qty: 7 | Fill #0

## 2020-06-07 MED FILL — clonazePAM 0.5 MG TABS: 0.5 | 20 days supply | Qty: 60 | Fill #0

## 2020-06-07 NOTE — Assessment & Plan Note (Signed)
Encouraged heart healthy diet, increase exercise, avoid trans fats, consider a krill oil cap daily 

## 2020-06-07 NOTE — Assessment & Plan Note (Signed)
Supplement and monitor 

## 2020-06-07 NOTE — Assessment & Plan Note (Signed)
hgba1c acceptable, minimize simple carbs. Increase exercise as tolerated.  

## 2020-06-07 NOTE — Assessment & Plan Note (Signed)
Since having COVID this has worsened and she has had to use Klonopin qhs and that does help. She is having more trouble falling asleep and staying asleep

## 2020-06-07 NOTE — Assessment & Plan Note (Signed)
Was elevated on last check will recheck and monitor

## 2020-06-12 NOTE — Assessment & Plan Note (Signed)
Struggled with fatigue, myalgias, headaches for awhile. She had a fever for just one day. Does feel fully recovered

## 2020-06-12 NOTE — Assessment & Plan Note (Signed)
Encouraged complete cessation. Discussed need to quit as relates to risk of numerous cancers, cardiac and pulmonary disease as well as neurologic complications. Counseled for greater than 3 minutes 

## 2020-06-12 NOTE — Progress Notes (Signed)
Virtual Visit via Video Note  I connected with Andrea Bowman on 3/1/22v  at 10:20 AM EST by a video enabled telemedicine application and verified that I am speaking with the correct person using two identifiers.  Location: Patient: home, patient and provider in visit Provider: home   I discussed the limitations of evaluation and management by telemedicine and the availability of in person appointments. The patient expressed understanding and agreed to proceed.     Subjective:    Patient ID: Andrea Bowman, female    DOB: 1960/11/23, 60 y.o.   MRN: [id number]  Chief Complaint  Patient presents with  . Medication Refill    HPI Patient is in today for follow up on chronic medical cocerns. She is recovering from COVID since returning from [place] and is doing well now. It remained mostly in her head although she did have a mild wheeze. She notes myalgias, headaches, congestions, fever, cough, chest tightness and dry mouth. Her taste was not gone but has remained a bit off . She also notes an increase in headaches and anxiety. Denies CP/palp/SOB/congestion/fevers/GI or GU c/o. Taking meds as prescribed  Past Medical History:  Diagnosis Date  . Acute bronchitis 08/24/2010  . Anxiety    panic attacks  . 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 Complication of anesthesia    hard to wake up post-op  . Dental bridge present    upper  . Dental infection    will finish antibiotic 05/01/2012  . Depression   . Insomnia 11/14/2014  . Osteoarthritis of knee    right  . Perimenopausal   . PVC (premature ventricular contraction)    takes Atenolol   . TOBACCO ABUSE 11/30/2009   Qualifier: Diagnosis of  By: Jens Som, MD, Lyn Hollingshead   . 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 CHOLECYSTECTOMY  2007  . KNEE ARTHROSCOPY WITH LATERAL RELEASE  05/02/2012   Procedure: KNEE ARTHROSCOPY WITH  LATERAL RELEASE;  Surgeon: Harvie Junior, MD;  Location: [facility];  Service: Orthopedics;  Laterality: Right;  chondroplasty with lateral release    Family History  Problem Relation Age of Onset  . Cancer Mother 101       Breast  . Hypertension Father   . Heart failure Father        CHF  . Depression Sister        previous history of  . Anxiety disorder Sister        Previous history of  . Cancer Sister        cancer, breast  . Depression Brother   . Anxiety disorder Brother   . Ulcers Brother        PUD  . Cancer Brother        skin  . Cancer Maternal Aunt        X several w/ breast cancer, various ages  . Cancer Paternal Uncle        X several w/ prostate cancer, 1 w/colon cancer  . Cancer Maternal Aunt        uterine  . Cancer Maternal Aunt        breast    Social History   Socioeconomic History  . Marital status: Married    Spouse name: Not on file  . Number of children: 1  . Years of education: Not on file  . Highest education level: Not on  file  Occupational History    Comment: home  Tobacco Use  . Smoking status: Current Every Day Smoker    Years: 30.00    Types: Cigarettes  . Smokeless tobacco: Never Used  . Tobacco comment: 3 cig./day and several e-cigarettes/day  Substance and Sexual Activity  . Alcohol use: Yes    Comment: seldom  . Drug use: No  . Sexual activity: Yes    Partners: Male    Comment: is separted from husband, he is out of the country and seeing someone else  Other Topics Concern  . Not on file  Social History Narrative   Relocated from [place]. Lives with husband. Home maker.   Social Determinants of Health   Financial Resource Strain: Not on file  Food Insecurity: Not on file  Transportation Needs: Not on file  Physical Activity: Not on file  Stress: Not on file  Social Connections: Not on file  Intimate Partner Violence: Not on file    Outpatient Medications Prior to Visit  Medication Sig Dispense Refill   . acyclovir cream (ZOVIRAX) 5 % Apply 1 application topically every 3 (three) hours. 5 g 2  . albuterol (VENTOLIN HFA) 108 (90 Base) MCG/ACT inhaler Inhale 2 puffs into the lungs every 6 (six) hours as needed for wheezing or shortness of breath. 1 Inhaler 0  . atenolol (TENORMIN) 25 MG tablet Take 1 tablet (25 mg total) by mouth daily. 90 tablet 1  . beclomethasone (QVAR REDIHALER) 40 MCG/ACT inhaler Inhale 2 puffs into the lungs 2 (two) times daily. 1 each 1  . cholecalciferol (VITAMIN D) 1000 units tablet Take 1,000 Units by mouth 2 (two) times daily.    . Esomeprazole Magnesium (NEXIUM PO) Take by mouth.    Marland Kitchen. ibuprofen (ADVIL,MOTRIN) 200 MG tablet Take 600 mg by mouth every 6 (six) hours as needed for pain.    Marland Kitchen. ipratropium (ATROVENT HFA) 17 MCG/ACT inhaler Inhale 2 puffs into the lungs every 6 (six) hours as needed for wheezing. 12.9 g 5  . methylPREDNISolone (MEDROL) 4 MG tablet 5 tab po qd X 1d then 4 tab po qd X 1d then 3 tab po qd X 1d then 2 tab po qd then 1 tab po qd 15 tablet 0  . phenazopyridine (PYRIDIUM) 200 MG tablet Take 1 tablet (200 mg total) by mouth 3 (three) times daily as needed for pain. 6 tablet 0  . QVAR REDIHALER 80 MCG/ACT inhaler TAKE 1 PUFF BY MOUTH TWICE A DAY 10.6 g 1  . tiZANidine (ZANAFLEX) 4 MG tablet Take 0.5-1 tablets (2-4 mg total) by mouth every 8 (eight) hours as needed for muscle spasms. 90 tablet 1  . vitamin B-12 (CYANOCOBALAMIN) 1000 MCG tablet Take 1,000 mcg by mouth daily.    Marland Kitchen. albuterol (VENTOLIN HFA) 108 (90 Base) MCG/ACT inhaler Inhale 2 puffs into the lungs every 6 (six) hours as needed for wheezing or shortness of breath. 18 g 1  . clonazePAM (KLONOPIN) 0.5 MG tablet TAKE 1 TABLET BY MOUTH THREE TIMES A DAY AS NEEDED FOR ANXIETY 60 tablet 0  . escitalopram (LEXAPRO) 20 MG tablet Take 2 tablets (40 mg total) by mouth daily. 180 tablet 1  . HYDROcodone-homatropine (HYDROMET) 5-1.5 MG/5ML syrup Take 5 mLs by mouth every 6 (six) hours as needed for  cough. 150 mL 0  . sulfamethoxazole-trimethoprim (BACTRIM DS) 800-160 MG tablet Take 1 tablet by mouth 2 (two) times daily. 14 tablet 0   No facility-administered medications prior to visit.  Allergies  Allergen Reactions  . Cefdinir Hives  . Doxycycline Diarrhea  . Prednisone Other (See Comments)    REACTION: REDNESS OF FACE    Review of Systems  Constitutional: Negative for fever and malaise/fatigue.  HENT: Negative for congestion.   Eyes: Negative for blurred vision.  Respiratory: Positive for cough and sputum production. Negative for shortness of breath.   Cardiovascular: Negative for chest pain, palpitations and leg swelling.  Gastrointestinal: Negative for abdominal pain, blood in stool and nausea.  Genitourinary: Negative for dysuria and frequency.  Musculoskeletal: Negative for falls.  Skin: Negative for rash.  Neurological: Negative for dizziness, loss of consciousness and headaches.  Endo/Heme/Allergies: Negative for environmental allergies.  Psychiatric/Behavioral: Negative for depression. The patient is nervous/anxious and has insomnia.        Objective:    Physical Exam Vitals and nursing note reviewed.  Constitutional:      General: She is not in acute distress.    Appearance: Normal appearance. She is well-developed and well-nourished. She is obese.  HENT:     Head: Normocephalic and atraumatic.     Right Ear: There is no impacted cerumen.     Left Ear: There is no impacted cerumen.     Nose: Nose normal.  Eyes:     General:        Right eye: No discharge.        Left eye: No discharge.  Cardiovascular:     Rate and Rhythm: Normal rate and regular rhythm.     Heart sounds: No murmur heard.   Pulmonary:     Effort: Pulmonary effort is normal.     Breath sounds: Normal breath sounds.  Abdominal:     General: Bowel sounds are normal.     Palpations: Abdomen is soft.     Tenderness: There is no abdominal tenderness.  Musculoskeletal:         General: No edema.     Cervical back: Normal range of motion and neck supple.  Skin:    General: Skin is warm and dry.  Neurological:     Mental Status: She is alert and oriented to person, place, and time.  Psychiatric:        Mood and Affect: Mood and affect normal.     Pulse 68   SpO2 95%  Wt Readings from Last 3 Encounters:  04/09/19 191 lb (86.6 kg)  07/31/18 190 lb (86.2 kg)  06/12/18 193 lb 3.2 oz (87.6 kg)    Diabetic Foot Exam - Simple   No data filed    Lab Results  Component Value Date   WBC 6.2 04/23/2019   HGB 14.5 04/23/2019   HCT 43.9 04/23/2019   PLT 246.0 04/23/2019   GLUCOSE 104 (H) 04/23/2019   CHOL 195 04/23/2019   TRIG 120.0 04/23/2019   HDL 54.70 04/23/2019   LDLDIRECT 144.2 01/30/2012   LDLCALC 116 (H) 04/23/2019   ALT 17 04/23/2019   AST 17 04/23/2019   NA 138 04/23/2019   K 4.5 04/23/2019   CL 105 04/23/2019   CREATININE 0.79 04/23/2019   BUN 15 04/23/2019   CO2 29 04/23/2019   TSH 1.50 04/23/2019   INR 1.0 06/16/2012   HGBA1C 6.0 04/23/2019    Lab Results  Component Value Date   TSH 1.50 04/23/2019   Lab Results  Component Value Date   WBC 6.2 04/23/2019   HGB 14.5 04/23/2019   HCT 43.9 04/23/2019   MCV 97.6 04/23/2019   PLT 246.0  04/23/2019   Lab Results  Component Value Date   NA 138 04/23/2019   K 4.5 04/23/2019   CO2 29 04/23/2019   GLUCOSE 104 (H) 04/23/2019   BUN 15 04/23/2019   CREATININE 0.79 04/23/2019   BILITOT 0.4 04/23/2019   ALKPHOS 55 04/23/2019   AST 17 04/23/2019   ALT 17 04/23/2019   PROT 6.5 04/23/2019   ALBUMIN 4.1 04/23/2019   CALCIUM 9.1 04/23/2019   GFR 74.09 04/23/2019   Lab Results  Component Value Date   CHOL 195 04/23/2019   Lab Results  Component Value Date   HDL 54.70 04/23/2019   Lab Results  Component Value Date   LDLCALC 116 (H) 04/23/2019   Lab Results  Component Value Date   TRIG 120.0 04/23/2019   Lab Results  Component Value Date   CHOLHDL 4 04/23/2019   Lab  Results  Component Value Date   HGBA1C 6.0 04/23/2019       Assessment & Plan:   Problem List Items Addressed This Visit    ANXIETY DEPRESSION   Relevant Medications   clonazePAM (KLONOPIN) 0.5 MG tablet   venlafaxine XR (EFFEXOR XR) 37.5 MG 24 hr capsule   venlafaxine XR (EFFEXOR XR) 75 MG 24 hr capsule   TOBACCO ABUSE - Primary    Encouraged complete cessation. Discussed need to quit as relates to risk of numerous cancers, cardiac and pulmonary disease as well as neurologic complications. Counseled for greater than 3 minutes      Vitamin D deficiency    Supplement and monitor      Relevant Orders   VITAMIN D 25 Hydroxy (Vit-D Deficiency, Fractures)   ALLERGIC RHINITIS, SEASONAL   Relevant Orders   CBC   Hyperlipidemia    Encouraged heart healthy diet, increase exercise, avoid trans fats, consider a krill oil cap daily      Relevant Orders   Lipid panel   Hyperglycemia    hgba1c acceptable, minimize simple carbs. Increase exercise as tolerated.       Relevant Orders   Hemoglobin A1c   Comprehensive metabolic panel   Insomnia    Since having COVID this has worsened and she has had to use Klonopin qhs and that does help. She is having more trouble falling asleep and staying asleep      Relevant Orders   TSH   COVID-19    Struggled with fatigue, myalgias, headaches for awhile. She had a fever for just one day. Does feel fully recovered      Disorder of vitamin B12    Was elevated on last check will recheck and monitor      Relevant Orders   Vitamin B12      I have discontinued Raynelle Fanning A. Cancelliere's HYDROcodone-homatropine, sulfamethoxazole-trimethoprim, and escitalopram. I am also having her start on venlafaxine XR and venlafaxine XR. Additionally, I am having her maintain her ibuprofen, Esomeprazole Magnesium (NEXIUM PO), cholecalciferol, vitamin B-12, ipratropium, acyclovir cream, tiZANidine, phenazopyridine, albuterol, Qvar RediHaler, atenolol, Qvar RediHaler,  methylPREDNISolone, and clonazePAM.  Meds ordered this encounter  Medications  . clonazePAM (KLONOPIN) 0.5 MG tablet    Sig: TAKE 1 TABLET BY MOUTH THREE TIMES A DAY AS NEEDED FOR ANXIETY    Dispense:  60 tablet    Refill:  2  . venlafaxine XR (EFFEXOR XR) 37.5 MG 24 hr capsule    Sig: Take 1 capsule (37.5 mg total) by mouth daily with breakfast.    Dispense:  7 capsule    Refill:  0  .  venlafaxine XR (EFFEXOR XR) 75 MG 24 hr capsule    Sig: Take 1 capsule (75 mg total) by mouth daily with breakfast.    Dispense:  30 capsule    Refill:  2    Start after 1 week on 37.5 mg dose     I discussed the assessment and treatment plan with the patient. The patient was provided an opportunity to ask questions and all were answered. The patient agreed with the plan and demonstrated an understanding of the instructions.   The patient was advised to call back or seek an in-person evaluation if the symptoms worsen or if the condition fails to improve as anticipated.  I provided 20 minutes of non-face-to-face time during this encounter.   Danise Edge, MD

## 2020-06-15 MED FILL — VENLAFAXINE HCL ER 37.5 MG: 37.5 | 7 days supply | Qty: 7 | Fill #0

## 2020-06-15 MED FILL — clonazePAM 0.5 MG TABS: 0.5 | 20 days supply | Qty: 60 | Fill #0

## 2020-06-15 MED FILL — VENLAFAXINE HCL ER 75 MG CA: 75 | 30 days supply | Qty: 30 | Fill #0

## 2020-06-20 ENCOUNTER — Other Ambulatory Visit: Payer: Managed Care, Other (non HMO)

## 2020-07-04 MED FILL — ATENOLOL 25 MG TABLET: 25 | 90 days supply | Qty: 90 | Fill #1

## 2020-08-16 ENCOUNTER — Other Ambulatory Visit (HOSPITAL_BASED_OUTPATIENT_CLINIC_OR_DEPARTMENT_OTHER): Payer: Self-pay

## 2020-08-16 MED FILL — Clonazepam Tab 0.5 MG: ORAL | 20 days supply | Qty: 60 | Fill #0 | Status: AC

## 2020-08-30 ENCOUNTER — Telehealth (INDEPENDENT_AMBULATORY_CARE_PROVIDER_SITE_OTHER): Payer: Managed Care, Other (non HMO) | Admitting: Family Medicine

## 2020-08-30 ENCOUNTER — Other Ambulatory Visit (HOSPITAL_BASED_OUTPATIENT_CLINIC_OR_DEPARTMENT_OTHER): Payer: Self-pay

## 2020-08-30 ENCOUNTER — Other Ambulatory Visit: Payer: Self-pay

## 2020-08-30 VITALS — HR 74

## 2020-08-30 DIAGNOSIS — E785 Hyperlipidemia, unspecified: Secondary | ICD-10-CM | POA: Diagnosis not present

## 2020-08-30 DIAGNOSIS — R739 Hyperglycemia, unspecified: Secondary | ICD-10-CM | POA: Diagnosis not present

## 2020-08-30 DIAGNOSIS — G4481 Hypnic headache: Secondary | ICD-10-CM

## 2020-08-30 DIAGNOSIS — R059 Cough, unspecified: Secondary | ICD-10-CM | POA: Diagnosis not present

## 2020-08-30 DIAGNOSIS — J449 Chronic obstructive pulmonary disease, unspecified: Secondary | ICD-10-CM

## 2020-08-30 DIAGNOSIS — R519 Headache, unspecified: Secondary | ICD-10-CM | POA: Insufficient documentation

## 2020-08-30 DIAGNOSIS — F17200 Nicotine dependence, unspecified, uncomplicated: Secondary | ICD-10-CM

## 2020-08-30 DIAGNOSIS — Z8616 Personal history of COVID-19: Secondary | ICD-10-CM

## 2020-08-30 MED ORDER — SULFAMETHOXAZOLE-TRIMETHOPRIM 800-160 MG PO TABS
1.0000 | ORAL_TABLET | Freq: Two times a day (BID) | ORAL | 0 refills | Status: DC
  Filled 2020-08-30: qty 20, 10d supply, fill #0

## 2020-08-30 NOTE — Assessment & Plan Note (Signed)
Encouraged heart healthy diet, increase exercise, avoid trans fats, consider a krill oil cap daily 

## 2020-08-30 NOTE — Assessment & Plan Note (Signed)
hgba1c acceptable, minimize simple carbs. Increase exercise as tolerated.  

## 2020-08-30 NOTE — Assessment & Plan Note (Signed)
Well controlled, no changes to meds. Encouraged heart healthy diet such as the DASH diet and exercise as tolerated.  °

## 2020-08-31 DIAGNOSIS — Z8616 Personal history of COVID-19: Secondary | ICD-10-CM | POA: Insufficient documentation

## 2020-08-31 NOTE — Assessment & Plan Note (Signed)
With worsening cough over last month. Is started on Bactrim and referred to pulmonology for further evaluation. Encouraged increased rest and hydration, add probiotics, zinc such as Coldeze or Xicam. Treat fevers as needed, plain Mucinex bid

## 2020-08-31 NOTE — Progress Notes (Signed)
Virtual telephone visit    Virtual Visit via Telephone Note   This visit type was conducted due to national recommendations for restrictions regarding the COVID-19 Pandemic (e.g. social distancing) in an effort to limit this patient's exposure and mitigate transmission in our community. Due to her co-morbid illnesses, this patient is at least at moderate risk for complications without adequate follow up. This format is felt to be most appropriate for this patient at this time. The patient did not have access to video technology or had technical difficulties with video requiring transitioning to audio format only (telephone). Physical exam was limited to content and character of the telephone converstion. S Chism. CMA was able to get the patient set up on a telephone visit.   Patient location: home Patient and provider in visit Provider location: Office  I discussed the limitations of evaluation and management by telemedicine and the availability of in person appointments. The patient expressed understanding and agreed to proceed.   Visit Date: 08/30/2020  Today's healthcare provider: Danise Edge, MD     Subjective:    Patient ID: Andrea Bowman, female    DOB: 11/20/1960, 60 y.o.   MRN: [id number]  No chief complaint on file.   HPI Patient is in today for follow up on chronic medical concerns such as COPD, hyperlipidemia and hyperglycemia. No recent febrile illness or hospitalizations but she has noted an increase in her cough, SOB congestion over the past week. She notes some chills as well. She has been very stressed and not taking care of herself well due to family issues including her sister being sick with long COVID. Denies CP/palp/fevers/GI or GU c/o. Taking meds as prescribed.  Past Medical History:  Diagnosis Date  . Acute bronchitis 08/24/2010  . Anxiety    panic attacks  . 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 Complication of anesthesia    hard to wake up  post-op  . Dental bridge present    upper  . Dental infection    will finish antibiotic 05/01/2012  . Depression   . Insomnia 11/14/2014  . Osteoarthritis of knee    right  . Perimenopausal   . PVC (premature ventricular contraction)    takes Atenolol   . TOBACCO ABUSE 11/30/2009   Qualifier: Diagnosis of  By: Jens Som, MD, Lyn Hollingshead   . 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 CHOLECYSTECTOMY  2007  . KNEE ARTHROSCOPY WITH LATERAL RELEASE  05/02/2012   Procedure: KNEE ARTHROSCOPY WITH LATERAL RELEASE;  Surgeon: Harvie Junior, MD;  Location: [facility];  Service: Orthopedics;  Laterality: Right;  chondroplasty with lateral release    Family History  Problem Relation Age of Onset  . Cancer Mother 26       Breast  . Hypertension Father   . Heart failure Father        CHF  . Depression Sister        previous history of  . Anxiety disorder Sister        Previous history of  . Cancer Sister        cancer, breast  . Depression Brother   . Anxiety disorder Brother   . Ulcers Brother        PUD  . Cancer Brother        skin  . Cancer Maternal Aunt        X  several w/ breast cancer, various ages  . Cancer Paternal Uncle        X several w/ prostate cancer, 1 w/colon cancer  . Cancer Maternal Aunt        uterine  . Cancer Maternal Aunt        breast    Social History   Socioeconomic History  . Marital status: Married    Spouse name: Not on file  . Number of children: 1  . Years of education: Not on file  . Highest education level: Not on file  Occupational History    Comment: home  Tobacco Use  . Smoking status: Current Every Day Smoker    Years: 30.00    Types: Cigarettes  . Smokeless tobacco: Never Used  . Tobacco comment: 3 cig./day and several e-cigarettes/day  Substance and Sexual Activity  . Alcohol use: Yes    Comment: seldom  . Drug  use: No  . Sexual activity: Yes    Partners: Male    Comment: is separted from husband, he is out of the country and seeing someone else  Other Topics Concern  . Not on file  Social History Narrative   Relocated from [place]. Lives with husband. Home maker.   Social Determinants of Health   Financial Resource Strain: Not on file  Food Insecurity: Not on file  Transportation Needs: Not on file  Physical Activity: Not on file  Stress: Not on file  Social Connections: Not on file  Intimate Partner Violence: Not on file    Outpatient Medications Prior to Visit  Medication Sig Dispense Refill  . acyclovir cream (ZOVIRAX) 5 % Apply 1 application topically every 3 (three) hours. 5 g 2  . albuterol (VENTOLIN HFA) 108 (90 Base) MCG/ACT inhaler Inhale 2 puffs into the lungs every 6 (six) hours as needed for wheezing or shortness of breath. 1 Inhaler 0  . atenolol (TENORMIN) 25 MG tablet TAKE 1 TABLET BY MOUTH ONCE DAILY 90 tablet 1  . beclomethasone (QVAR) 40 MCG/ACT inhaler INHALE 2 PUFFS INTO THE LUNGS 2 (TWO) TIMES DAILY. 10.6 g 1  . cholecalciferol (VITAMIN D) 1000 units tablet Take 1,000 Units by mouth 2 (two) times daily.    . clonazePAM (KLONOPIN) 0.5 MG tablet TAKE 1 TABLET BY MOUTH THREE TIMES A DAY AS NEEDED FOR ANXIETY 60 tablet 2  . Esomeprazole Magnesium (NEXIUM PO) Take by mouth.    Marland Kitchen ibuprofen (ADVIL,MOTRIN) 200 MG tablet Take 600 mg by mouth every 6 (six) hours as needed for pain.    Marland Kitchen ipratropium (ATROVENT HFA) 17 MCG/ACT inhaler Inhale 2 puffs into the lungs every 6 (six) hours as needed for wheezing. 12.9 g 5  . phenazopyridine (PYRIDIUM) 200 MG tablet Take 1 tablet (200 mg total) by mouth 3 (three) times daily as needed for pain. 6 tablet 0  . QVAR REDIHALER 80 MCG/ACT inhaler TAKE 1 PUFF BY MOUTH TWICE A DAY 10.6 g 1  . tiZANidine (ZANAFLEX) 4 MG tablet Take 0.5-1 tablets (2-4 mg total) by mouth every 8 (eight) hours as needed for muscle spasms. 90 tablet 1  . venlafaxine  XR (EFFEXOR-XR) 37.5 MG 24 hr capsule TAKE 1 CAPSULE (37.5 MG TOTAL) BY MOUTH DAILY WITH BREAKFAST. 7 capsule 0  . venlafaxine XR (EFFEXOR-XR) 75 MG 24 hr capsule TAKE 1 CAPSULE (75 MG TOTAL) BY MOUTH DAILY WITH BREAKFAST. *START AFTER 1 WEEK ON 37.5MG  DOSE* 30 capsule 2  . vitamin B-12 (CYANOCOBALAMIN) 1000 MCG tablet Take 1,000 mcg by  mouth daily.    . methylPREDNISolone (MEDROL DOSEPAK) 4 MG TBPK tablet TAKE 5 TABLETS BY MOUTH FOR 1 DAY, THEN 4 TABS FOR 1 DAY, THEN 3 TABS FOR 1 DAY THEN 2 TABS FOR 1 DAY THEN 1 TAB DAILY. 15 each 0  . methylPREDNISolone (MEDROL) 4 MG tablet 5 tab po qd X 1d then 4 tab po qd X 1d then 3 tab po qd X 1d then 2 tab po qd then 1 tab po qd 15 tablet 0   No facility-administered medications prior to visit.    Allergies  Allergen Reactions  . Cefdinir Hives  . Doxycycline Diarrhea  . Prednisone Other (See Comments)    REACTION: REDNESS OF FACE    Review of Systems  Constitutional: Positive for chills. Negative for fever.  HENT: Positive for congestion.   Eyes: Negative for blurred vision.  Respiratory: Positive for cough. Negative for shortness of breath.   Cardiovascular: Negative for chest pain, palpitations and leg swelling.  Gastrointestinal: Negative for abdominal pain, blood in stool and nausea.  Genitourinary: Negative for dysuria and frequency.  Musculoskeletal: Negative for falls.  Skin: Negative for rash.  Neurological: Negative for dizziness, loss of consciousness and headaches.  Endo/Heme/Allergies: Negative for environmental allergies.  Psychiatric/Behavioral: Positive for depression. The patient is nervous/anxious.        Objective:    Physical Exam unable to obtain via phone visit  Pulse 74   SpO2 95%  Wt Readings from Last 3 Encounters:  04/09/19 191 lb (86.6 kg)  07/31/18 190 lb (86.2 kg)  06/12/18 193 lb 3.2 oz (87.6 kg)    Diabetic Foot Exam - Simple   No data filed    Lab Results  Component Value Date   WBC 6.2  04/23/2019   HGB 14.5 04/23/2019   HCT 43.9 04/23/2019   PLT 246.0 04/23/2019   GLUCOSE 104 (H) 04/23/2019   CHOL 195 04/23/2019   TRIG 120.0 04/23/2019   HDL 54.70 04/23/2019   LDLDIRECT 144.2 01/30/2012   LDLCALC 116 (H) 04/23/2019   ALT 17 04/23/2019   AST 17 04/23/2019   NA 138 04/23/2019   K 4.5 04/23/2019   CL 105 04/23/2019   CREATININE 0.79 04/23/2019   BUN 15 04/23/2019   CO2 29 04/23/2019   TSH 1.50 04/23/2019   INR 1.0 06/16/2012   HGBA1C 6.0 04/23/2019    Lab Results  Component Value Date   TSH 1.50 04/23/2019   Lab Results  Component Value Date   WBC 6.2 04/23/2019   HGB 14.5 04/23/2019   HCT 43.9 04/23/2019   MCV 97.6 04/23/2019   PLT 246.0 04/23/2019   Lab Results  Component Value Date   NA 138 04/23/2019   K 4.5 04/23/2019   CO2 29 04/23/2019   GLUCOSE 104 (H) 04/23/2019   BUN 15 04/23/2019   CREATININE 0.79 04/23/2019   BILITOT 0.4 04/23/2019   ALKPHOS 55 04/23/2019   AST 17 04/23/2019   ALT 17 04/23/2019   PROT 6.5 04/23/2019   ALBUMIN 4.1 04/23/2019   CALCIUM 9.1 04/23/2019   GFR 74.09 04/23/2019   Lab Results  Component Value Date   CHOL 195 04/23/2019   Lab Results  Component Value Date   HDL 54.70 04/23/2019   Lab Results  Component Value Date   LDLCALC 116 (H) 04/23/2019   Lab Results  Component Value Date   TRIG 120.0 04/23/2019   Lab Results  Component Value Date   CHOLHDL 4 04/23/2019   Lab  Results  Component Value Date   HGBA1C 6.0 04/23/2019       Assessment & Plan:   Problem List Items Addressed This Visit    TOBACCO ABUSE    Needs complete cessation. Discussed need to quit as relates to risk of numerous cancers, cardiac and pulmonary disease as well as neurologic complications. Counseled for greater than 3 minutes      Hyperlipidemia    Encouraged heart healthy diet, increase exercise, avoid trans fats, consider a krill oil cap daily      Hyperglycemia    hgba1c acceptable, minimize simple carbs.  Increase exercise as tolerated.      COPD (chronic obstructive pulmonary disease) (HCC)    With worsening cough over last month. Is started on Bactrim and referred to pulmonology for further evaluation. Encouraged increased rest and hydration, add probiotics, zinc such as Coldeze or Xicam. Treat fevers as needed, plain Mucinex bid      Headache    Well controlled, no changes to meds. Encouraged heart healthy diet such as the DASH diet and exercise as tolerated.       History of COVID-19    Had covid in 2020       Other Visit Diagnoses    Cough    -  Primary   Relevant Orders   DG Chest 2 View   Ambulatory referral to Pulmonology      I have discontinued Andrea Bowman's methylPREDNISolone and methylPREDNISolone. I am also having her start on sulfamethoxazole-trimethoprim. Additionally, I am having her maintain her ibuprofen, Esomeprazole Magnesium (NEXIUM PO), cholecalciferol, vitamin B-12, ipratropium, acyclovir cream, tiZANidine, phenazopyridine, albuterol, Qvar RediHaler, venlafaxine XR, venlafaxine XR, clonazePAM, beclomethasone, and atenolol.  Meds ordered this encounter  Medications  . sulfamethoxazole-trimethoprim (BACTRIM DS) 800-160 MG tablet    Sig: Take 1 tablet by mouth 2 (two) times daily.    Dispense:  20 tablet    Refill:  0     I discussed the assessment and treatment plan with the patient. The patient was provided an opportunity to ask questions and all were answered. The patient agreed with the plan and demonstrated an understanding of the instructions.   The patient was advised to call back or seek an in-person evaluation if the symptoms worsen or if the condition fails to improve as anticipated.  I provided 25 minutes of non-face-to-face time during this encounter.   Danise EdgeStacey Easton Sivertson, MD [facility] [phone] (phone) [phone] (fax)  [facility]

## 2020-08-31 NOTE — Assessment & Plan Note (Signed)
Needs complete cessation. Discussed need to quit as relates to risk of numerous cancers, cardiac and pulmonary disease as well as neurologic complications. Counseled for greater than 3 minutes

## 2020-08-31 NOTE — Assessment & Plan Note (Signed)
Had covid in 2020

## 2020-09-01 ENCOUNTER — Encounter: Payer: Self-pay | Admitting: Family Medicine

## 2020-09-01 ENCOUNTER — Encounter (HOSPITAL_BASED_OUTPATIENT_CLINIC_OR_DEPARTMENT_OTHER): Payer: Self-pay

## 2020-09-01 ENCOUNTER — Ambulatory Visit (HOSPITAL_BASED_OUTPATIENT_CLINIC_OR_DEPARTMENT_OTHER)
Admission: RE | Admit: 2020-09-01 | Discharge: 2020-09-01 | Disposition: A | Discharge status: Home / Self Care | Payer: Managed Care, Other (non HMO) | Source: Ambulatory Visit | Attending: Family Medicine | Admitting: Family Medicine

## 2020-09-01 ENCOUNTER — Other Ambulatory Visit: Payer: Self-pay

## 2020-09-01 ENCOUNTER — Other Ambulatory Visit (HOSPITAL_BASED_OUTPATIENT_CLINIC_OR_DEPARTMENT_OTHER): Payer: Self-pay | Admitting: Family Medicine

## 2020-09-01 ENCOUNTER — Other Ambulatory Visit (INDEPENDENT_AMBULATORY_CARE_PROVIDER_SITE_OTHER): Payer: Managed Care, Other (non HMO)

## 2020-09-01 DIAGNOSIS — E785 Hyperlipidemia, unspecified: Secondary | ICD-10-CM | POA: Diagnosis not present

## 2020-09-01 DIAGNOSIS — E559 Vitamin D deficiency, unspecified: Secondary | ICD-10-CM

## 2020-09-01 DIAGNOSIS — Z1231 Encounter for screening mammogram for malignant neoplasm of breast: Secondary | ICD-10-CM

## 2020-09-01 DIAGNOSIS — E538 Deficiency of other specified B group vitamins: Secondary | ICD-10-CM | POA: Diagnosis not present

## 2020-09-01 DIAGNOSIS — Z0000 Encounter for general adult medical examination without abnormal findings: Secondary | ICD-10-CM

## 2020-09-01 DIAGNOSIS — R739 Hyperglycemia, unspecified: Secondary | ICD-10-CM | POA: Diagnosis not present

## 2020-09-01 DIAGNOSIS — G4700 Insomnia, unspecified: Secondary | ICD-10-CM | POA: Diagnosis not present

## 2020-09-01 DIAGNOSIS — R059 Cough, unspecified: Secondary | ICD-10-CM | POA: Diagnosis present

## 2020-09-01 DIAGNOSIS — J301 Allergic rhinitis due to pollen: Secondary | ICD-10-CM | POA: Diagnosis not present

## 2020-09-01 LAB — HEMOGLOBIN A1C: Hgb A1c MFr Bld: 6.1 % (ref 4.6–6.5)

## 2020-09-01 LAB — CBC
HCT: 42.4 % (ref 36.0–46.0)
Hemoglobin: 14.5 g/dL (ref 12.0–15.0)
MCHC: 34.3 g/dL (ref 30.0–36.0)
MCV: 96.9 fl (ref 78.0–100.0)
Platelets: 262 10*3/uL (ref 150.0–400.0)
RBC: 4.37 Mil/uL (ref 3.87–5.11)
RDW: 13.1 % (ref 11.5–15.5)
WBC: 8.1 10*3/uL (ref 4.0–10.5)

## 2020-09-01 LAB — VITAMIN D 25 HYDROXY (VIT D DEFICIENCY, FRACTURES): VITD: 32.11 ng/mL (ref 30.00–100.00)

## 2020-09-01 LAB — COMPREHENSIVE METABOLIC PANEL
ALT: 19 U/L (ref 0–35)
AST: 19 U/L (ref 0–37)
Albumin: 4 g/dL (ref 3.5–5.2)
Alkaline Phosphatase: 58 U/L (ref 39–117)
BUN: 13 mg/dL (ref 6–23)
CO2: 30 mEq/L (ref 19–32)
Calcium: 9.3 mg/dL (ref 8.4–10.5)
Chloride: 102 mEq/L (ref 96–112)
Creatinine, Ser: 0.82 mg/dL (ref 0.40–1.20)
GFR: 76.91 mL/min (ref >60.00)
Glucose, Bld: 100 mg/dL — ABNORMAL HIGH (ref 70–99)
Potassium: 4.7 mEq/L (ref 3.5–5.1)
Sodium: 138 mEq/L (ref 135–145)
Total Bilirubin: 0.4 mg/dL (ref 0.2–1.2)
Total Protein: 6.5 g/dL (ref 6.0–8.3)

## 2020-09-01 LAB — LIPID PANEL
Cholesterol: 187 mg/dL (ref 0–200)
HDL: 56.2 mg/dL (ref >39.00)
LDL Cholesterol: 112 mg/dL — ABNORMAL HIGH (ref 0–99)
NonHDL: 131.1
Total CHOL/HDL Ratio: 3
Triglycerides: 96 mg/dL (ref 0.0–149.0)
VLDL: 19.2 mg/dL (ref 0.0–40.0)

## 2020-09-01 LAB — TSH: TSH: 1.8 u[IU]/mL (ref 0.35–4.50)

## 2020-09-01 LAB — VITAMIN B12: Vitamin B-12: 298 pg/mL (ref 211–911)

## 2020-09-02 ENCOUNTER — Telehealth: Payer: Self-pay | Admitting: Family Medicine

## 2020-09-02 ENCOUNTER — Encounter: Payer: Self-pay | Admitting: Family Medicine

## 2020-09-02 NOTE — Telephone Encounter (Signed)
See result note.  

## 2020-09-02 NOTE — Telephone Encounter (Signed)
Referral placed.

## 2020-09-02 NOTE — Telephone Encounter (Signed)
Patient would like chest x ray results

## 2020-09-02 NOTE — Telephone Encounter (Signed)
Pt would like her a x-ray reviewed

## 2020-09-02 NOTE — Telephone Encounter (Signed)
done

## 2020-09-03 ENCOUNTER — Encounter: Payer: Self-pay | Admitting: Family Medicine

## 2020-09-06 ENCOUNTER — Other Ambulatory Visit (HOSPITAL_BASED_OUTPATIENT_CLINIC_OR_DEPARTMENT_OTHER): Payer: Self-pay

## 2020-09-06 ENCOUNTER — Other Ambulatory Visit: Payer: Self-pay | Admitting: *Deleted

## 2020-09-06 DIAGNOSIS — F17200 Nicotine dependence, unspecified, uncomplicated: Secondary | ICD-10-CM

## 2020-09-06 NOTE — Addendum Note (Signed)
Addended by: Thelma Barge D on: 09/06/2020 04:53 PM   Modules accepted: Orders

## 2020-09-07 ENCOUNTER — Telehealth: Payer: Self-pay

## 2020-09-07 ENCOUNTER — Encounter: Payer: Self-pay | Admitting: Family Medicine

## 2020-09-07 NOTE — Telephone Encounter (Signed)
Pt scheduled with Ramon Dredge tomorrow

## 2020-09-07 NOTE — Telephone Encounter (Signed)
Spoke with pt and asked if she could get an appointment with someone to explain to her what the x-ray says and to visual to x-ray also. Pt is scheduled with Ramon Dredge I made him aware of her request.

## 2020-09-08 ENCOUNTER — Encounter: Payer: Self-pay | Admitting: Family Medicine

## 2020-09-08 ENCOUNTER — Other Ambulatory Visit (HOSPITAL_BASED_OUTPATIENT_CLINIC_OR_DEPARTMENT_OTHER): Payer: Self-pay

## 2020-09-08 ENCOUNTER — Other Ambulatory Visit: Payer: Self-pay

## 2020-09-08 ENCOUNTER — Ambulatory Visit: Payer: Managed Care, Other (non HMO) | Admitting: Medical

## 2020-09-08 VITALS — BP 136/79 | HR 66 | Temp 98.5°F | Resp 20 | Ht 67.0 in | Wt 202.4 lb

## 2020-09-08 DIAGNOSIS — R9389 Abnormal findings on diagnostic imaging of other specified body structures: Secondary | ICD-10-CM

## 2020-09-08 DIAGNOSIS — F17200 Nicotine dependence, unspecified, uncomplicated: Secondary | ICD-10-CM | POA: Diagnosis not present

## 2020-09-08 MED ORDER — NICOTINE 14 MG/24HR TD PT24
14.0000 mg | MEDICATED_PATCH | Freq: Every day | TRANSDERMAL | 0 refills | Status: DC
  Filled 2020-09-08: qty 28, 28d supply, fill #0

## 2020-09-08 NOTE — Patient Instructions (Signed)
History of smoking with recent chest xray and prior xrays indicating hyperinflation. Attend pulmonologist appt they may get PFT studies and may advise going ahead and getting ct screening chest.  If you are wheezing recommend using your inhalers qvar and albuterol. Pulmonologist might offer different inhaler such as symbicort or other.   Start antibiotic Dr. Abner Greenspan sent you in.  For smoking sent in nicotine patches. Recommend start tapering off your current 6 cigarettes a day.  Follow up as regularly scheduled with Dr. Abner Greenspan or as needed

## 2020-09-08 NOTE — Progress Notes (Signed)
Subjective:    Patient ID: Andrea Bowman, female    DOB: Aug 31, 1960, 60 y.o.   MRN: [id number]  HPI  Pt in for follow up on xray. She has questions. Reason for xray was due to some recent cough, hx of bronchitis and she also had covid in the past.Pt states cough is very rarea and seldom now. When xray was done she was spitting up some mild colored mucus. Pt pcp sent in antibiotic.  Pt has history of smoking for 30 years. 1/2 pack a day. Pt states hesitant to do ct of chest due to radiation. She has appointment with pulmonologist on September 30, 2020.     FINDINGS: Cardiomediastinal silhouette unchanged in size and contour. No evidence of central vascular congestion. No interlobular septal thickening.  Stigmata of emphysema, with increased retrosternal airspace, flattened hemidiaphragms, increased AP diameter, and hyperinflation on the AP view.  No pneumothorax or pleural effusion. Coarsened interstitial markings, with no confluent airspace disease.  No acute displaced fracture. Degenerative changes of the spine.  IMPRESSION: Chronic lung changes and emphysema without definite evidence of acute cardiopulmonary disease. Note that low-dose CT lung cancer screening is recommended for patients who are 20-67 years of age with a 30+ pack-year history of smoking, and who are currently smoking or quit <=15 years ago.   Failed chantix in the past. Never used patches before except when in hospital.   Waymon Amato will feel wheezy and shortness of breath.  Pt walked down hall and 02 sat % 97 entire walk except at end was 96%.   Occasional wheezing. She has both qvar and albuterol inhaler to use if needed.   Review of Systems  Constitutional: Negative for chills, diaphoresis and fatigue.  Respiratory: Negative for cough, chest tightness, wheezing and stridor.   Cardiovascular: Negative for chest pain and palpitations.  Genitourinary: Negative for dyspareunia and dysuria.   Musculoskeletal: Negative for back pain.  Skin: Negative for rash.  Neurological: Negative for dizziness, seizures, syncope, weakness and light-headedness.  Hematological: Negative for adenopathy.  Psychiatric/Behavioral: Negative for behavioral problems, decreased concentration, dysphoric mood and sleep disturbance. The patient is not nervous/anxious.     Past Medical History:  Diagnosis Date  . Acute bronchitis 08/24/2010  . Anxiety    panic attacks  . 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 Complication of anesthesia    hard to wake up post-op  . Dental bridge present    upper  . Dental infection    will finish antibiotic 05/01/2012  . Depression   . Insomnia 11/14/2014  . Osteoarthritis of knee    right  . Perimenopausal   . PVC (premature ventricular contraction)    takes Atenolol   . TOBACCO ABUSE 11/30/2009   Qualifier: Diagnosis of  By: Jens Som, MD, Lyn Hollingshead   . 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 Marital status: Married    Spouse name: Not on file  . Number of children: 1  . Years of education: Not on file  . Highest education level: Not on file  Occupational History    Comment: home  Tobacco Use  . Smoking status: Current Every Day Smoker    Years: 30.00    Types: Cigarettes  . Smokeless tobacco: Never Used  . Tobacco comment: 3 cig./day and several e-cigarettes/day  Substance and Sexual Activity  . Alcohol use: Yes    Comment: seldom  . Drug use:  No  . Sexual activity: Yes    Partners: Male    Comment: is separted from husband, he is out of the country and seeing someone else  Other Topics Concern  . Not on file  Social History Narrative   Relocated from [place]. Lives with husband. Home maker.   Social Determinants of Health   Financial Resource Strain: Not on file  Food Insecurity: Not on file  Transportation Needs: Not on file  Physical  Activity: Not on file  Stress: Not on file  Social Connections: Not on file  Intimate Partner Violence: Not on file    Past Surgical History:  Procedure Laterality Date  . CHOLECYSTECTOMY  2007  . KNEE ARTHROSCOPY WITH LATERAL RELEASE  05/02/2012   Procedure: KNEE ARTHROSCOPY WITH LATERAL RELEASE;  Surgeon: Harvie Junior, MD;  Location: [facility];  Service: Orthopedics;  Laterality: Right;  chondroplasty with lateral release    Family History  Problem Relation Age of Onset  . Cancer Mother 6       Breast  . Breast cancer Mother 59  . Hypertension Father   . Heart failure Father        CHF  . Depression Sister        previous history of  . Anxiety disorder Sister        Previous history of  . Cancer Sister        cancer, breast  . Breast cancer Sister 68  . Depression Brother   . Anxiety disorder Brother   . Ulcers Brother        PUD  . Cancer Brother        skin  . Cancer Maternal Aunt        X several w/ breast cancer, various ages  . Breast cancer Maternal Aunt   . Cancer Paternal Uncle        X several w/ prostate cancer, 1 w/colon cancer  . Cancer Maternal Aunt        uterine  . Breast cancer Maternal Aunt   . Cancer Maternal Aunt        breast  . Breast cancer Maternal Aunt     Allergies  Allergen Reactions  . Cefdinir Hives  . Doxycycline Diarrhea  . Prednisone Other (See Comments)    REACTION: REDNESS OF FACE    Current Outpatient Medications on File Prior to Visit  Medication Sig Dispense Refill  . acyclovir cream (ZOVIRAX) 5 % Apply 1 application topically every 3 (three) hours. 5 g 2  . albuterol (VENTOLIN HFA) 108 (90 Base) MCG/ACT inhaler Inhale 2 puffs into the lungs every 6 (six) hours as needed for wheezing or shortness of breath. 1 Inhaler 0  . atenolol (TENORMIN) 25 MG tablet TAKE 1 TABLET BY MOUTH ONCE DAILY 90 tablet 1  . beclomethasone (QVAR) 40 MCG/ACT inhaler INHALE 2 PUFFS INTO THE LUNGS 2 (TWO) TIMES DAILY. 10.6 g 1   . cholecalciferol (VITAMIN D) 1000 units tablet Take 1,000 Units by mouth 2 (two) times daily.    . clonazePAM (KLONOPIN) 0.5 MG tablet TAKE 1 TABLET BY MOUTH THREE TIMES A DAY AS NEEDED FOR ANXIETY 60 tablet 2  . Esomeprazole Magnesium (NEXIUM PO) Take by mouth.    Marland Kitchen ibuprofen (ADVIL,MOTRIN) 200 MG tablet Take 600 mg by mouth every 6 (six) hours as needed for pain.    Marland Kitchen ipratropium (ATROVENT HFA) 17 MCG/ACT inhaler Inhale 2 puffs into the lungs every 6 (six) hours as needed  for wheezing. 12.9 g 5  . phenazopyridine (PYRIDIUM) 200 MG tablet Take 1 tablet (200 mg total) by mouth 3 (three) times daily as needed for pain. 6 tablet 0  . QVAR REDIHALER 80 MCG/ACT inhaler TAKE 1 PUFF BY MOUTH TWICE A DAY 10.6 g 1  . tiZANidine (ZANAFLEX) 4 MG tablet Take 0.5-1 tablets (2-4 mg total) by mouth every 8 (eight) hours as needed for muscle spasms. 90 tablet 1  . venlafaxine XR (EFFEXOR-XR) 37.5 MG 24 hr capsule TAKE 1 CAPSULE (37.5 MG TOTAL) BY MOUTH DAILY WITH BREAKFAST. 7 capsule 0  . venlafaxine XR (EFFEXOR-XR) 75 MG 24 hr capsule TAKE 1 CAPSULE (75 MG TOTAL) BY MOUTH DAILY WITH BREAKFAST. *START AFTER 1 WEEK ON 37.5MG  DOSE* 30 capsule 2  . vitamin B-12 (CYANOCOBALAMIN) 1000 MCG tablet Take 1,000 mcg by mouth daily.     No current facility-administered medications on file prior to visit.    BP 136/79   Pulse 66   Temp 98.5 F (36.9 C)   Resp 20   Ht 5\' 7"  (1.702 m)   Wt 202 lb 6.4 oz (91.8 kg)   SpO2 98%   BMI 31.70 kg/m       Objective:   Physical Exam  General Mental Status- Alert. General Appearance- Not in acute distress.   Skin General: Color- Normal Color. Moisture- Normal Moisture.  Neck Carotid Arteries- Normal color. Moisture- Normal Moisture. No carotid bruits. No JVD.  Chest and Lung Exam Auscultation: Breath Sounds:-Normal.  Cardiovascular Auscultation:Rythm- Regular. Murmurs & Other Heart Sounds:Auscultation of the heart reveals- No  Murmurs.  Abdomen  Inspection:-Inspeection Normal. Palpation/Percussion:Note:No mass. Palpation and Percussion of the abdomen reveal- Non Tender, Non Distended + BS, no rebound or guarding.  Neurologic Cranial Nerve exam:- CN III-XII intact(No nystagmus), symmetric smile. Strength:- 5/5 equal and symmetric strength both upper and lower extremities.      Assessment & Plan:  History of smoking with recent chest xray and prior xrays indicating hyperinflation. Attend pulmonologist appt they may get PFT studies and may advise going ahead and getting ct screening chest.  If you are wheezing recommend using your inhalers qvar and albuterol. Pulmonologist might offer different inhaler such as symbicort or other.   Start antibiotic Dr. sent you in.  For smoking sent in nicotine patches. Recommend start tapering off your current 6 cigarettes a day.  Follow up as regularly scheduled with Dr. Abner Greenspan or as needed  Abner Greenspan, PA-C   Time spent with patient today was 31 minutes which consisted of chart review, discussing diagnosis, work up, treatment and documentation.

## 2020-09-09 ENCOUNTER — Other Ambulatory Visit: Payer: Self-pay | Admitting: Family Medicine

## 2020-09-09 ENCOUNTER — Other Ambulatory Visit (HOSPITAL_BASED_OUTPATIENT_CLINIC_OR_DEPARTMENT_OTHER): Payer: Self-pay

## 2020-09-09 MED ORDER — SULFAMETHOXAZOLE-TRIMETHOPRIM 800-160 MG PO TABS
1.0000 | ORAL_TABLET | Freq: Two times a day (BID) | ORAL | 0 refills | Status: DC
  Filled 2020-09-09: qty 20, 10d supply, fill #0

## 2020-09-09 NOTE — Telephone Encounter (Signed)
Pt aware of medication being sent back in

## 2020-09-16 ENCOUNTER — Other Ambulatory Visit (HOSPITAL_BASED_OUTPATIENT_CLINIC_OR_DEPARTMENT_OTHER): Payer: Self-pay

## 2020-09-30 ENCOUNTER — Institutional Professional Consult (permissible substitution): Payer: Managed Care, Other (non HMO) | Admitting: Pulmonary Disease

## 2020-10-06 ENCOUNTER — Other Ambulatory Visit: Payer: Self-pay | Admitting: Family Medicine

## 2020-10-06 ENCOUNTER — Other Ambulatory Visit (HOSPITAL_BASED_OUTPATIENT_CLINIC_OR_DEPARTMENT_OTHER): Payer: Self-pay

## 2020-10-06 MED ORDER — ATENOLOL 25 MG PO TABS
25.0000 mg | ORAL_TABLET | Freq: Every day | ORAL | 1 refills | Status: DC
  Filled 2020-10-06: qty 90, 90d supply, fill #0
  Filled 2020-12-19: qty 90, 90d supply, fill #1

## 2020-10-06 MED FILL — Clonazepam Tab 0.5 MG: ORAL | 20 days supply | Qty: 60 | Fill #1 | Status: AC

## 2020-12-14 ENCOUNTER — Other Ambulatory Visit (HOSPITAL_BASED_OUTPATIENT_CLINIC_OR_DEPARTMENT_OTHER): Payer: Self-pay

## 2020-12-14 ENCOUNTER — Other Ambulatory Visit: Payer: Self-pay | Admitting: Family Medicine

## 2020-12-14 DIAGNOSIS — F341 Dysthymic disorder: Secondary | ICD-10-CM

## 2020-12-14 MED ORDER — CLONAZEPAM 0.5 MG PO TABS
ORAL_TABLET | ORAL | 2 refills | Status: DC
  Filled 2020-12-14: qty 60, 20d supply, fill #0
  Filled 2021-02-09: qty 60, 20d supply, fill #1
  Filled 2021-04-08: qty 60, 20d supply, fill #2

## 2020-12-14 NOTE — Telephone Encounter (Signed)
Requesting:klonopin 0.5mg  Contract:unknown WUX:[id number] Last Visit:08/30/20 Next Visit:12/27/20 Last Refill:06/07/20  Please Advise

## 2020-12-19 ENCOUNTER — Other Ambulatory Visit (HOSPITAL_BASED_OUTPATIENT_CLINIC_OR_DEPARTMENT_OTHER): Payer: Self-pay

## 2020-12-27 ENCOUNTER — Ambulatory Visit: Payer: Managed Care, Other (non HMO) | Admitting: Family Medicine

## 2021-02-09 ENCOUNTER — Telehealth: Payer: Self-pay | Admitting: Family Medicine

## 2021-02-09 ENCOUNTER — Other Ambulatory Visit (HOSPITAL_BASED_OUTPATIENT_CLINIC_OR_DEPARTMENT_OTHER): Payer: Self-pay

## 2021-02-09 NOTE — Telephone Encounter (Signed)
Spoke to pt made her aware that we can not send medication across state lines anymore.

## 2021-02-09 NOTE — Telephone Encounter (Signed)
Called pt lvm to call back.

## 2021-02-09 NOTE — Telephone Encounter (Signed)
Pt is currently in [place] and needs the following medication refilled.  Medication: Rx #: [id number]  clonazePAM (KLONOPIN) 0.5 MG tablet   Has the patient contacted their pharmacy? Yes.   (If no, request that the patient contact the pharmacy for the refill.) (If yes, when and what did the pharmacy advise?)  Preferred Pharmacy (with phone number or street name):  [facility] [address] [place], [place] [address] [phone]  Agent: Please be advised that RX refills may take up to 3 business days. We ask that you follow-up with your pharmacy.

## 2021-03-16 ENCOUNTER — Encounter: Payer: Self-pay | Admitting: Family Medicine

## 2021-03-17 MED ORDER — ALBUTEROL SULFATE HFA 108 (90 BASE) MCG/ACT IN AERS: 2.0000 | INHALATION_SPRAY | Freq: Four times a day (QID) | RESPIRATORY_TRACT | 5 refills | Status: AC | PRN

## 2021-03-23 ENCOUNTER — Encounter: Payer: Self-pay | Admitting: Family Medicine

## 2021-03-24 ENCOUNTER — Other Ambulatory Visit: Payer: Self-pay | Admitting: Family Medicine

## 2021-03-24 MED ORDER — ALBUTEROL SULFATE (2.5 MG/3ML) 0.083% IN NEBU: 2.5000 mg | INHALATION_SOLUTION | Freq: Four times a day (QID) | RESPIRATORY_TRACT | 1 refills | Status: AC | PRN

## 2021-04-08 ENCOUNTER — Other Ambulatory Visit: Payer: Self-pay | Admitting: Family Medicine

## 2021-04-08 ENCOUNTER — Encounter: Payer: Self-pay | Admitting: Family Medicine

## 2021-04-10 ENCOUNTER — Other Ambulatory Visit (HOSPITAL_BASED_OUTPATIENT_CLINIC_OR_DEPARTMENT_OTHER): Payer: Self-pay

## 2021-04-10 MED ORDER — ATENOLOL 25 MG PO TABS: 25.0000 mg | ORAL_TABLET | Freq: Every day | ORAL | 0 refills | Status: DC

## 2021-04-11 ENCOUNTER — Other Ambulatory Visit: Payer: Self-pay | Admitting: Family Medicine

## 2021-04-11 ENCOUNTER — Other Ambulatory Visit (HOSPITAL_BASED_OUTPATIENT_CLINIC_OR_DEPARTMENT_OTHER): Payer: Self-pay

## 2021-04-11 DIAGNOSIS — F419 Anxiety disorder, unspecified: Secondary | ICD-10-CM

## 2021-04-11 DIAGNOSIS — F341 Dysthymic disorder: Secondary | ICD-10-CM

## 2021-04-11 DIAGNOSIS — F32A Depression, unspecified: Secondary | ICD-10-CM

## 2021-04-11 MED ORDER — CLONAZEPAM 0.5 MG PO TABS
ORAL_TABLET | ORAL | 2 refills | Status: DC
  Filled 2021-04-12: qty 60, 20d supply, fill #0
  Filled 2021-06-06: qty 60, 20d supply, fill #1
  Filled 2021-08-05: qty 60, 20d supply, fill #2

## 2021-04-11 MED ORDER — ESCITALOPRAM OXALATE 20 MG PO TABS
40.0000 mg | ORAL_TABLET | Freq: Every day | ORAL | 1 refills | Status: DC
  Filled 2021-04-11: qty 180, 90d supply, fill #0
  Filled 2021-10-01: qty 180, 90d supply, fill #1

## 2021-04-11 NOTE — Telephone Encounter (Signed)
Requesting: clonazepam Contract: none UDS: none Last Visit: 08/30/20 Next Visit: none Last Refill: 12/14/20  Please Advise

## 2021-04-12 ENCOUNTER — Other Ambulatory Visit (HOSPITAL_BASED_OUTPATIENT_CLINIC_OR_DEPARTMENT_OTHER): Payer: Self-pay

## 2021-04-14 ENCOUNTER — Other Ambulatory Visit (HOSPITAL_BASED_OUTPATIENT_CLINIC_OR_DEPARTMENT_OTHER): Payer: Self-pay

## 2021-04-14 ENCOUNTER — Emergency Department (HOSPITAL_BASED_OUTPATIENT_CLINIC_OR_DEPARTMENT_OTHER)
Admission: EM | Admit: 2021-04-14 | Discharge: 2021-04-14 | Disposition: A | Discharge status: Home / Self Care | Payer: Managed Care, Other (non HMO) | Attending: Emergency Medicine | Admitting: Emergency Medicine

## 2021-04-14 ENCOUNTER — Emergency Department (HOSPITAL_BASED_OUTPATIENT_CLINIC_OR_DEPARTMENT_OTHER): Payer: Managed Care, Other (non HMO)

## 2021-04-14 ENCOUNTER — Other Ambulatory Visit: Payer: Self-pay

## 2021-04-14 ENCOUNTER — Encounter: Payer: Self-pay | Admitting: Family Medicine

## 2021-04-14 ENCOUNTER — Encounter (HOSPITAL_BASED_OUTPATIENT_CLINIC_OR_DEPARTMENT_OTHER): Payer: Self-pay

## 2021-04-14 DIAGNOSIS — Z20822 Contact with and (suspected) exposure to covid-19: Secondary | ICD-10-CM | POA: Insufficient documentation

## 2021-04-14 DIAGNOSIS — J209 Acute bronchitis, unspecified: Secondary | ICD-10-CM | POA: Diagnosis not present

## 2021-04-14 DIAGNOSIS — R0602 Shortness of breath: Secondary | ICD-10-CM | POA: Diagnosis present

## 2021-04-14 LAB — BRAIN NATRIURETIC PEPTIDE: B Natriuretic Peptide: 11.4 pg/mL (ref 0.0–100.0)

## 2021-04-14 LAB — BASIC METABOLIC PANEL
Anion gap: 8 (ref 5–15)
BUN: 9 mg/dL (ref 8–23)
CO2: 23 mmol/L (ref 22–32)
Calcium: 8.8 mg/dL — ABNORMAL LOW (ref 8.9–10.3)
Chloride: 104 mmol/L (ref 98–111)
Creatinine, Ser: 0.73 mg/dL (ref 0.44–1.00)
GFR, Estimated: >60 mL/min (ref >60)
Glucose, Bld: 107 mg/dL — ABNORMAL HIGH (ref 70–99)
Potassium: 4.2 mmol/L (ref 3.5–5.1)
Sodium: 135 mmol/L (ref 135–145)

## 2021-04-14 LAB — CBC WITH DIFFERENTIAL/PLATELET
Abs Immature Granulocytes: 0.03 10*3/uL (ref 0.00–0.07)
Basophils Absolute: 0 10*3/uL (ref 0.0–0.1)
Basophils Relative: 0 %
Eosinophils Absolute: 0.7 10*3/uL — ABNORMAL HIGH (ref 0.0–0.5)
Eosinophils Relative: 9 %
HCT: 39.1 % (ref 36.0–46.0)
Hemoglobin: 13.6 g/dL (ref 12.0–15.0)
Immature Granulocytes: 0 %
Lymphocytes Relative: 30 %
Lymphs Abs: 2.6 10*3/uL (ref 0.7–4.0)
MCH: 33.3 pg (ref 26.0–34.0)
MCHC: 34.8 g/dL (ref 30.0–36.0)
MCV: 95.8 fL (ref 80.0–100.0)
Monocytes Absolute: 0.8 10*3/uL (ref 0.1–1.0)
Monocytes Relative: 9 %
Neutro Abs: 4.3 10*3/uL (ref 1.7–7.7)
Neutrophils Relative %: 52 %
Platelets: 248 10*3/uL (ref 150–400)
RBC: 4.08 MIL/uL (ref 3.87–5.11)
RDW: 13.3 % (ref 11.5–15.5)
WBC: 8.4 10*3/uL (ref 4.0–10.5)
nRBC: 0 % (ref 0.0–0.2)

## 2021-04-14 LAB — RESP PANEL BY RT-PCR (FLU A&B, COVID) ARPGX2
Influenza A by PCR: NEGATIVE
Influenza B by PCR: NEGATIVE
SARS Coronavirus 2 by RT PCR: NEGATIVE

## 2021-04-14 MED ORDER — PREDNISONE 20 MG PO TABS
ORAL_TABLET | ORAL | 0 refills | Status: DC
  Filled 2021-04-14: qty 8, 4d supply, fill #0

## 2021-04-14 MED ORDER — IPRATROPIUM-ALBUTEROL 0.5-2.5 (3) MG/3ML IN SOLN
3.0000 mL | Freq: Once | RESPIRATORY_TRACT | Status: AC
  Administered 2021-04-14: 3 mL via RESPIRATORY_TRACT
  Filled 2021-04-14: qty 3

## 2021-04-14 MED ORDER — ALBUTEROL SULFATE HFA 108 (90 BASE) MCG/ACT IN AERS
INHALATION_SPRAY | RESPIRATORY_TRACT | Status: AC
  Administered 2021-04-14: 2
  Filled 2021-04-14: qty 6.7

## 2021-04-14 MED ORDER — METHYLPREDNISOLONE SODIUM SUCC 125 MG IJ SOLR
125.0000 mg | Freq: Once | INTRAMUSCULAR | Status: AC
  Administered 2021-04-14: 125 mg via INTRAVENOUS
  Filled 2021-04-14: qty 2

## 2021-04-14 NOTE — Telephone Encounter (Signed)
Pt called in regarding shortness of breath, flu like symptoms, and stated her oxygen readings are anywhere between, 90-93. Transferred pt over to triage.

## 2021-04-14 NOTE — ED Triage Notes (Signed)
Pt arrives ambulatory to ED with reports of having the flu last week and then being diagnosed with bronchitis states that she was given 2 inhalers at home and has been using them. RT at bedside with O2 96% on room air. Pt reports productive clear cough.

## 2021-04-14 NOTE — Progress Notes (Signed)
Patient was ambulated on pulse ox by EMT. SAT 95%

## 2021-04-14 NOTE — ED Provider Notes (Signed)
[facility] DEPART[facility] Provider Note   CSN: [id number] Arrival date & time: 04/14/21  1025     History  Chief Complaint  Patient presents with   Shortness of Breath    Andrea Bowman is a 61 y.o. female.  Patient is a 61 year old female who presents with cough and shortness of breath.  She reports that she had the flu in mid December.  She did not test herself for it but had a nephew who did test positive for fluid and nephew was in her care for 7 days.  She had the same symptoms.  On December 23 she went to an urgent care and was diagnosed with bronchitis.  She had wheezing and coughing.  She was started on Zithromax and prednisone.  She has completed those medications.  She said she was better for a few days and then about 5 or 6 days ago started getting worse again with cough and congestion in her chest.  Her cough is productive of some clear light yellow sputum.  She has some wheezing and shortness of breath.  She has been using her inhalers and nebulizers at home without improvement in symptoms.  She has a pulse ox at home and says her sats have been running between 88 and 94%.  She gets short of breath with minimal exertion.  She occasionally has some right-sided chest pain that lasts about 5 to 10 minutes but then goes away.  She currently denies any chest pain.  She says she does usually get wheezing with bronchitis a couple times a year.  She is a smoker.      Home Medications Prior to Admission medications   Medication Sig Start Date End Date Taking? Authorizing Provider  predniSONE (DELTASONE) 20 MG tablet Take 2 tablets by mouth daily for 4 days 04/14/21  Yes Malvin Johns, MD  acyclovir cream (ZOVIRAX) 5 % Apply 1 application topically every 3 (three) hours. 06/12/18   Mosie Lukes, MD  albuterol (PROVENTIL) (2.5 MG/3ML) 0.083% nebulizer solution Take 3 mLs (2.5 mg total) by nebulization every 6 (six) hours as needed for wheezing or shortness of breath.  03/24/21   Mosie Lukes, MD  albuterol (VENTOLIN HFA) 108 (90 Base) MCG/ACT inhaler Inhale 2 puffs into the lungs every 6 (six) hours as needed for wheezing or shortness of breath. 03/17/21   Mosie Lukes, MD  atenolol (TENORMIN) 25 MG tablet Take 1 tablet (25 mg total) by mouth daily. 04/10/21   Mosie Lukes, MD  beclomethasone (QVAR) 40 MCG/ACT inhaler INHALE 2 PUFFS INTO THE LUNGS 2 (TWO) TIMES DAILY. 05/19/20 05/19/21  Copland, Gay Filler, MD  cholecalciferol (VITAMIN D) 1000 units tablet Take 1,000 Units by mouth 2 (two) times daily.    [provider]  clonazePAM (KLONOPIN) 0.5 MG tablet TAKE 1 TABLET BY MOUTH THREE TIMES A DAY AS NEEDED FOR ANXIETY 04/11/21 10/08/21  Mosie Lukes, MD  escitalopram (LEXAPRO) 20 MG tablet Take 2 tablets (40 mg total) by mouth daily. 04/11/21   Mosie Lukes, MD  Esomeprazole Magnesium (NEXIUM PO) Take by mouth.    [provider]  ibuprofen (ADVIL,MOTRIN) 200 MG tablet Take 600 mg by mouth every 6 (six) hours as needed for pain.    [provider]  ipratropium (ATROVENT HFA) 17 MCG/ACT inhaler Inhale 2 puffs into the lungs every 6 (six) hours as needed for wheezing. 06/12/18   Mosie Lukes, MD  nicotine (NICODERM CQ - DOSED IN  MG/24 HOURS) 14 mg/24hr patch Place 1 patch (14 mg total) onto the skin daily. 09/08/20   Saguier, Percell Miller, PA-C  phenazopyridine (PYRIDIUM) 200 MG tablet Take 1 tablet (200 mg total) by mouth 3 (three) times daily as needed for pain. 07/29/18   Ann Held, DO  QVAR REDIHALER 80 MCG/ACT inhaler TAKE 1 PUFF BY MOUTH TWICE A DAY 04/12/19   Mosie Lukes, MD  sulfamethoxazole-trimethoprim (BACTRIM DS) 800-160 MG tablet Take 1 tablet by mouth 2 (two) times daily. 09/09/20   Mosie Lukes, MD  tiZANidine (ZANAFLEX) 4 MG tablet Take 0.5-1 tablets (2-4 mg total) by mouth every 8 (eight) hours as needed for muscle spasms. 06/13/18   Mosie Lukes, MD  vitamin B-12 (CYANOCOBALAMIN) 1000 MCG tablet Take 1,000 mcg  by mouth daily.    [provider]      Allergies    Cefdinir, Doxycycline, and Prednisone    Review of Systems   Review of Systems  Constitutional:  Positive for fatigue. Negative for chills, diaphoresis and fever.  HENT:  Negative for congestion, rhinorrhea and sneezing.   Eyes: Negative.   Respiratory:  Positive for cough, shortness of breath and wheezing. Negative for chest tightness.   Cardiovascular:  Positive for chest pain. Negative for leg swelling.  Gastrointestinal:  Negative for abdominal pain, blood in stool, diarrhea, nausea and vomiting.  Genitourinary:  Negative for difficulty urinating, flank pain, frequency and hematuria.  Musculoskeletal:  Negative for arthralgias and back pain.  Skin:  Negative for rash.  Neurological:  Negative for dizziness, speech difficulty, weakness, numbness and headaches.   Physical Exam Updated Vital Signs BP (!) 157/117    Pulse 87    Temp 97.9 F (36.6 C) (Oral)    Resp (!) 21    Ht 5\' 7"  (1.702 m)    Wt 90.7 kg    SpO2 94%    BMI 31.32 kg/m  Physical Exam Constitutional:      Appearance: She is well-developed.  HENT:     Head: Normocephalic and atraumatic.  Eyes:     Pupils: Pupils are equal, round, and reactive to light.  Cardiovascular:     Rate and Rhythm: Normal rate and regular rhythm.     Heart sounds: Normal heart sounds.  Pulmonary:     Effort: Pulmonary effort is normal. No respiratory distress.     Breath sounds: Wheezing present. No rales.  Chest:     Chest wall: No tenderness.  Abdominal:     General: Bowel sounds are normal.     Palpations: Abdomen is soft.     Tenderness: There is no abdominal tenderness. There is no guarding or rebound.  Musculoskeletal:        General: Normal range of motion.     Cervical back: Normal range of motion and neck supple.     Comments: No edema or calf tenderness  Lymphadenopathy:     Cervical: No cervical adenopathy.  Skin:    General: Skin is warm and dry.      Findings: No rash.  Neurological:     Mental Status: She is alert and oriented to person, place, and time.    ED Results / Procedures / Treatments   Labs (all labs ordered are listed, but only abnormal results are displayed) Labs Reviewed  BASIC METABOLIC PANEL - Abnormal; Notable for the following components:      Result Value   Glucose, Bld 107 (*)    Calcium 8.8 (*)  All other components within normal limits  CBC WITH DIFFERENTIAL/PLATELET - Abnormal; Notable for the following components:   Eosinophils Absolute 0.7 (*)    All other components within normal limits  RESP PANEL BY RT-PCR (FLU A&B, COVID) ARPGX2  BRAIN NATRIURETIC PEPTIDE    EKG EKG Interpretation  Date/Time:  Friday April 14 2021 10:48:00 EST Ventricular Rate:  84 PR Interval:  150 QRS Duration: 104 QT Interval:  394 QTC Calculation: 466 R Axis:   77 Text Interpretation: Sinus rhythm Probable inferior infarct, old since last tracing no significant change Confirmed by Malvin Johns [phone]) on 04/14/2021 10:51:30 AM  Radiology DG Chest Port 1 View  Result Date: 04/14/2021 CLINICAL DATA:  Cough EXAM: PORTABLE CHEST 1 VIEW COMPARISON:  09/01/2020 FINDINGS: Cardiac size is within normal limits. There are no signs of pulmonary edema or new focal infiltrates. There is no pleural effusion or pneumothorax. Low position of diaphragms may suggest COPD. IMPRESSION: There are no new infiltrates or signs of pulmonary edema. Electronically Signed   By: Elmer Picker M.D.   On: 04/14/2021 11:21    Procedures Procedures    Medications Ordered in ED Medications  albuterol (VENTOLIN HFA) 108 (90 Base) MCG/ACT inhaler (2 puffs  Given 04/14/21 1039)  methylPREDNISolone sodium succinate (SOLU-MEDROL) 125 mg/2 mL injection 125 mg (125 mg Intravenous Given 04/14/21 1125)  ipratropium-albuterol (DUONEB) 0.5-2.5 (3) MG/3ML nebulizer solution 3 mL (3 mLs Nebulization Given 04/14/21 1125)    ED Course/ Medical Decision Making/  A&P                           Medical Decision Making  Patient is a 61 year old female who presents with wheezing and shortness of breath.  She had a viral URI recently.  She also recently completed a course of Zithromax and prednisone although she is not currently on steroids.  She got better for a short period time and then got worse.  She was wheezy on exam but not hypoxic.  She had nebulizer treatment and a dose of Solu-Medrol.  She is feeling better after this.  Her lungs sound better.  She has no increased work of breathing.  No fever.  No hypoxia.  No hypoxia with ambulation in the ED.  Her chest x-ray which was also reviewed by me shows no evidence of pneumonia.  Her EKG does not show any ischemic changes.  This was interpreted by me.  Her labs are nonconcerning.  Her COVID/flu test were negative.  There is no clinical suggestions of PE or ACS.  She was discharged home in good condition.  She was given a prescription for a 4-day course of prednisone.  Of note she lists an allergy to prednisone but she recently completed a course of prednisone without issues.  She was advised to continue using her nebulizer treatments at home.  She was advised to make a follow-up appoint with her PCP.  Return precautions were given.  Final Clinical Impression(s) / ED Diagnoses Final diagnoses:  Acute bronchitis with wheezing    Rx / DC Orders ED Discharge Orders          Ordered    predniSONE (DELTASONE) 20 MG tablet        04/14/21 1443              Malvin Johns, MD 04/14/21 1446

## 2021-04-14 NOTE — Telephone Encounter (Signed)
Nurse Assessment Nurse: Thana Farr, RN, Ladona Ridgel Date/Time Lamount Cohen Time): 04/14/2021 9:40:42 AM Confirm and document reason for call. If symptomatic, describe symptoms. ---Cough and congestion X 2 weeks. SOB noted at this time. Drinking and urinating normally. Denies fever, chest pain, or any other symptoms at this time. Does the patient have any new or worsening symptoms? ---Yes Will a triage be completed? ---Yes Related visit to physician within the last 2 weeks? ---Yes Does the PT have any chronic conditions? (i.e. diabetes, asthma, this includes High risk factors for pregnancy, etc.) ---No Is this a behavioral health or substance abuse call? ---No Guidelines Guideline Title Affirmed Question Affirmed Notes Nurse Date/Time (Eastern Time) Cough - Acute Productive [1] MODERATE difficulty breathing (e.g., speaks in phrases, SOB even at rest, pulse 100-120) AND [2] still present when not coughing [facility] 04/14/2021 9:43:58 AM PLEASE NOTE: All timestamps contained within this report are represented as [place] Standard Time. CONFIDENTIALTY NOTICE: This fax transmission is intended only for the addressee. It contains information that is legally privileged, confidential or otherwise protected from use or disclosure. If you are not the intended recipient, you are strictly prohibited from reviewing, disclosing, copying using or disseminating any of this information or taking any action in reliance on or regarding this information. If you have received this fax in error, please notify us immediately by telephone so that we can arrange for its return to [place]. Phone: [phone], Toll-Free: [phone], Fax: [phone] Page: 2 of 2 Call Id: [id number] Disp. Time Lamount Cohen Time) Disposition Final User 04/14/2021 9:39:05 AM Send to Urgent T[facility] Child 04/14/2021 9:46:51 AM Go to ED Now Yes Thana Farr, RN, Ursula Beath Disagree/Comply Comply Caller Understands Yes PreDisposition  InappropriateToAsk Care Advice Given Per Guideline * Leave now. Drive carefully. * Go to the ED at _________[facility] Hospital. * You need to be seen in the Emergency Department. GO TO ED NOW: * It is better and safer if another adult drives instead of you. ANOTHER ADULT SHOULD DRIVE: Comments User: Meade Maw, RN Date/Time (Eastern Time): 04/14/2021 9:43:26 AM Current SPO2 93% Referrals [facility] - ED

## 2021-04-14 NOTE — ED Notes (Signed)
Assumed care from Sharon. Patient laying quietly on gurney. No acute distress noted. Patient updated on plan of care. Will continue to monitor.   1153: Breathing tx completed. Patient states she is breathing much easier at this time. Awaiting results. Patient updated on plan of care.

## 2021-04-19 ENCOUNTER — Ambulatory Visit: Payer: Managed Care, Other (non HMO) | Admitting: Medical

## 2021-06-06 ENCOUNTER — Other Ambulatory Visit (HOSPITAL_BASED_OUTPATIENT_CLINIC_OR_DEPARTMENT_OTHER): Payer: Self-pay

## 2021-07-10 ENCOUNTER — Other Ambulatory Visit: Payer: Self-pay | Admitting: Family Medicine

## 2021-08-07 ENCOUNTER — Other Ambulatory Visit (HOSPITAL_BASED_OUTPATIENT_CLINIC_OR_DEPARTMENT_OTHER): Payer: Self-pay

## 2021-08-15 ENCOUNTER — Encounter: Payer: Self-pay | Admitting: Family Medicine

## 2021-08-15 ENCOUNTER — Telehealth: Payer: Self-pay

## 2021-08-15 ENCOUNTER — Ambulatory Visit: Payer: Managed Care, Other (non HMO) | Admitting: Family Medicine

## 2021-08-15 ENCOUNTER — Other Ambulatory Visit (HOSPITAL_BASED_OUTPATIENT_CLINIC_OR_DEPARTMENT_OTHER): Payer: Self-pay

## 2021-08-15 VITALS — BP 128/78 | HR 61 | Resp 20 | Ht 67.0 in | Wt 199.2 lb

## 2021-08-15 DIAGNOSIS — Z1239 Encounter for other screening for malignant neoplasm of breast: Secondary | ICD-10-CM

## 2021-08-15 DIAGNOSIS — I10 Essential (primary) hypertension: Secondary | ICD-10-CM

## 2021-08-15 DIAGNOSIS — R739 Hyperglycemia, unspecified: Secondary | ICD-10-CM | POA: Diagnosis not present

## 2021-08-15 DIAGNOSIS — E669 Obesity, unspecified: Secondary | ICD-10-CM

## 2021-08-15 DIAGNOSIS — F17200 Nicotine dependence, unspecified, uncomplicated: Secondary | ICD-10-CM

## 2021-08-15 DIAGNOSIS — R0683 Snoring: Secondary | ICD-10-CM

## 2021-08-15 DIAGNOSIS — E785 Hyperlipidemia, unspecified: Secondary | ICD-10-CM | POA: Diagnosis not present

## 2021-08-15 DIAGNOSIS — E538 Deficiency of other specified B group vitamins: Secondary | ICD-10-CM | POA: Diagnosis not present

## 2021-08-15 DIAGNOSIS — Z79899 Other long term (current) drug therapy: Secondary | ICD-10-CM

## 2021-08-15 DIAGNOSIS — R5383 Other fatigue: Secondary | ICD-10-CM

## 2021-08-15 DIAGNOSIS — G479 Sleep disorder, unspecified: Secondary | ICD-10-CM

## 2021-08-15 DIAGNOSIS — E559 Vitamin D deficiency, unspecified: Secondary | ICD-10-CM

## 2021-08-15 LAB — COMPREHENSIVE METABOLIC PANEL
ALT: 13 U/L (ref 0–35)
AST: 15 U/L (ref 0–37)
Albumin: 4.1 g/dL (ref 3.5–5.2)
Alkaline Phosphatase: 56 U/L (ref 39–117)
BUN: 14 mg/dL (ref 6–23)
CO2: 29 mEq/L (ref 19–32)
Calcium: 9.1 mg/dL (ref 8.4–10.5)
Chloride: 104 mEq/L (ref 96–112)
Creatinine, Ser: 0.82 mg/dL (ref 0.40–1.20)
GFR: 76.4 mL/min (ref >60.00)
Glucose, Bld: 93 mg/dL (ref 70–99)
Potassium: 4.4 mEq/L (ref 3.5–5.1)
Sodium: 139 mEq/L (ref 135–145)
Total Bilirubin: 0.4 mg/dL (ref 0.2–1.2)
Total Protein: 6.6 g/dL (ref 6.0–8.3)

## 2021-08-15 LAB — CBC
HCT: 44.3 % (ref 36.0–46.0)
Hemoglobin: 14.9 g/dL (ref 12.0–15.0)
MCHC: 33.5 g/dL (ref 30.0–36.0)
MCV: 97.1 fl (ref 78.0–100.0)
Platelets: 232 10*3/uL (ref 150.0–400.0)
RBC: 4.57 Mil/uL (ref 3.87–5.11)
RDW: 13 % (ref 11.5–15.5)
WBC: 7.6 10*3/uL (ref 4.0–10.5)

## 2021-08-15 LAB — VITAMIN D 25 HYDROXY (VIT D DEFICIENCY, FRACTURES): VITD: 29.88 ng/mL — ABNORMAL LOW (ref 30.00–100.00)

## 2021-08-15 LAB — LIPID PANEL
Cholesterol: 205 mg/dL — ABNORMAL HIGH (ref 0–200)
HDL: 58.1 mg/dL (ref >39.00)
LDL Cholesterol: 124 mg/dL — ABNORMAL HIGH (ref 0–99)
NonHDL: 146.6
Total CHOL/HDL Ratio: 4
Triglycerides: 114 mg/dL (ref 0.0–149.0)
VLDL: 22.8 mg/dL (ref 0.0–40.0)

## 2021-08-15 LAB — TSH: TSH: 1.35 u[IU]/mL (ref 0.35–5.50)

## 2021-08-15 LAB — HEMOGLOBIN A1C: Hgb A1c MFr Bld: 6.3 % (ref 4.6–6.5)

## 2021-08-15 LAB — VITAMIN B12: Vitamin B-12: 277 pg/mL (ref 211–911)

## 2021-08-15 MED ORDER — WEGOVY 0.25 MG/0.5ML ~~LOC~~ SOAJ
0.2500 mg | SUBCUTANEOUS | 0 refills | Status: AC
Start: 2021-08-15 — End: ?
  Filled 2021-08-15 (×2): qty 2, 28d supply, fill #0

## 2021-08-15 NOTE — Assessment & Plan Note (Signed)
hgba1c acceptable, minimize simple carbs. Increase exercise as tolerated. Continue current meds 

## 2021-08-15 NOTE — Patient Instructions (Addendum)
Encouraged increased hydration and fiber in diet. Daily probiotics. If bowels not moving can use MOM 2 tbls po in 4 oz of warm prune juice by mouth every 2-3 days. If no results then repeat in 4 hours with  Dulcolax suppository pr, may repeat again in 4 more hours as needed. Seek care if symptoms worsen. Consider daily Miralax and/or Dulcolax if symptoms persist.   ?Can do daily Miralax mixed with Benefiber ? ?Daily Multivitamin with minerals  ?Fatty acid such as Fish or Krill or Flaxseed oil caps daily ? ?Fatigue ?If you have fatigue, you feel tired all the time and have a lack of energy or a lack of motivation. Fatigue may make it difficult to start or complete tasks because of exhaustion. ?Occasional or mild fatigue is often a normal response to activity or life. However, long-term (chronic) or extreme fatigue may be a symptom of a medical condition such as: ?Depression. ?Not having enough red blood cells or hemoglobin in the blood (anemia). ?A problem with a small gland located in the lower front part of the neck (thyroid disorder). ?Rheumatologic conditions. These are problems related to the body's defense system (immune system). ?Infections, especially certain viral infections. ?Fatigue can also lead to negative health outcomes over time. ?Follow these instructions at home: ?Medicines ?Take over-the-counter and prescription medicines only as told by your health care provider. ?Take a multivitamin if told by your health care provider. ?Do not use herbal or dietary supplements unless they are approved by your health care provider. ?Eating and drinking ? ?Avoid heavy meals in the evening. ?Eat a well-balanced diet, which includes lean proteins, whole grains, plenty of fruits and vegetables, and low-fat dairy products. ?Avoid eating or drinking too many products with caffeine in them. ?Avoid alcohol. ?Drink enough fluid to keep your urine pale yellow. ?Activity ? ?Exercise regularly, as told by your health care  provider. ?Use or practice techniques to help you relax, such as yoga, tai chi, meditation, or massage therapy. ?Lifestyle ?Change situations that cause you stress. Try to keep your work and personal schedules in balance. ?Do not use recreational or illegal drugs. ?General instructions ?Monitor your fatigue for any changes. ?Go to bed and get up at the same time every day. ?Avoid fatigue by pacing yourself during the day and getting enough sleep at night. ?Maintain a healthy weight. ?Contact a health care provider if: ?Your fatigue does not get better. ?You have a fever. ?You suddenly lose or gain weight. ?You have headaches. ?You have trouble falling asleep or sleeping through the night. ?You feel angry, guilty, anxious, or sad. ?You have swelling in your legs or another part of your body. ?Get help right away if: ?You feel confused, feel like you might faint, or faint. ?Your vision is blurry or you have a severe headache. ?You have severe pain in your abdomen, your back, or the area between your waist and hips (pelvis). ?You have chest pain, shortness of breath, or an irregular or fast heartbeat. ?You are unable to urinate, or you urinate less than normal. ?You have abnormal bleeding from the rectum, nose, lungs, nipples, or, if you are female, the vagina. ?You vomit blood. ?You have thoughts about hurting yourself or others. ?These symptoms may be an emergency. Get help right away. Call 911. ?Do not wait to see if the symptoms will go away. ?Do not drive yourself to the hospital. ?Get help right away if you feel like you may hurt yourself or others, or have thoughts about  taking your own life. Go to your nearest emergency room or: ?Call 911. ?Call the [place] at [phone] or 988. This is open 24 hours a day. ?Text the Crisis Text Line at (250) 142-2667. ?Summary ?If you have fatigue, you feel tired all the time and have a lack of energy or a lack of motivation. ?Fatigue may make it  difficult to start or complete tasks because of exhaustion. ?Long-term (chronic) or extreme fatigue may be a symptom of a medical condition. ?Exercise regularly, as told by your health care provider. ?Change situations that cause you stress. Try to keep your work and personal schedules in balance. ?This information is not intended to replace advice given to you by your health care provider. Make sure you discuss any questions you have with your health care provider. ?Document Revised: 01/16/2021 Document Reviewed: 01/16/2021 ?Elsevier Patient Education ? [place]. ? ?

## 2021-08-15 NOTE — Assessment & Plan Note (Signed)
Supplement and monitor 

## 2021-08-15 NOTE — Assessment & Plan Note (Signed)
Mammogram ordered

## 2021-08-15 NOTE — Assessment & Plan Note (Signed)
Encourage heart healthy diet such as MIND or DASH diet, increase exercise, avoid trans fats, simple carbohydrates and processed foods, consider a krill or fish or flaxseed oil cap daily.  °

## 2021-08-15 NOTE — Progress Notes (Signed)
? ?Subjective:  ? ?By signing my name below, I, Zite Okoli, attest that this documentation has been prepared under the direction and in the presence of Bradd Canary, MD. 08/15/2021  ? ? ? Patient ID: Andrea Bowman, female    DOB: 1960-06-11, 61 y.o.   MRN: [id number] ? ?Chief Complaint  ?Patient presents with  ? Follow-up  ? ? ?HPI ?Patient is in today for an office visit. ? ?She reports that since she had Covid-19, she has been feeling fatigued. She wakes up often in the middle of the night and does not have trouble falling back asleep. She wakes up at 5-6 am and wants to go back to bed by 11 am because she feels tired through the day. Often wants to take naps through the day. She adds she normally feels rested after a night's rest. She occasionally wakes up with headaches and uses Advil to treat the, She admits she is a mouth breather and snores while sleeping. She is interested in a sleep study.  ? ?She smokes less than half a pack a day and it depends on her stress and anxiety levels. She has tried nicotine patches and gummies but they weren't effective.  ? ?She is not exercising regularly. She is interested in starting weight loss medications.  ? ?She had an episode of constipation that lasted for about 4 days. No history of traveling.  ? ? ?Past Medical History:  ?Diagnosis Date  ? Acute bronchitis 08/24/2010  ? Anxiety   ? panic attacks  ? 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 Complication of anesthesia   ? hard to wake up post-op  ? Dental bridge present   ? upper  ? Dental infection   ? will finish antibiotic 05/01/2012  ? Depression   ? Insomnia 11/14/2014  ? Osteoarthritis of knee   ? right  ? Perimenopausal   ? PVC (premature ventricular contraction)   ? takes Atenolol   ? TOBACCO ABUSE 11/30/2009  ? Qualifier: Diagnosis of  By: Jens Som, MD, Lyn Hollingshead   ? 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 ? ?Past Surgical History:  ?Procedure Laterality Date  ?  CHOLECYSTECTOMY  2007  ? KNEE ARTHROSCOPY WITH LATERAL RELEASE  05/02/2012  ? Procedure: KNEE ARTHROSCOPY WITH LATERAL RELEASE;  Surgeon: Harvie Junior, MD;  Location: [facility];  Service: Orthopedics;  Laterality: Right;  chondroplasty with lateral release  ? ? ?Family History  ?Problem Relation Age of Onset  ? Cancer Mother 81  ?     Breast  ? Breast cancer Mother 70  ? Hypertension Father   ? Heart failure Father   ?     CHF  ? Depression Sister   ?     previous history of  ? Anxiety disorder Sister   ?     Previous history of  ? Cancer Sister   ?     cancer, breast  ? Breast cancer Sister 51  ? Depression Brother   ? Anxiety disorder Brother   ? Ulcers Brother   ?     PUD  ? Cancer Brother   ?     skin  ? Cancer Maternal Aunt   ?     X several w/ breast cancer, various ages  ? Breast cancer Maternal Aunt   ? Cancer Paternal Uncle   ?     X several w/ prostate cancer, 1 w/colon cancer  ? Cancer  Maternal Aunt   ?     uterine  ? Breast cancer Maternal Aunt   ? Cancer Maternal Aunt   ?     breast  ? Breast cancer Maternal Aunt   ? ? ?Social History  ? ?Socioeconomic History  ? Marital status: Married  ?  Spouse name: Not on file  ? Number of children: 1  ? Years of education: Not on file  ? Highest education level: Not on file  ?Occupational History  ?  Comment: home  ?Tobacco Use  ? Smoking status: Every Day  ?  Packs/day: 0.50  ?  Years: 30.00  ?  Pack years: 15.00  ?  Types: Cigarettes  ? Smokeless tobacco: Never  ? Tobacco comments:  ?  3 cig./day and several e-cigarettes/day  ?Vaping Use  ? Vaping Use: Never used  ?Substance and Sexual Activity  ? Alcohol use: Yes  ?  Comment: seldom  ? Drug use: No  ? Sexual activity: Yes  ?  Partners: Male  ?  Comment: is separted from husband, he is out of the country and seeing someone else  ?Other Topics Concern  ? Not on file  ?Social History Narrative  ? Relocated from [place]. Lives with husband. Home maker.  ? ?Social Determinants of Health  ? ?Financial  Resource Strain: Not on file  ?Food Insecurity: Not on file  ?Transportation Needs: Not on file  ?Physical Activity: Not on file  ?Stress: Not on file  ?Social Connections: Not on file  ?Intimate Partner Violence: Not on file  ? ? ?Outpatient Medications Prior to Visit  ?Medication Sig Dispense Refill  ? acyclovir cream (ZOVIRAX) 5 % Apply 1 application topically every 3 (three) hours. 5 g 2  ? albuterol (PROVENTIL) (2.5 MG/3ML) 0.083% nebulizer solution Take 3 mLs (2.5 mg total) by nebulization every 6 (six) hours as needed for wheezing or shortness of breath. 150 mL 1  ? albuterol (VENTOLIN HFA) 108 (90 Base) MCG/ACT inhaler Inhale 2 puffs into the lungs every 6 (six) hours as needed for wheezing or shortness of breath. 18 g 5  ? atenolol (TENORMIN) 25 MG tablet TAKE 1 TABLET (25 MG TOTAL) BY MOUTH DAILY. 90 tablet 0  ? clonazePAM (KLONOPIN) 0.5 MG tablet TAKE 1 TABLET BY MOUTH THREE TIMES A DAY AS NEEDED FOR ANXIETY 60 tablet 2  ? escitalopram (LEXAPRO) 20 MG tablet Take 2 tablets (40 mg total) by mouth daily. 180 tablet 1  ? Esomeprazole Magnesium (NEXIUM PO) Take by mouth.    ? ipratropium (ATROVENT HFA) 17 MCG/ACT inhaler Inhale 2 puffs into the lungs every 6 (six) hours as needed for wheezing. 12.9 g 5  ? QVAR REDIHALER 80 MCG/ACT inhaler TAKE 1 PUFF BY MOUTH TWICE A DAY 10.6 g 1  ? cholecalciferol (VITAMIN D) 1000 units tablet Take 1,000 Units by mouth 2 (two) times daily.    ? ibuprofen (ADVIL,MOTRIN) 200 MG tablet Take 600 mg by mouth every 6 (six) hours as needed for pain.    ? nicotine (NICODERM CQ - DOSED IN MG/24 HOURS) 14 mg/24hr patch Place 1 patch (14 mg total) onto the skin daily. 28 patch 0  ? phenazopyridine (PYRIDIUM) 200 MG tablet Take 1 tablet (200 mg total) by mouth 3 (three) times daily as needed for pain. 6 tablet 0  ? predniSONE (DELTASONE) 20 MG tablet Take 2 tablets by mouth daily for 4 days 8 tablet 0  ? sulfamethoxazole-trimethoprim (BACTRIM DS) 800-160 MG tablet Take 1 tablet by  mouth  2 (two) times daily. 20 tablet 0  ? tiZANidine (ZANAFLEX) 4 MG tablet Take 0.5-1 tablets (2-4 mg total) by mouth every 8 (eight) hours as needed for muscle spasms. 90 tablet 1  ? vitamin B-12 (CYANOCOBALAMIN) 1000 MCG tablet Take 1,000 mcg by mouth daily.    ? ?No facility-administered medications prior to visit.  ? ? ?Allergies  ?Allergen Reactions  ? Cefdinir Hives  ? Doxycycline Diarrhea  ? Prednisone Other (See Comments)  ?  REACTION: REDNESS OF FACE  ? ? ?Review of Systems  ?Constitutional:  Positive for malaise/fatigue. Negative for fever.  ?     (+) snoring   ?HENT:  Negative for congestion.   ?Eyes:  Negative for redness.  ?Respiratory:  Negative for shortness of breath.   ?Cardiovascular:  Negative for chest pain, palpitations and leg swelling.  ?Gastrointestinal:  Negative for abdominal pain, blood in stool and nausea.  ?Genitourinary:  Negative for dysuria and frequency.  ?Musculoskeletal:  Negative for falls.  ?Skin:  Negative for rash.  ?Neurological:  Positive for headaches. Negative for dizziness and loss of consciousness.  ?Endo/Heme/Allergies:  Negative for polydipsia.  ?Psychiatric/Behavioral:  Negative for depression. The patient is not nervous/anxious.   ? ?   ?Objective:  ?  ?Physical Exam ?Constitutional:   ?   General: She is not in acute distress. ?   Appearance: She is well-developed.  ?HENT:  ?   Head: Normocephalic and atraumatic.  ?Eyes:  ?   Conjunctiva/sclera: Conjunctivae normal.  ?Neck:  ?   Thyroid: No thyromegaly.  ?Cardiovascular:  ?   Rate and Rhythm: Normal rate and regular rhythm.  ?   Heart sounds: Normal heart sounds. No murmur heard. ?Pulmonary:  ?   Effort: Pulmonary effort is normal. No respiratory distress.  ?   Breath sounds: Normal breath sounds.  ?Abdominal:  ?   General: Bowel sounds are normal. There is no distension.  ?   Palpations: Abdomen is soft. There is no mass.  ?   Tenderness: There is no abdominal tenderness.  ?Musculoskeletal:  ?   Cervical back: Neck  supple.  ?Lymphadenopathy:  ?   Cervical: No cervical adenopathy.  ?Skin: ?   General: Skin is warm and dry.  ?Neurological:  ?   Mental Status: She is alert and oriented to person, place, and time.  ?Psy

## 2021-08-15 NOTE — Telephone Encounter (Signed)
Prior Berkley Harvey was started for [facility] 0.25MG /0.5ML auto-injectors ? ?Message from Plan ?Drug is covered by current benefit plan. No further PA activity needed ?

## 2021-08-15 NOTE — Assessment & Plan Note (Signed)
Less than 1/2 ppd encouraged complete cessation.  ?

## 2021-08-15 NOTE — Assessment & Plan Note (Signed)
Encouraged DASH or MIND diet, decrease po intake and increase exercise as tolerated. Needs 7-8 hours of sleep nightly. Avoid trans fats, eat small, frequent meals every 4-5 hours with lean proteins, complex carbs and healthy fats. Minimize simple carbs, high fat foods and processed foods 

## 2021-08-17 ENCOUNTER — Ambulatory Visit: Payer: Managed Care, Other (non HMO) | Admitting: Family Medicine

## 2021-08-17 ENCOUNTER — Other Ambulatory Visit (HOSPITAL_BASED_OUTPATIENT_CLINIC_OR_DEPARTMENT_OTHER): Payer: Self-pay

## 2021-08-18 ENCOUNTER — Other Ambulatory Visit (HOSPITAL_BASED_OUTPATIENT_CLINIC_OR_DEPARTMENT_OTHER): Payer: Self-pay

## 2021-08-18 LAB — DRUG TOX MONITOR 1 W/CONF, ORAL FLD
Alprazolam: NEGATIVE ng/mL (ref <0.50)
Amphetamines: NEGATIVE ng/mL (ref <10)
Barbiturates: NEGATIVE ng/mL (ref <10)
Benzodiazepines: POSITIVE ng/mL — AB (ref <0.50)
Benzoylecgonine: NEGATIVE ng/mL (ref <5.0)
Buprenorphine: NEGATIVE ng/mL (ref <0.10)
Chlordiazepoxide: NEGATIVE ng/mL (ref <0.50)
Clonazepam: 0.72 ng/mL — ABNORMAL HIGH (ref <0.50)
Cocaine: NEGATIVE ng/mL (ref <5.0)
Cocaine: NEGATIVE ng/mL (ref <5.0)
Cotinine: >250 ng/mL — ABNORMAL HIGH (ref <5.0)
Diazepam: NEGATIVE ng/mL (ref <0.50)
Fentanyl: NEGATIVE ng/mL (ref <0.10)
Flunitrazepam: NEGATIVE ng/mL (ref <0.50)
Flurazepam: NEGATIVE ng/mL (ref <0.50)
Heroin Metabolite: NEGATIVE ng/mL (ref <1.0)
Lorazepam: NEGATIVE ng/mL (ref <0.50)
MARIJUANA: NEGATIVE ng/mL (ref <2.5)
MDMA: NEGATIVE ng/mL (ref <10)
Meprobamate: NEGATIVE ng/mL (ref <2.5)
Methadone: NEGATIVE ng/mL (ref <5.0)
Midazolam: NEGATIVE ng/mL (ref <0.50)
Nicotine Metabolite: POSITIVE ng/mL — AB (ref <5.0)
Nordiazepam: NEGATIVE ng/mL (ref <0.50)
Opiates: NEGATIVE ng/mL (ref <2.5)
Oxazepam: NEGATIVE ng/mL (ref <0.50)
Phencyclidine: NEGATIVE ng/mL (ref <10)
Tapentadol: NEGATIVE ng/mL (ref <5.0)
Temazepam: NEGATIVE ng/mL (ref <0.50)
Tramadol: NEGATIVE ng/mL (ref <5.0)
Triazolam: NEGATIVE ng/mL (ref <0.50)
Zolpidem: NEGATIVE ng/mL (ref <5.0)

## 2021-09-06 ENCOUNTER — Ambulatory Visit (HOSPITAL_BASED_OUTPATIENT_CLINIC_OR_DEPARTMENT_OTHER)
Admission: RE | Admit: 2021-09-06 | Discharge: 2021-09-06 | Disposition: A | Discharge status: Home / Self Care | Payer: Managed Care, Other (non HMO) | Source: Ambulatory Visit | Attending: Family Medicine | Admitting: Family Medicine

## 2021-09-06 ENCOUNTER — Encounter (HOSPITAL_BASED_OUTPATIENT_CLINIC_OR_DEPARTMENT_OTHER): Payer: Self-pay

## 2021-09-06 DIAGNOSIS — Z1239 Encounter for other screening for malignant neoplasm of breast: Secondary | ICD-10-CM

## 2021-09-06 DIAGNOSIS — Z1231 Encounter for screening mammogram for malignant neoplasm of breast: Secondary | ICD-10-CM | POA: Diagnosis present

## 2021-10-01 ENCOUNTER — Other Ambulatory Visit: Payer: Self-pay | Admitting: Family Medicine

## 2021-10-01 ENCOUNTER — Encounter: Payer: Self-pay | Admitting: Family Medicine

## 2021-10-01 DIAGNOSIS — F341 Dysthymic disorder: Secondary | ICD-10-CM

## 2021-10-02 ENCOUNTER — Other Ambulatory Visit: Payer: Self-pay

## 2021-10-02 ENCOUNTER — Other Ambulatory Visit (HOSPITAL_BASED_OUTPATIENT_CLINIC_OR_DEPARTMENT_OTHER): Payer: Self-pay

## 2021-10-02 DIAGNOSIS — F419 Anxiety disorder, unspecified: Secondary | ICD-10-CM

## 2021-10-02 MED ORDER — ATENOLOL 25 MG PO TABS
25.0000 mg | ORAL_TABLET | Freq: Every day | ORAL | 0 refills | Status: DC
  Filled 2021-10-02: qty 90, 90d supply, fill #0

## 2021-10-02 MED ORDER — CLONAZEPAM 0.5 MG PO TABS
ORAL_TABLET | ORAL | 2 refills | Status: DC
  Filled 2021-10-02: qty 60, 20d supply, fill #0
  Filled 2021-11-06: qty 60, 20d supply, fill #1

## 2021-10-02 MED ORDER — ESCITALOPRAM OXALATE 20 MG PO TABS
40.0000 mg | ORAL_TABLET | Freq: Every day | ORAL | 1 refills | Status: AC
  Filled 2021-10-02: qty 180, 90d supply, fill #0

## 2021-10-02 NOTE — Telephone Encounter (Signed)
Requesting: clonazepam 0.5 Contract: 08/15/21 UDS: 08/15/21 Last Visit: 08/15/21 Next Visit: 11/21/21 Last Refill: 04/11/21  Please Advise

## 2021-10-05 ENCOUNTER — Other Ambulatory Visit: Payer: Self-pay | Admitting: Family Medicine

## 2021-10-05 ENCOUNTER — Other Ambulatory Visit (HOSPITAL_BASED_OUTPATIENT_CLINIC_OR_DEPARTMENT_OTHER): Payer: Self-pay

## 2021-10-05 ENCOUNTER — Other Ambulatory Visit: Payer: Self-pay

## 2021-10-05 MED ORDER — ATENOLOL 25 MG PO TABS
25.0000 mg | ORAL_TABLET | Freq: Every day | ORAL | 1 refills | Status: DC
  Filled 2021-10-05: qty 90, 90d supply, fill #0

## 2021-10-25 NOTE — Progress Notes (Signed)
   ANNUAL EXAM Patient name: Andrea Bowman MRN [id number]  Date of birth: 06-21-1960 Chief Complaint:   Annual Exam  History of Present Illness:   Andrea Bowman is a 61 y.o. G1 female being seen today for a routine annual exam.   Current complaints: None  Not currently working - her husband did security updates for [employer] so they travelled the world and she ran the household and still does.   No LMP recorded. Patient is postmenopausal.  Last pap: 2014. Results were: NILM w/ HRHPV negative. H/O abnormal pap: no Last MXR: 09/07/2021 Colonoscopy 2017  Review of Systems:   Pertinent items are noted in HPI Denies any headaches, blurred vision, fatigue, shortness of breath, chest pain, abdominal pain, abnormal vaginal discharge/itching/odor/irritation, problems with periods, bowel movements, urination, or intercourse unless otherwise stated above.  Pertinent History Reviewed:  Reviewed past medical,surgical, social and family history.  Reviewed problem list, medications and allergies. Physical Assessment:   Vitals:   10/30/21 0901  BP: 126/69  Pulse: 63  Weight: 198 lb (89.8 kg)  Height: 5\' 7"  (1.702 m)  Body mass index is 31.01 kg/m.   Physical Examination:  General appearance - well appearing, and in no distress Mental status - alert, oriented to person, place, and time Psych:  She has a normal mood and affect Skin - warm and dry, normal color, no suspicious lesions noted Chest - effort normal, all lung fields clear to auscultation bilaterally Heart - normal rate and regular rhythm Neck:  midline trachea, no thyromegaly or nodules Breasts - breasts appear normal, no suspicious masses, no skin or nipple changes or axillary nodes Abdomen - soft, nontender, nondistended, no masses or organomegaly Pelvic -  VULVA: normal appearing vulva with no masses, tenderness or lesions  VAGINA: normal appearing vagina with normal color and discharge, no lesions  CERVIX: normal appearing  cervix without discharge or lesions, no CMT UTERUS: uterus is felt to be normal size, shape, consistency and nontender  ADNEXA: No adnexal masses or tenderness noted. Extremities:  No swelling or varicosities noted  Chaperone present for exam  No results found for this or any previous visit (from the past 24 hour(s)).  Assessment & Plan:  Andrea Bowman was seen today for annual exam.  Diagnoses and all orders for this visit:  Encounter for annual routine gynecological examination -     Cytology - PAP( [facility])  - Cervical cancer screening: Discussed guidelines. Pap with HPV done - Breast Health: Encouraged self breast awareness/SBE. Discussed limits of clinical breast exam for detecting breast cancer. Discussed importance of annual MXR.  MXR up to date - Climacteric/Sexual health: Reviewed typical and atypical symptoms of menopause/peri-menopause. Discussed PMB and to call if any amount of spotting.  - Bone Health: Calcium via diet and supplementation. Discussed weight bearing exercise - Colonoscopy: up to date - done in 2017 - F/U 12 months and prn     No orders of the defined types were placed in this encounter.   Meds: No orders of the defined types were placed in this encounter.   Follow-up: No follow-ups on file.  2018, MD 10/30/2021 9:20 AM

## 2021-10-30 ENCOUNTER — Other Ambulatory Visit (HOSPITAL_COMMUNITY)
Admission: RE | Admit: 2021-10-30 | Discharge: 2021-10-30 | Disposition: A | Discharge status: Home / Self Care | Payer: Managed Care, Other (non HMO) | Source: Ambulatory Visit | Attending: Obstetrics and Gynecology | Admitting: Obstetrics and Gynecology

## 2021-10-30 ENCOUNTER — Ambulatory Visit (INDEPENDENT_AMBULATORY_CARE_PROVIDER_SITE_OTHER): Payer: Managed Care, Other (non HMO) | Admitting: Obstetrics and Gynecology

## 2021-10-30 ENCOUNTER — Encounter: Payer: Self-pay | Admitting: Obstetrics and Gynecology

## 2021-10-30 VITALS — BP 126/69 | HR 63 | Ht 67.0 in | Wt 198.0 lb

## 2021-10-30 DIAGNOSIS — Z01419 Encounter for gynecological examination (general) (routine) without abnormal findings: Secondary | ICD-10-CM | POA: Insufficient documentation

## 2021-11-01 ENCOUNTER — Other Ambulatory Visit (HOSPITAL_BASED_OUTPATIENT_CLINIC_OR_DEPARTMENT_OTHER): Payer: Self-pay

## 2021-11-01 ENCOUNTER — Encounter: Payer: Self-pay | Admitting: Obstetrics and Gynecology

## 2021-11-01 DIAGNOSIS — B3731 Acute candidiasis of vulva and vagina: Secondary | ICD-10-CM

## 2021-11-01 DIAGNOSIS — N3000 Acute cystitis without hematuria: Secondary | ICD-10-CM

## 2021-11-01 LAB — CYTOLOGY - PAP
Comment: NEGATIVE
Diagnosis: NEGATIVE
High risk HPV: NEGATIVE

## 2021-11-01 MED ORDER — FLUCONAZOLE 150 MG PO TABS
150.0000 mg | ORAL_TABLET | ORAL | 3 refills | Status: AC
  Filled 2021-11-01: qty 3, 9d supply, fill #0

## 2021-11-06 ENCOUNTER — Other Ambulatory Visit (HOSPITAL_BASED_OUTPATIENT_CLINIC_OR_DEPARTMENT_OTHER): Payer: Self-pay

## 2021-11-21 ENCOUNTER — Ambulatory Visit: Payer: Managed Care, Other (non HMO) | Admitting: Family Medicine

## 2022-01-22 ENCOUNTER — Other Ambulatory Visit (HOSPITAL_BASED_OUTPATIENT_CLINIC_OR_DEPARTMENT_OTHER): Payer: Self-pay

## 2022-03-23 ENCOUNTER — Encounter: Payer: Self-pay | Admitting: Family Medicine

## 2022-03-23 DIAGNOSIS — F341 Dysthymic disorder: Secondary | ICD-10-CM

## 2022-03-23 MED ORDER — ATENOLOL 25 MG PO TABS: 25.0000 mg | ORAL_TABLET | Freq: Every day | ORAL | 0 refills | Status: AC

## 2022-03-23 MED ORDER — CLONAZEPAM 0.5 MG PO TABS: ORAL_TABLET | ORAL | 0 refills | Status: AC

## 2022-03-23 NOTE — Telephone Encounter (Signed)
Requesting: clonazepam 0.5mg  Contract: none UDS: 08/15/21 Last Visit: 08/15/21 Next Visit: none Last Refill: 10/02/21  Atenolol already sent in.  Please Advise

## 2022-04-12 ENCOUNTER — Encounter: Payer: Self-pay | Admitting: Family Medicine

## 2022-04-12 MED ORDER — SULFAMETHOXAZOLE-TRIMETHOPRIM 800-160 MG PO TABS: 1.0000 | ORAL_TABLET | Freq: Two times a day (BID) | ORAL | 0 refills | Status: AC

## 2022-04-12 NOTE — Addendum Note (Signed)
Addended by: Radene Gunning A on: 04/12/2022 04:28 PM   Modules accepted: Orders

## 2022-09-12 ENCOUNTER — Other Ambulatory Visit: Payer: Self-pay

## 2022-09-12 ENCOUNTER — Telehealth: Payer: Self-pay | Admitting: Family Medicine

## 2022-09-12 DIAGNOSIS — Z1239 Encounter for other screening for malignant neoplasm of breast: Secondary | ICD-10-CM

## 2022-09-12 NOTE — Telephone Encounter (Signed)
Called pt was advised the order was placed for mammogram screening. Pt advised she can call to make the appt.

## 2022-09-12 NOTE — Telephone Encounter (Signed)
Pt would like order placed for mammogram. She was trying to get it done today. Advised it may not happen today between the order being placed and the availability of the imaging dept. Transferred her to imaging to see if they could even see her today. Please place mmg order and call pt to advise

## 2022-11-24 IMAGING — MG MM DIGITAL SCREENING BILAT W/ TOMO AND CAD
8 series · 8 of 24 positions shown · non-contrast
Comparison: Previous exam(s).

CLINICAL DATA: Screening.

EXAM:
DIGITAL SCREENING BILATERAL MAMMOGRAM WITH TOMOSYNTHESIS AND CAD
TECHNIQUE: Bilateral screening digital craniocaudal and mediolateral oblique
mammograms were obtained. Bilateral screening digital breast
tomosynthesis was performed. The images were evaluated with
computer-aided detection.

[L MLO synth-2D]
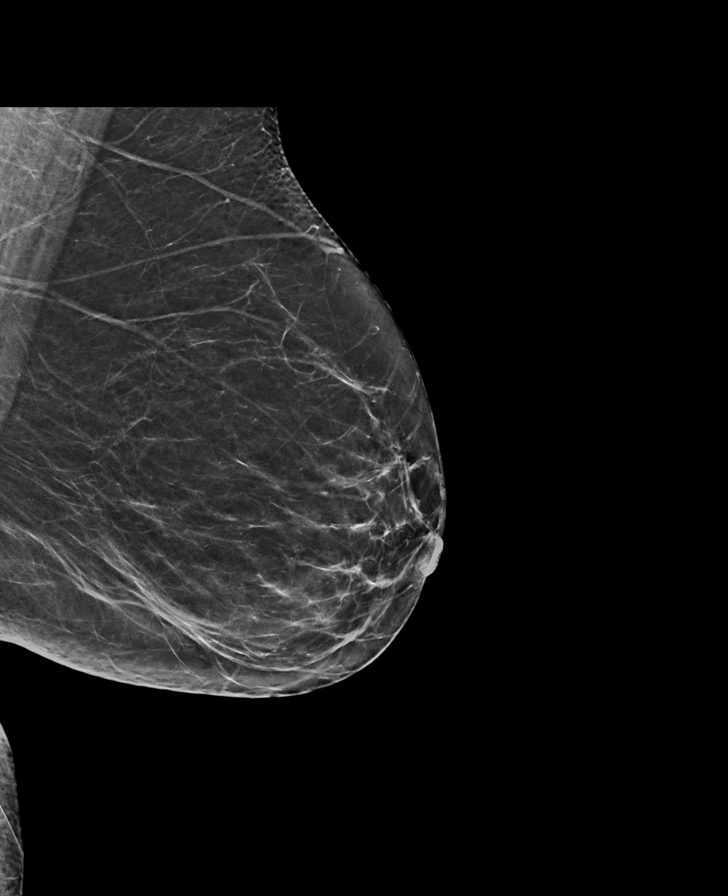

[L CC synth-2D]
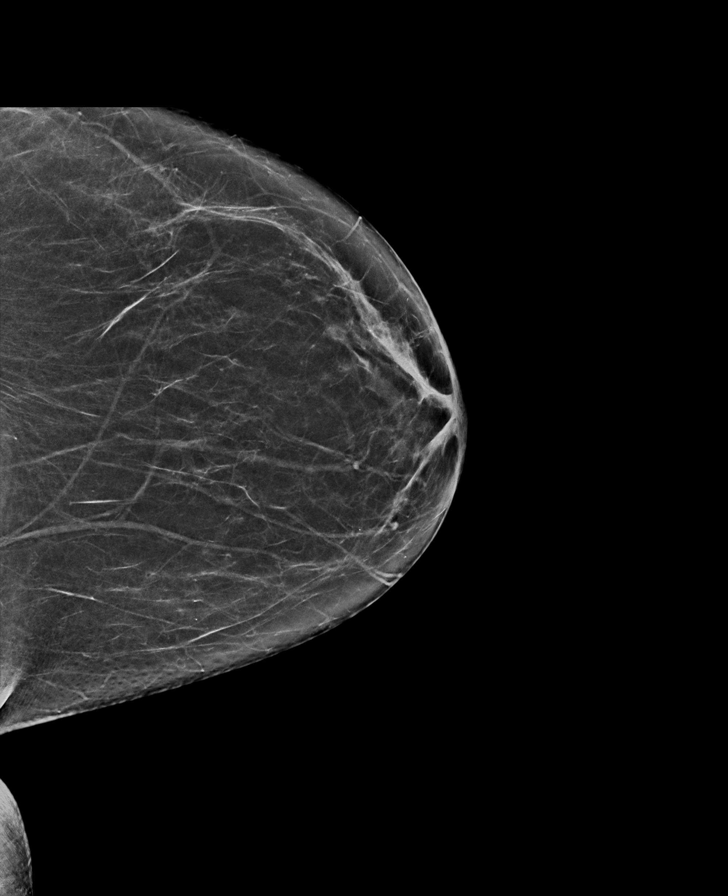

[R CC synth-2D]
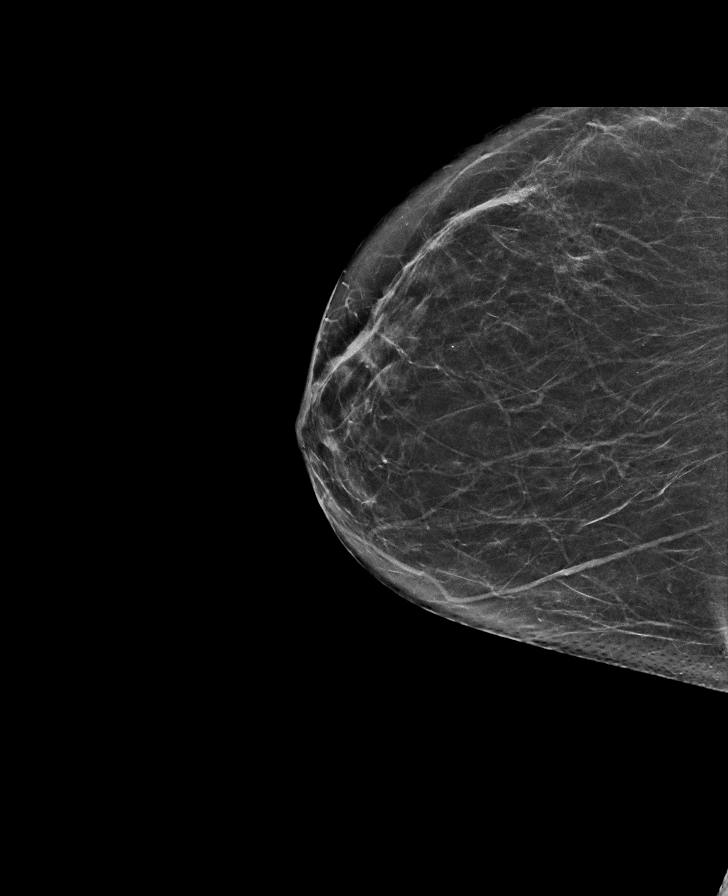

[R MLO synth-2D]
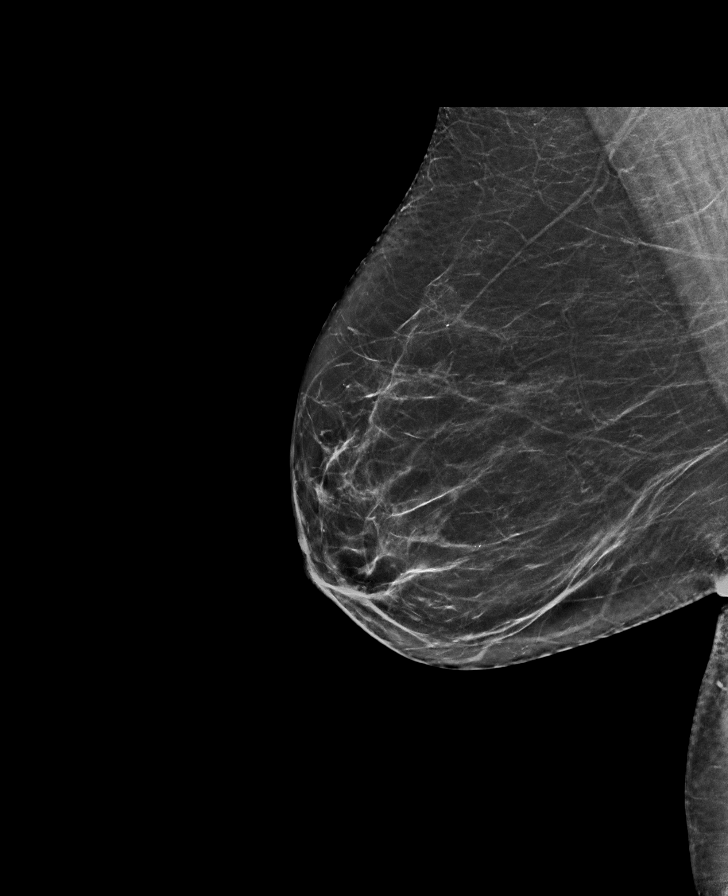

[R CC tomo · tomo slice 29/58.0]
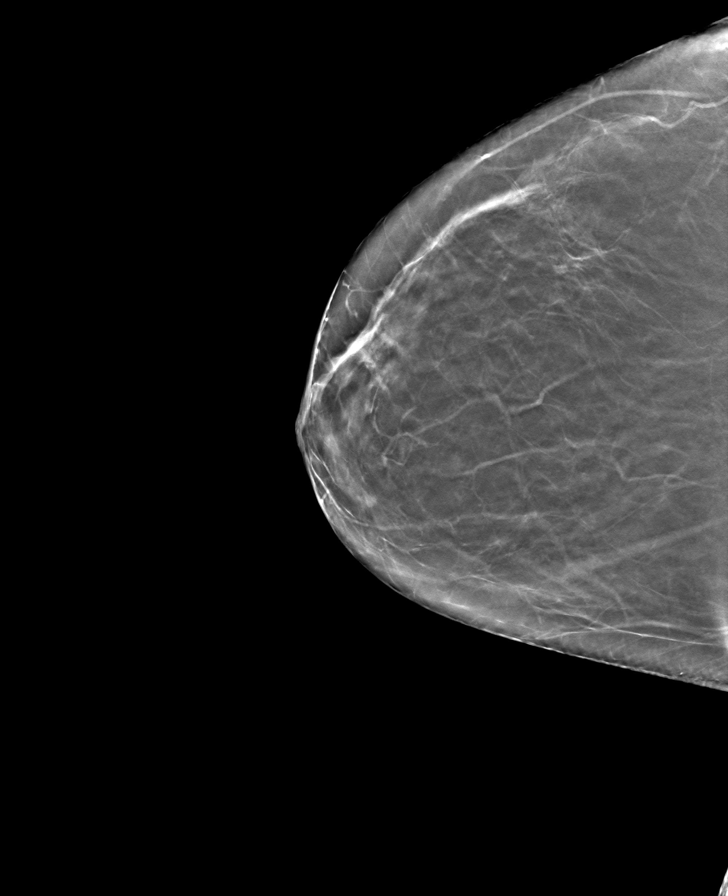

[R MLO tomo · tomo slice 33/65.0]
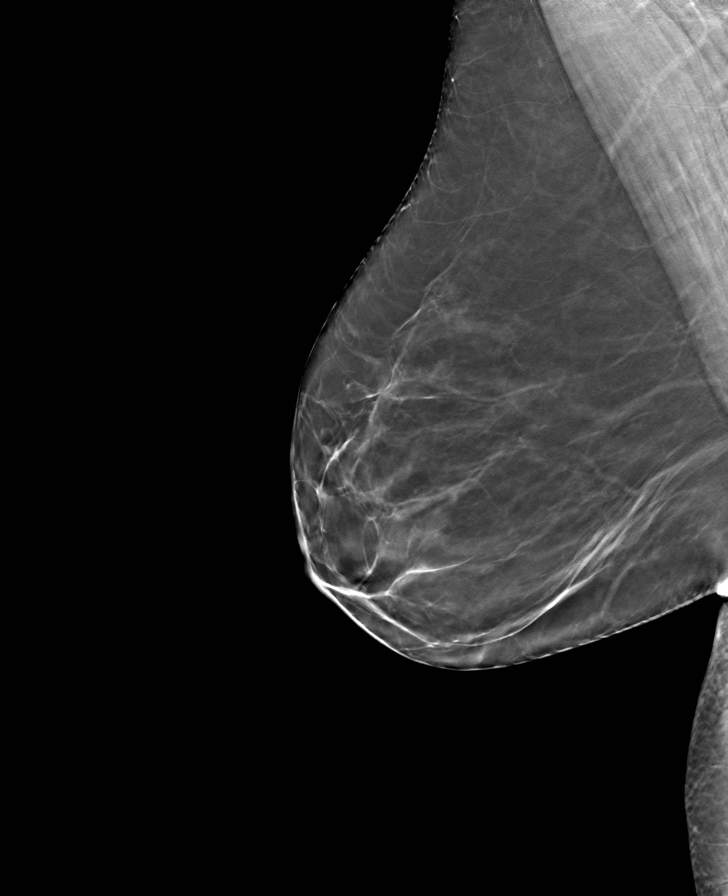

[L CC tomo · tomo slice 31/61.0]
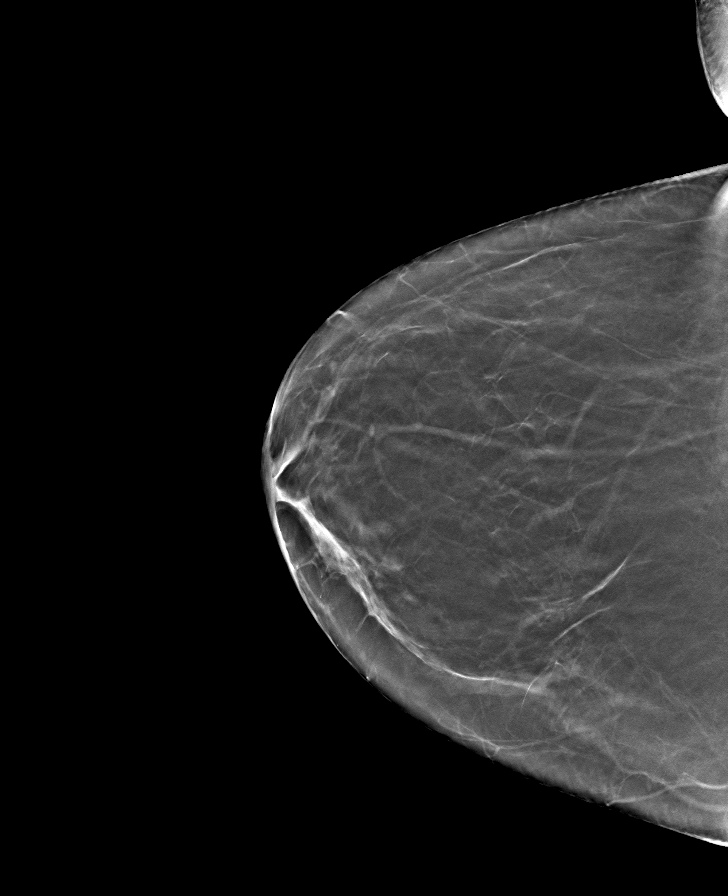

[L MLO tomo · tomo slice 33/64.0]
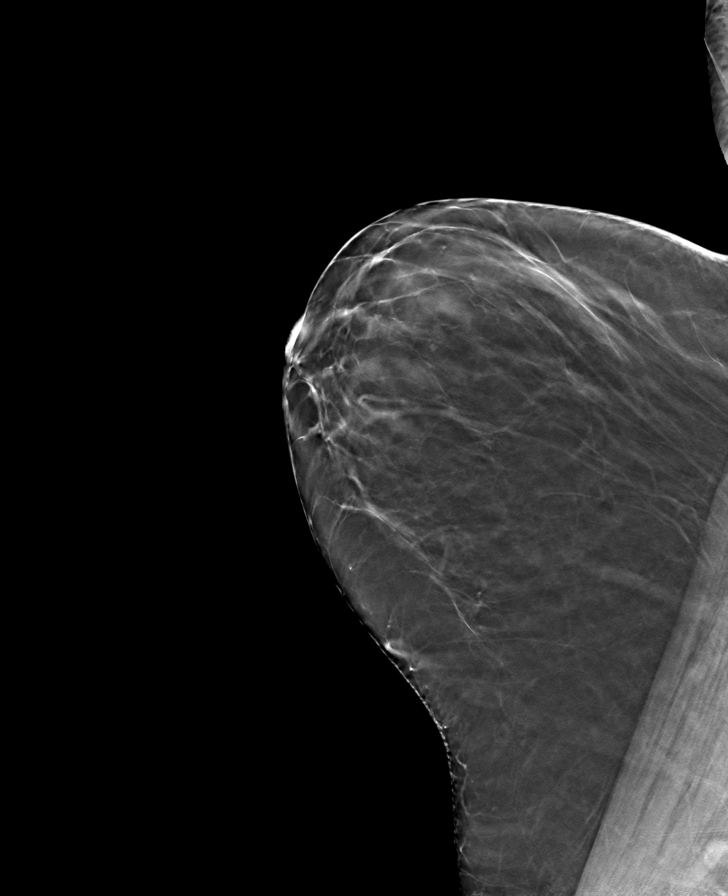

[8 of 24 positions shown; findings below may reference images not displayed]

ACR Breast Density Category b: There are scattered areas of
fibroglandular density.
FINDINGS: There are no findings suspicious for malignancy.
IMPRESSION: No mammographic evidence of malignancy. A result letter of this
screening mammogram will be mailed directly to the patient.

RECOMMENDATION:
Screening mammogram in one year. (Code:51-O-LD2)

BI-RADS CATEGORY  1: Negative.
